# Patient Record
Sex: Male | Born: 1970 | Race: White | Hispanic: No | Marital: Married | Smoking: Former smoker
Health system: Southern US, Community
[De-identification: ages and names within clinical notes are randomized; demographics above are authoritative.]

## PROBLEM LIST (undated history)

## (undated) DIAGNOSIS — E785 Hyperlipidemia, unspecified: Secondary | ICD-10-CM

## (undated) DIAGNOSIS — D496 Neoplasm of unspecified behavior of brain: Secondary | ICD-10-CM

## (undated) DIAGNOSIS — I10 Essential (primary) hypertension: Secondary | ICD-10-CM

## (undated) DIAGNOSIS — G4730 Sleep apnea, unspecified: Secondary | ICD-10-CM

## (undated) DIAGNOSIS — M75100 Unspecified rotator cuff tear or rupture of unspecified shoulder, not specified as traumatic: Secondary | ICD-10-CM

## (undated) HISTORY — PX: ABDOMINAL SURGERY: SHX537

## (undated) HISTORY — PX: CHOLECYSTECTOMY: SHX55

## (undated) HISTORY — PX: ROTATOR CUFF REPAIR: SHX139

## (undated) HISTORY — PX: SINUS SURGERY WITH INSTATRAK: SHX5215

## (undated) HISTORY — PX: BRAIN SURGERY: SHX531

---

## 2001-08-12 ENCOUNTER — Emergency Department (HOSPITAL_COMMUNITY): Admission: EM | Admit: 2001-08-12 | Discharge: 2001-08-12 | Payer: Self-pay | Admitting: *Deleted

## 2001-08-12 ENCOUNTER — Encounter: Payer: Self-pay | Admitting: Emergency Medicine

## 2001-08-15 ENCOUNTER — Emergency Department (HOSPITAL_COMMUNITY): Admission: EM | Admit: 2001-08-15 | Discharge: 2001-08-15 | Payer: Self-pay | Admitting: Emergency Medicine

## 2001-08-18 ENCOUNTER — Encounter: Payer: Self-pay | Admitting: Emergency Medicine

## 2001-08-18 ENCOUNTER — Ambulatory Visit (HOSPITAL_COMMUNITY): Admission: RE | Admit: 2001-08-18 | Discharge: 2001-08-18 | Payer: Self-pay | Admitting: Emergency Medicine

## 2001-12-20 ENCOUNTER — Emergency Department (HOSPITAL_COMMUNITY): Admission: EM | Admit: 2001-12-20 | Discharge: 2001-12-20 | Payer: Self-pay | Admitting: Emergency Medicine

## 2002-02-15 ENCOUNTER — Emergency Department (HOSPITAL_COMMUNITY): Admission: EM | Admit: 2002-02-15 | Discharge: 2002-02-15 | Payer: Self-pay | Admitting: Emergency Medicine

## 2002-04-21 ENCOUNTER — Emergency Department (HOSPITAL_COMMUNITY): Admission: EM | Admit: 2002-04-21 | Discharge: 2002-04-21 | Payer: Self-pay | Admitting: Emergency Medicine

## 2003-02-25 ENCOUNTER — Encounter: Payer: Self-pay | Admitting: Emergency Medicine

## 2003-02-25 ENCOUNTER — Emergency Department (HOSPITAL_COMMUNITY): Admission: EM | Admit: 2003-02-25 | Discharge: 2003-02-25 | Payer: Self-pay | Admitting: Emergency Medicine

## 2003-03-05 ENCOUNTER — Encounter: Payer: Self-pay | Admitting: Orthopaedic Surgery

## 2003-03-05 ENCOUNTER — Ambulatory Visit (HOSPITAL_COMMUNITY): Admission: RE | Admit: 2003-03-05 | Discharge: 2003-03-05 | Payer: Self-pay | Admitting: Orthopaedic Surgery

## 2003-05-05 ENCOUNTER — Emergency Department (HOSPITAL_COMMUNITY): Admission: EM | Admit: 2003-05-05 | Discharge: 2003-05-05 | Payer: Self-pay | Admitting: Emergency Medicine

## 2003-05-10 ENCOUNTER — Emergency Department (HOSPITAL_COMMUNITY): Admission: EM | Admit: 2003-05-10 | Discharge: 2003-05-11 | Payer: Self-pay | Admitting: Emergency Medicine

## 2003-05-15 ENCOUNTER — Emergency Department (HOSPITAL_COMMUNITY): Admission: EM | Admit: 2003-05-15 | Discharge: 2003-05-15 | Payer: Self-pay | Admitting: *Deleted

## 2003-05-15 ENCOUNTER — Inpatient Hospital Stay (HOSPITAL_COMMUNITY): Admission: AD | Admit: 2003-05-15 | Discharge: 2003-05-20 | Payer: Self-pay | Admitting: General Surgery

## 2003-05-15 ENCOUNTER — Encounter: Payer: Self-pay | Admitting: *Deleted

## 2003-05-16 ENCOUNTER — Encounter: Payer: Self-pay | Admitting: General Surgery

## 2003-05-31 ENCOUNTER — Encounter: Payer: Self-pay | Admitting: Otolaryngology

## 2003-05-31 ENCOUNTER — Ambulatory Visit (HOSPITAL_COMMUNITY): Admission: RE | Admit: 2003-05-31 | Discharge: 2003-05-31 | Payer: Self-pay | Admitting: Otolaryngology

## 2003-07-27 ENCOUNTER — Emergency Department (HOSPITAL_COMMUNITY): Admission: EM | Admit: 2003-07-27 | Discharge: 2003-07-28 | Payer: Self-pay | Admitting: Emergency Medicine

## 2003-08-16 ENCOUNTER — Emergency Department (HOSPITAL_COMMUNITY): Admission: EM | Admit: 2003-08-16 | Discharge: 2003-08-16 | Payer: Self-pay | Admitting: Emergency Medicine

## 2003-08-24 ENCOUNTER — Emergency Department (HOSPITAL_COMMUNITY): Admission: EM | Admit: 2003-08-24 | Discharge: 2003-08-24 | Payer: Self-pay | Admitting: Emergency Medicine

## 2003-09-28 ENCOUNTER — Emergency Department (HOSPITAL_COMMUNITY): Admission: EM | Admit: 2003-09-28 | Discharge: 2003-09-29 | Payer: Self-pay | Admitting: *Deleted

## 2003-11-10 ENCOUNTER — Emergency Department (HOSPITAL_COMMUNITY): Admission: EM | Admit: 2003-11-10 | Discharge: 2003-11-10 | Payer: Self-pay | Admitting: *Deleted

## 2004-02-02 ENCOUNTER — Emergency Department (HOSPITAL_COMMUNITY): Admission: EM | Admit: 2004-02-02 | Discharge: 2004-02-02 | Payer: Self-pay | Admitting: Emergency Medicine

## 2004-02-22 ENCOUNTER — Emergency Department (HOSPITAL_COMMUNITY): Admission: EM | Admit: 2004-02-22 | Discharge: 2004-02-22 | Payer: Self-pay | Admitting: Emergency Medicine

## 2004-03-28 ENCOUNTER — Emergency Department (HOSPITAL_COMMUNITY): Admission: EM | Admit: 2004-03-28 | Discharge: 2004-03-28 | Payer: Self-pay | Admitting: Emergency Medicine

## 2004-04-06 ENCOUNTER — Emergency Department (HOSPITAL_COMMUNITY): Admission: EM | Admit: 2004-04-06 | Discharge: 2004-04-06 | Payer: Self-pay | Admitting: Emergency Medicine

## 2004-04-07 ENCOUNTER — Emergency Department (HOSPITAL_COMMUNITY): Admission: EM | Admit: 2004-04-07 | Discharge: 2004-04-08 | Payer: Self-pay | Admitting: Emergency Medicine

## 2004-05-09 ENCOUNTER — Emergency Department (HOSPITAL_COMMUNITY): Admission: EM | Admit: 2004-05-09 | Discharge: 2004-05-09 | Payer: Self-pay | Admitting: Emergency Medicine

## 2004-07-13 ENCOUNTER — Emergency Department (HOSPITAL_COMMUNITY): Admission: EM | Admit: 2004-07-13 | Discharge: 2004-07-13 | Payer: Self-pay | Admitting: Emergency Medicine

## 2004-08-29 ENCOUNTER — Ambulatory Visit (HOSPITAL_COMMUNITY): Admission: RE | Admit: 2004-08-29 | Discharge: 2004-08-29 | Payer: Self-pay | Admitting: Specialist

## 2004-09-30 ENCOUNTER — Emergency Department (HOSPITAL_COMMUNITY): Admission: EM | Admit: 2004-09-30 | Discharge: 2004-09-30 | Payer: Self-pay | Admitting: Emergency Medicine

## 2006-06-03 ENCOUNTER — Emergency Department (HOSPITAL_COMMUNITY): Admission: EM | Admit: 2006-06-03 | Discharge: 2006-06-03 | Payer: Self-pay | Admitting: Family Medicine

## 2007-11-19 ENCOUNTER — Emergency Department (HOSPITAL_COMMUNITY): Admission: EM | Admit: 2007-11-19 | Discharge: 2007-11-19 | Payer: Self-pay | Admitting: Emergency Medicine

## 2008-02-22 ENCOUNTER — Emergency Department (HOSPITAL_COMMUNITY): Admission: EM | Admit: 2008-02-22 | Discharge: 2008-02-22 | Payer: Self-pay | Admitting: Emergency Medicine

## 2008-08-26 ENCOUNTER — Encounter: Payer: Self-pay | Admitting: Orthopedic Surgery

## 2008-08-26 ENCOUNTER — Emergency Department (HOSPITAL_COMMUNITY): Admission: EM | Admit: 2008-08-26 | Discharge: 2008-08-26 | Payer: Self-pay | Admitting: Emergency Medicine

## 2008-08-27 ENCOUNTER — Telehealth: Payer: Self-pay | Admitting: Orthopedic Surgery

## 2008-08-27 ENCOUNTER — Ambulatory Visit: Payer: Self-pay | Admitting: Orthopedic Surgery

## 2008-08-30 ENCOUNTER — Telehealth: Payer: Self-pay | Admitting: Orthopedic Surgery

## 2008-08-31 ENCOUNTER — Ambulatory Visit (HOSPITAL_COMMUNITY): Admission: RE | Admit: 2008-08-31 | Discharge: 2008-08-31 | Payer: Self-pay | Admitting: Orthopedic Surgery

## 2008-09-05 ENCOUNTER — Ambulatory Visit: Payer: Self-pay | Admitting: Orthopedic Surgery

## 2008-09-05 DIAGNOSIS — IMO0002 Reserved for concepts with insufficient information to code with codable children: Secondary | ICD-10-CM

## 2008-09-05 DIAGNOSIS — M1710 Unilateral primary osteoarthritis, unspecified knee: Secondary | ICD-10-CM

## 2008-09-05 DIAGNOSIS — M25569 Pain in unspecified knee: Secondary | ICD-10-CM | POA: Insufficient documentation

## 2008-09-05 HISTORY — DX: Reserved for concepts with insufficient information to code with codable children: IMO0002

## 2008-11-02 ENCOUNTER — Emergency Department (HOSPITAL_COMMUNITY): Admission: EM | Admit: 2008-11-02 | Discharge: 2008-11-02 | Payer: Self-pay | Admitting: Emergency Medicine

## 2009-01-09 ENCOUNTER — Emergency Department (HOSPITAL_COMMUNITY): Admission: EM | Admit: 2009-01-09 | Discharge: 2009-01-09 | Payer: Self-pay | Admitting: Emergency Medicine

## 2009-03-03 ENCOUNTER — Emergency Department (HOSPITAL_COMMUNITY): Admission: EM | Admit: 2009-03-03 | Discharge: 2009-03-03 | Payer: Self-pay | Admitting: Emergency Medicine

## 2009-06-21 ENCOUNTER — Emergency Department (HOSPITAL_COMMUNITY): Admission: EM | Admit: 2009-06-21 | Discharge: 2009-06-21 | Payer: Self-pay | Admitting: Emergency Medicine

## 2009-11-21 ENCOUNTER — Encounter: Payer: Self-pay | Admitting: Orthopedic Surgery

## 2010-01-19 ENCOUNTER — Emergency Department (HOSPITAL_COMMUNITY): Admission: EM | Admit: 2010-01-19 | Discharge: 2010-01-19 | Payer: Self-pay | Admitting: Emergency Medicine

## 2010-01-25 IMAGING — CR DG KNEE COMPLETE 4+V*L*
4 series · 4 of 4 positions shown · non-contrast
Comparison: None

CLINICAL DATA: Left knee pain and swelling, no trauma

LEFT KNEE - COMPLETE 4+ VIEW

[view not recorded (1 of 4)]
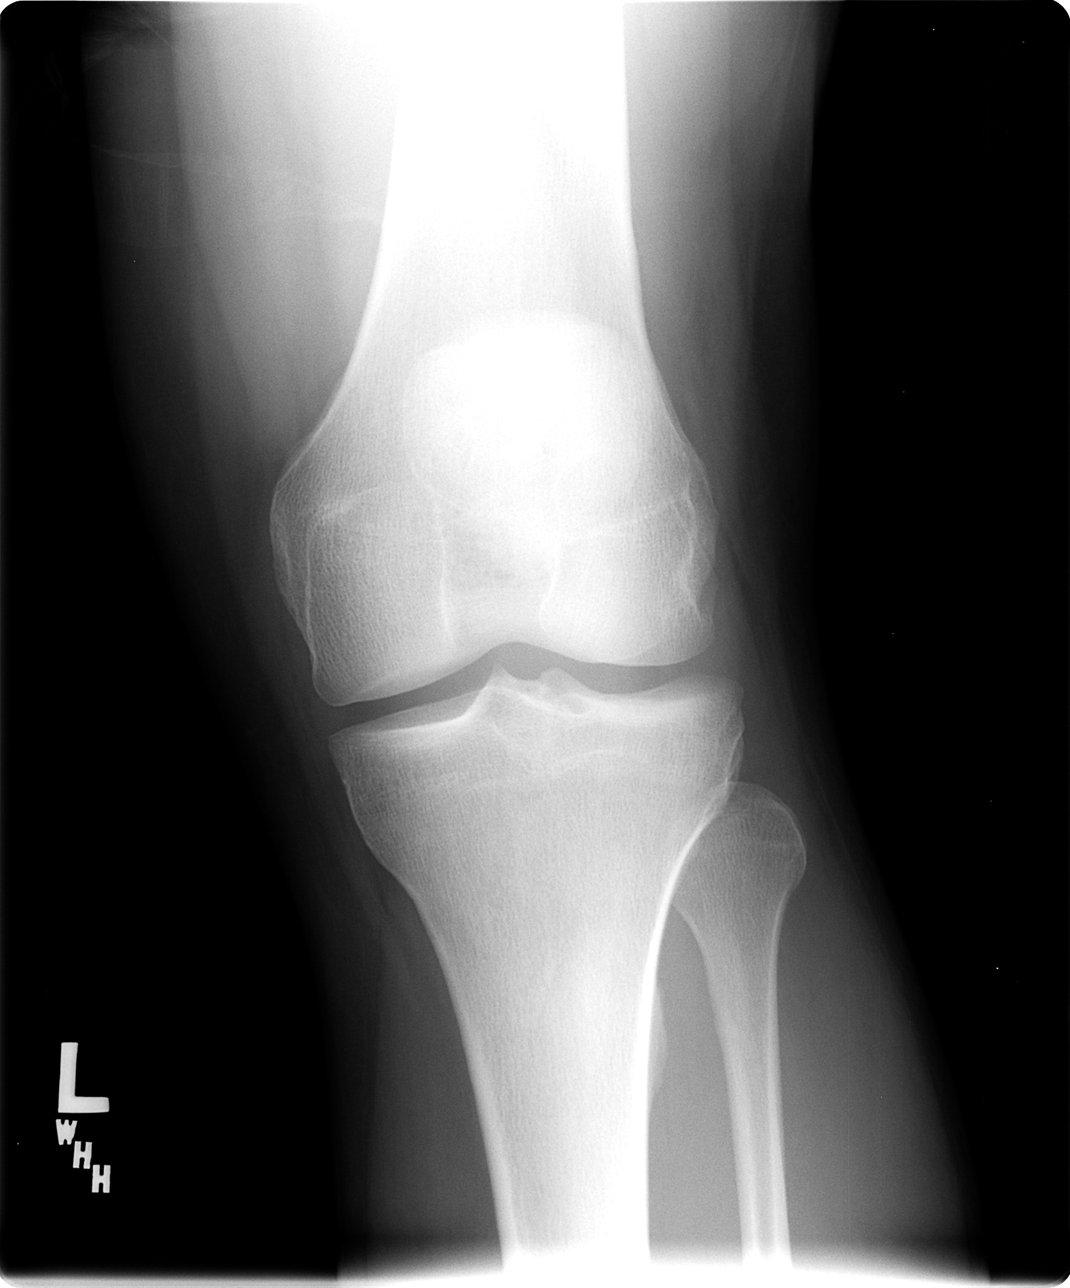

[view not recorded (2 of 4)]
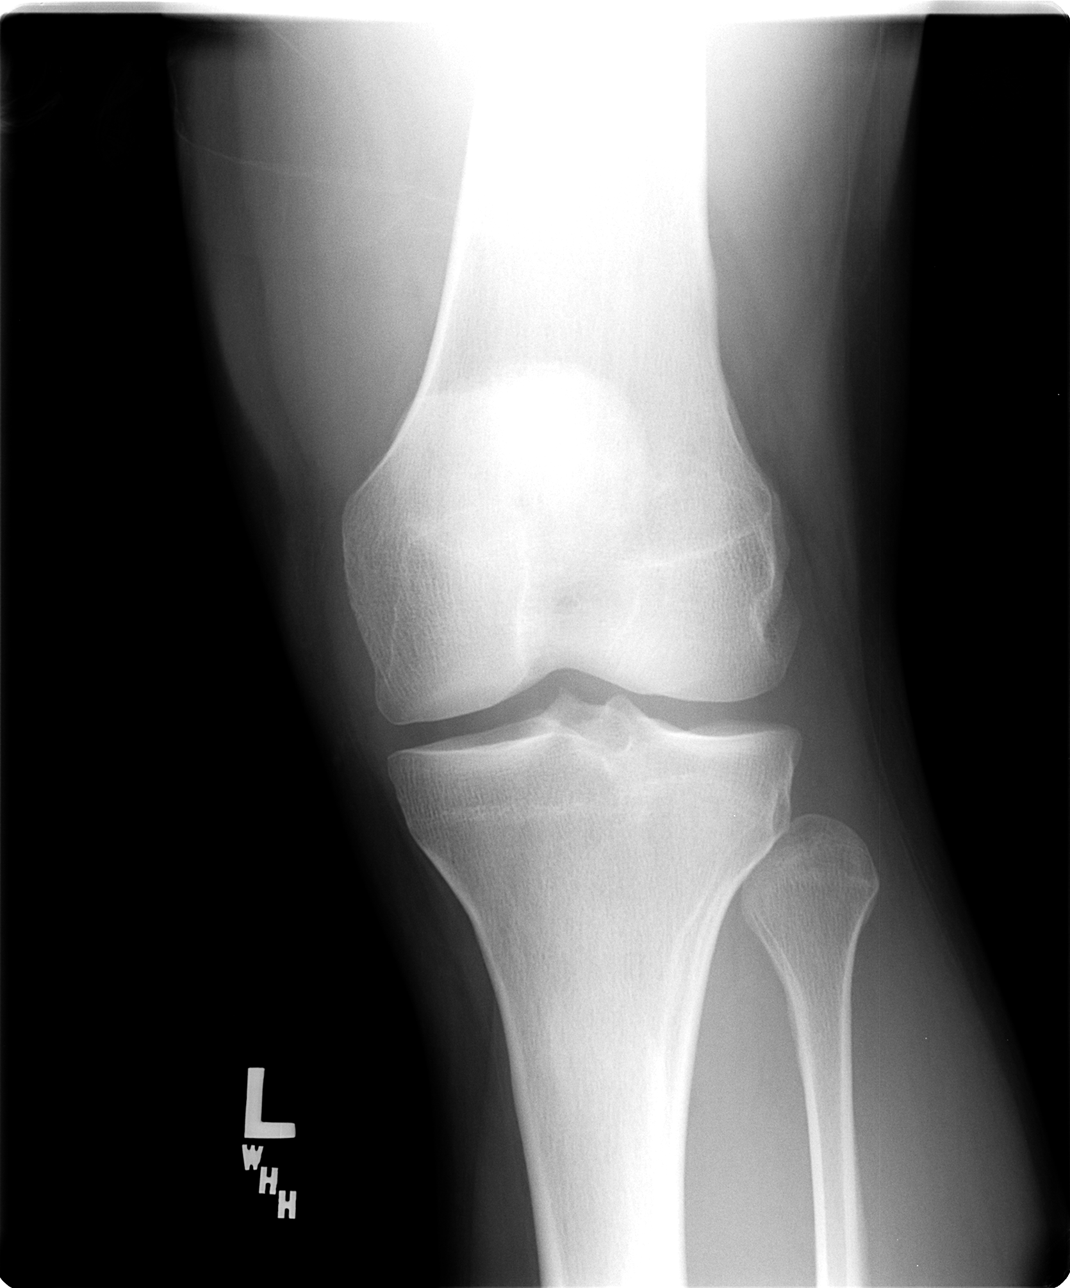

[view not recorded (3 of 4)]
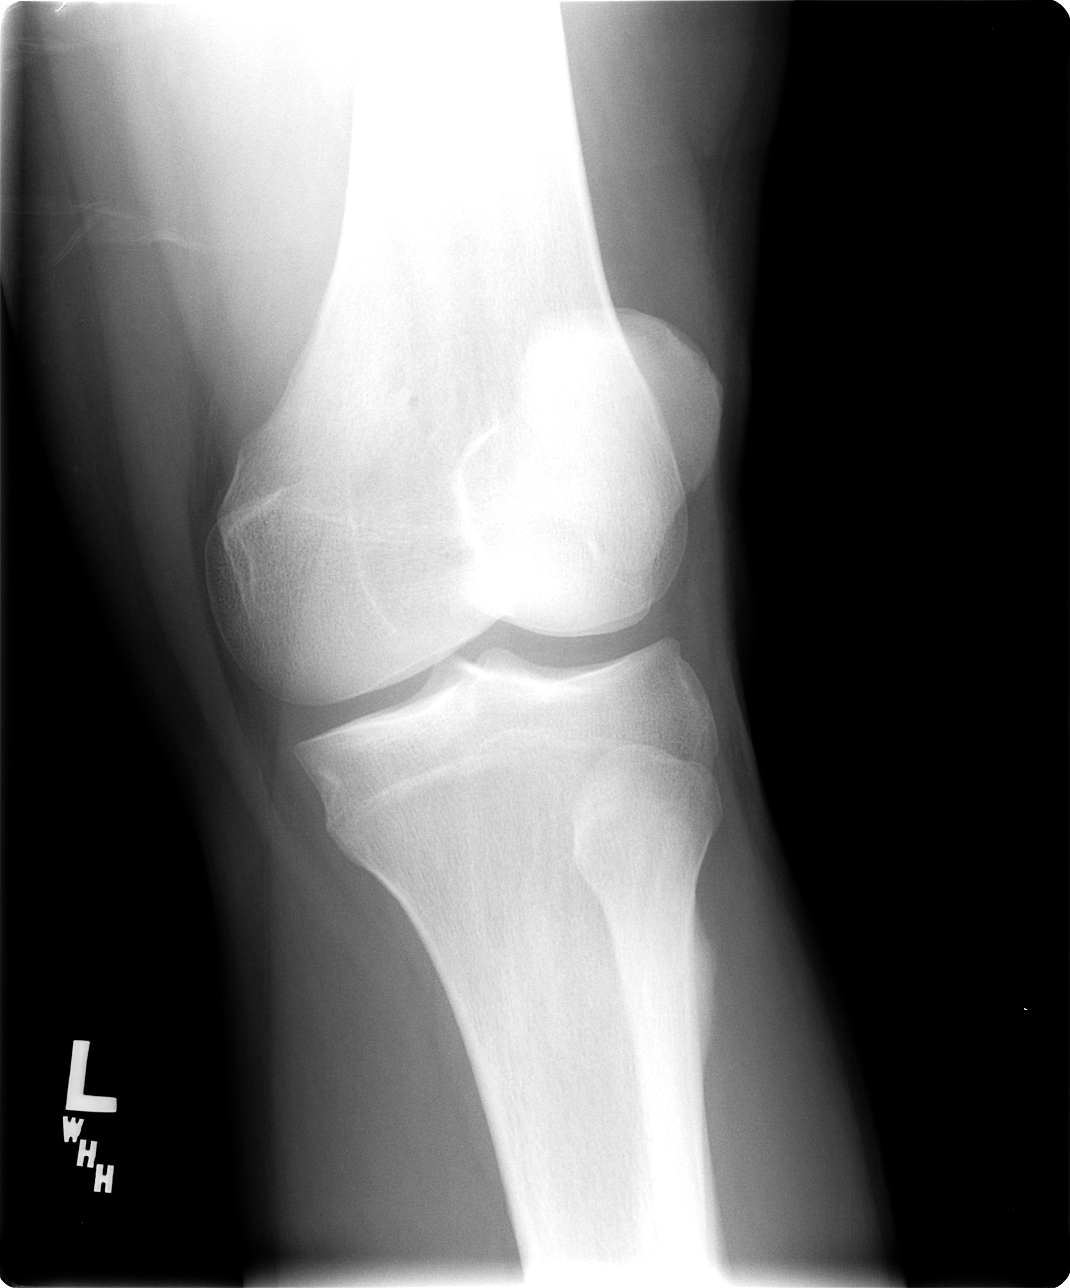

[view not recorded (4 of 4)]
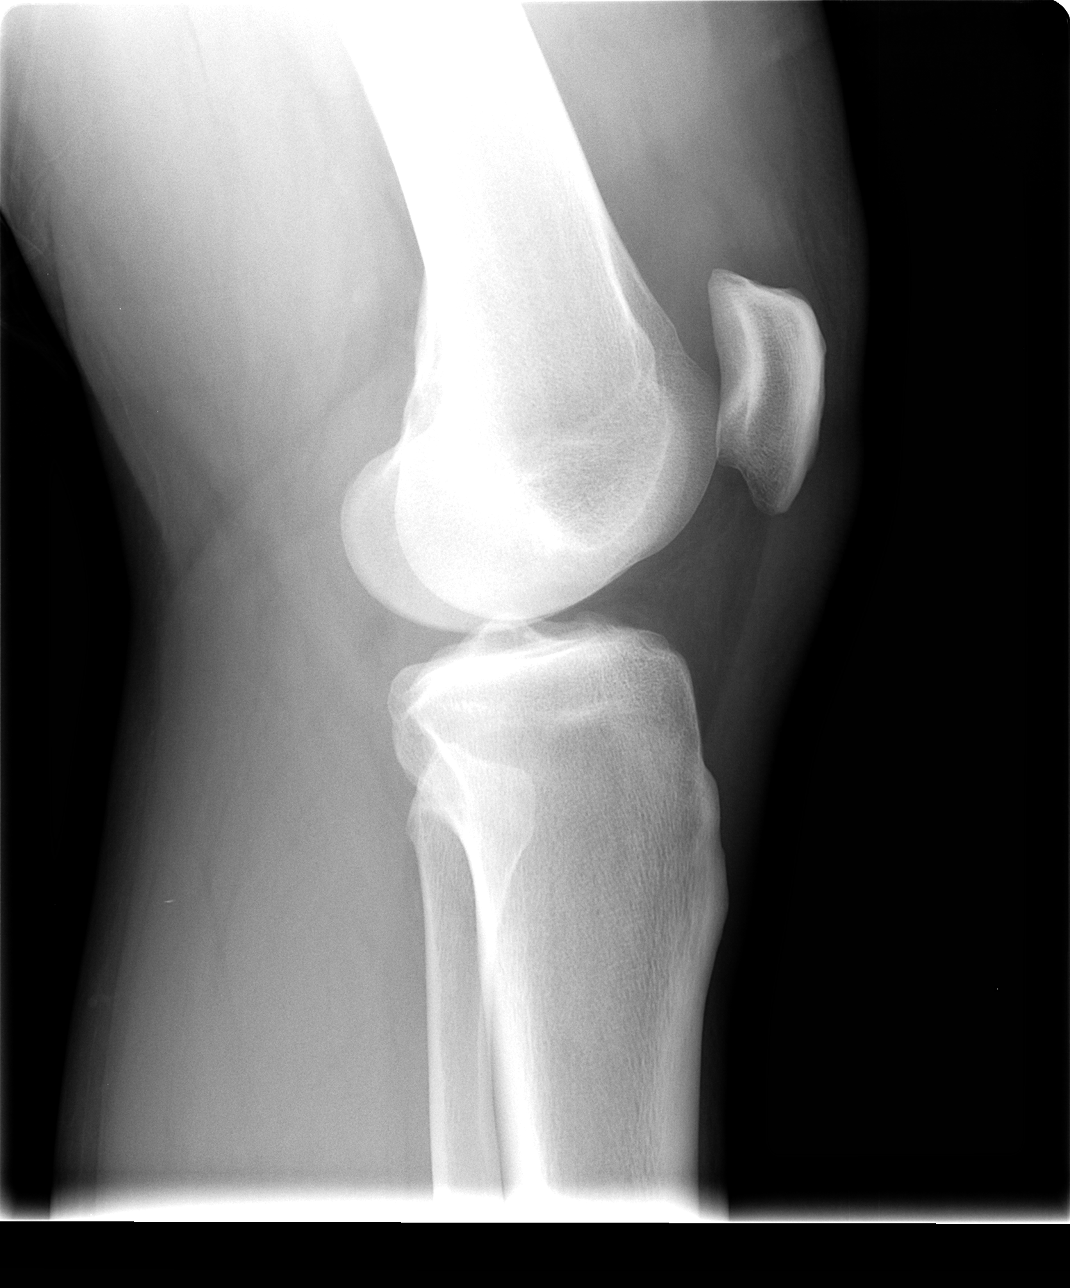

[4 of 4 positions shown; findings below may reference images not displayed]

FINDINGS: There is no evidence of fracture, dislocation, or joint
effusion.  There is no evidence of arthropathy or other focal bone
abnormality.  Soft tissues are unremarkable.Trace suprapatellar
fluid is incidentally noted.
IMPRESSION: Negative.

## 2010-03-14 ENCOUNTER — Emergency Department (HOSPITAL_COMMUNITY)
Admission: EM | Admit: 2010-03-14 | Discharge: 2010-03-14 | Payer: Self-pay | Source: Home / Self Care | Admitting: Emergency Medicine

## 2010-07-02 ENCOUNTER — Ambulatory Visit (HOSPITAL_COMMUNITY): Admission: RE | Admit: 2010-07-02 | Discharge: 2010-07-02 | Payer: Self-pay | Admitting: Internal Medicine

## 2010-11-02 ENCOUNTER — Encounter: Payer: Self-pay | Admitting: Internal Medicine

## 2010-11-11 NOTE — Letter (Signed)
Summary: *Orthopedic No Show Letter  [facility]  [address] [place] [address]  [place], [place] [address]   Phone: [phone]  Fax: [phone]     11/21/2009   Traci Plemons [address] [place], [place]  [address]   Dear Mr. ALCOCER,   Our records indicate that you missed your scheduled appointment with Dr. Beaulah Corin on 11/14/2009.  Please contact this office to reschedule your appointment as soon as possible.  It is important that you keep your scheduled appointments with your physician, so we can provide you the best care possible.  We have enclosed an appointment card for your convenience.      Sincerely,    Dr. Terrance Mass, MD [facility] Phone [phone]

## 2010-12-29 LAB — BASIC METABOLIC PANEL
BUN: 12 mg/dL (ref 6–23)
CO2: 26 mEq/L (ref 19–32)
GFR calc Af Amer: >60 mL/min (ref >60)
GFR calc non Af Amer: >60 mL/min (ref >60)
Potassium: 4.1 mEq/L (ref 3.5–5.1)
Sodium: 137 mEq/L (ref 135–145)

## 2010-12-29 LAB — CBC
HCT: 45.6 % (ref 39.0–52.0)
WBC: 10.2 10*3/uL (ref 4.0–10.5)

## 2010-12-29 LAB — DIFFERENTIAL
Basophils Absolute: 0.1 10*3/uL (ref 0.0–0.1)
Eosinophils Absolute: 0.2 10*3/uL (ref 0.0–0.7)
Lymphs Abs: 2 10*3/uL (ref 0.7–4.0)

## 2010-12-31 LAB — URINALYSIS, ROUTINE W REFLEX MICROSCOPIC
Glucose, UA: NEGATIVE mg/dL
Leukocytes, UA: NEGATIVE
Protein, ur: NEGATIVE mg/dL
Specific Gravity, Urine: <1.005 — ABNORMAL LOW (ref 1.005–1.030)
Urobilinogen, UA: 0.2 mg/dL (ref 0.0–1.0)
pH: 5 (ref 5.0–8.0)

## 2010-12-31 LAB — URINE MICROSCOPIC-ADD ON

## 2011-01-03 ENCOUNTER — Emergency Department (HOSPITAL_COMMUNITY): Payer: Self-pay

## 2011-01-03 ENCOUNTER — Emergency Department (HOSPITAL_COMMUNITY)
Admission: EM | Admit: 2011-01-03 | Discharge: 2011-01-03 | Disposition: A | Discharge status: Home / Self Care | Payer: Self-pay | Attending: Emergency Medicine | Admitting: Emergency Medicine

## 2011-01-03 DIAGNOSIS — W1789XA Other fall from one level to another, initial encounter: Secondary | ICD-10-CM | POA: Insufficient documentation

## 2011-01-03 DIAGNOSIS — M25579 Pain in unspecified ankle and joints of unspecified foot: Secondary | ICD-10-CM | POA: Insufficient documentation

## 2011-01-03 DIAGNOSIS — S9030XA Contusion of unspecified foot, initial encounter: Secondary | ICD-10-CM | POA: Insufficient documentation

## 2011-01-22 LAB — URINALYSIS, ROUTINE W REFLEX MICROSCOPIC
Glucose, UA: NEGATIVE mg/dL
Nitrite: NEGATIVE
Specific Gravity, Urine: >1.03 — ABNORMAL HIGH (ref 1.005–1.030)
pH: 5.5 (ref 5.0–8.0)

## 2011-01-22 LAB — COMPREHENSIVE METABOLIC PANEL
ALT: 41 U/L (ref 0–53)
Albumin: 4.2 g/dL (ref 3.5–5.2)
Alkaline Phosphatase: 73 U/L (ref 39–117)
BUN: 16 mg/dL (ref 6–23)
Chloride: 107 mEq/L (ref 96–112)
Glucose, Bld: 89 mg/dL (ref 70–99)
Potassium: 3.5 mEq/L (ref 3.5–5.1)
Sodium: 142 mEq/L (ref 135–145)
Total Bilirubin: 0.5 mg/dL (ref 0.3–1.2)
Total Protein: 6.2 g/dL (ref 6.0–8.3)

## 2011-01-22 LAB — CBC
HCT: 45 % (ref 39.0–52.0)
Hemoglobin: 15.6 g/dL (ref 13.0–17.0)
RBC: 5.11 MIL/uL (ref 4.22–5.81)
WBC: 6.9 10*3/uL (ref 4.0–10.5)

## 2011-01-22 LAB — DIFFERENTIAL
Basophils Absolute: 0.1 10*3/uL (ref 0.0–0.1)
Basophils Relative: 1 % (ref 0–1)
Eosinophils Absolute: 0.1 10*3/uL (ref 0.0–0.7)
Monocytes Absolute: 0.4 10*3/uL (ref 0.1–1.0)
Monocytes Relative: 6 % (ref 3–12)
Neutro Abs: 4.2 10*3/uL (ref 1.7–7.7)
Neutrophils Relative %: 60 % (ref 43–77)

## 2011-02-27 NOTE — Discharge Summary (Signed)
NAME:  Jose Little, Jose Little                         ACCOUNT NO.:  [id number]   MEDICAL RECORD NO.:  [id number]                   PATIENT TYPE:   LOCATION:                                       FACILITY:  [facility]   PHYSICIAN:  Barbaraann Barthel, M.D.              DATE OF BIRTH:  Feb 19, 1971   DATE OF ADMISSION:  DATE OF DISCHARGE:                                 DISCHARGE SUMMARY   PROCEDURES:  On May 18, 2003, laparoscopic cholecystectomy.   HISTORY OF PRESENT ILLNESS:  This is a 40 year old male who was seen twice  in the emergency room at [facility] for right upper quadrant pain  and was treated for gastritis.  He returned when he continued to have pain,  and a CT scan revealed that he had cholelithiasis.  He was admitted with  obvious cholecystitis-like symptoms.  A sonogram was performed, and it  showed a very thickened gallbladder with normal liver function studies and  amylase.  I treated him with hydration and antibiotics allowing the  inflammation to subside, and we took him to surgery on May 18, 2003.  At  that time, he was found to have a very edematous, thickened gallbladder,  which required decompression prior to laparoscopic cholecystectomy.  He was  also found to have multiple stones within the gallbladder and no other  abnormalities other than what pathology later revealed to be acute and  chronic cholecystitis.   HOSPITAL COURSE:  Fairly unremarkable.  He did well postoperatively.  He was  treated with antibiotics in the postoperative period.  His diet and activity  was advanced as tolerated.  He had his Jackson-Pratt drainage diminished so  that it was minimal or less than 30 cc on the second postoperative day at  which time the drain was removed and he was discharged.  At the time of  discharge, his wound was clean.  He was tolerating p.o. well.  He had no  dysuria, and otherwise he was doing well from his surgery.   LABORATORY DATA:  Pathology revealed acute  and chronic cholecystitis.  His  white count on May 15, 2003, was 60 with an H&H of 14.3 and 41.6.  His  electrolytes were within normal limits and liver function studies were also  within normal limits.  Amylase was not elevated.  Sonogram revealed acute  cholecystitis with a normal comon bile duct.  The cystic duct was small and  short and not cannulated for cholangiogram intraoperatively.  No other  abnormalities were noted other than a small hemangioma noted in the right  lobe of the liver.   DISCHARGE INSTRUCTIONS:  The patient did quite well postoperatively and he  was discharged with instructions that include discharge instructions.  He  was excused from work.  Told full liquid and soft diet.  He was permitted to  shower.  No sexual activity.  He is permitted to walk  up and down the  stairs.  No driving and no heavy  lifting.  He was told to clean his wound with alcohol three times a day,  take no aspirin products, take Darvocet N-100 one tablet every four hours as  needed for pain and continue Cipro 500 mg two times a day for an additional  three days.  We will follow him perioperatively.     ___________________________________________                                         Barbaraann Barthel, M.D.   WB/MEDQ  D:  08/07/2003  T:  08/07/2003  Job:  [id number]   cc:   Ilean Skill, M.D.  Emergency Room

## 2011-02-27 NOTE — Op Note (Signed)
NAME:  Jose Little, Jose Little NO.:  [id number]   MEDICAL RECORD NO.:  [id number]          PATIENT TYPE:  AMB   LOCATION:  DAY                          FACILITY:  [facility]   PHYSICIAN:  Jene Every, M.D.    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   OPERATION PERFORMED:  Right shoulder arthroscopy, subacromial decompression,  bursectomy.  Arthroscopically assisted distal clavicle resection.   SURGEON:  Jene Every, M.D.   ASSISTANT:  Roma Schanz, P.A.   ANESTHESIA:  General.   INDICATIONS FOR PROCEDURE:  The patient is a 40 year old with refractory  shoulder pain, acromioclavicular arthrosis, positive impingement sign,  positive cross-over maneuver.  He had been refractory to conservative  treatment including corticosteroid injections, anti-inflammatories and  injection in the clavicle.  Operative intervention is indicated for  decompression by subacromial bursectomy and arthroscopic versus open distal  clavicle resection.  The risks and benefits were discussed including  bleeding, infection, damage to neurovascular structures, suboptimal range of  motion, need for revision.   DESCRIPTION OF PROCEDURE:  The patient was placed supine in beach chair  position.  After  general anesthesia, 1 g of Kefzol, the right shoulder and  upper extremity were prepped and draped in the usual sterile fashion.  Surgical markers were utilized to delineate the acromion, coracoid and  acromioclavicular joint.  Standard posterolateral incision was made for a  posterior portal through the skin only.  The arm in 70-30 position the  arthroscopic cannula blunt was then  directed into subacromial into the  glenohumeral joint toward the coracoid penetrating it atraumatically.  Anterolateral incision was then made through the skin only near the  anterolateral aspect of the acromion.  The blunt cannula was directed into  the glenohumeral joint under direct visualization __________ biceps tendon.  Noted was a normal glenohumeral joint though there was some fraying of the  anterior labrum, and a probe was introduced.  Utilizing a probe, the  anterior labrum, there was no evidence of detachment.  Mild degenerative  change of the glenoid was noted.  This was shaved with a 42 Cuda shaver.  Rotator cuff was unremarkable as was the subscap and the biceps tendon.  Next, the camera was redirected in the subacromial space.  The lateral  portal was attached to the skin and the cannula was then advanced into the  subacromial space.  Hypertrophic and exuberant bursitis was noted.  Collene Mares  was introduced and utilized to perform a full bursectomy.  An ArthroWand was  introduced and utilized to detach the CA ligament.  This was secured to the  acromioclavicular joint which demarcated with an 18 gauge needle.  There was  no significant impingement over the anterior aspect of the acromion.  At the  acromioclavicular joint, however, there was hypertrophic and exuberant  bursa, infolded inflammatory tissue there.  The  distal clavicle was  skeletonized with the ArthroWand anteriorly, posteriorly beneath the  clavicle for a distance of about 1 cm.  There was a spur that was directed  towards the acromion.  The clavicle was delivered into the subacromial space  and the acromionizer introduced through the medial lateral portal and  utilized to perform a distal clavicle resection about 7 mm in depth.  There  was one small portion on the cephalad portion of the clavicle that required  an approach from anteriorly.  There was no impingement following the  resection of the distal  clavicle.  I inspected the rotator cuff, no evidence  of a tear.  Full bursectomy had been performed.  CA ligament had been  released.  There was ample subacromial space.  Next after removal of all  arthroscopic equipment, I made a 1.5 cm incision over the top of the [facility]  joint, divided the fascia and the capsule over the CA ligament.  Then I  subperiosteally elevated the capsule off the most cephalad portion of that  and removed that with a 20 mm Kerrison rongeur, placed bone wax on the  distal clavicle.  I was able to palpate it digitally.  There was excellent  space no impingement noted.  After I copiously irrigated with antibiotic  irrigation, I closed the capsule with #1 Vicryl interrupted figure-of-eight  sutures, subcutaneous tissue reapproximated with 2-0 Vicryl simple sutures  as was the dorsal clavicular fascia.  Subcutaneous tissue reapproximated  with 4-0 subcuticular Prolene.  Portals were closed with 4-0 nylon simple  sutures.  Wound was dressed sterilely.  He was placed in abduction pillow,  extubated without difficulty after sterile dressing was applied and  transported to recovery in satisfactory condition.  The patient tolerated  the procedure well without complications.     Trey Paula   JB/MEDQ  D:  08/29/2004  T:  08/30/2004  Job:  [id number]

## 2011-02-27 NOTE — Op Note (Signed)
NAME:  Jose Little, Jose Little                         ACCOUNT NO.:  [id number]   MEDICAL RECORD NO.:  [id number]                   PATIENT TYPE:  INP   LOCATION:  A309                                 FACILITY:  [facility]   PHYSICIAN:  Barbaraann Barthel, M.D.              DATE OF BIRTH:  06/23/1971   DATE OF PROCEDURE:  05/18/2003  DATE OF DISCHARGE:                                 OPERATIVE REPORT   PREOPERATIVE DIAGNOSIS:  Acute cholecystitis with cholelithiasis.   POSTOPERATIVE DIAGNOSIS:  Acute cholecystitis with cholelithiasis.   PROCEDURE:  Laparoscopic cholecystectomy.   SPECIMENS:  Gallbladder with stones.   INDICATIONS FOR PROCEDURE:  Note this is a 40 year old male who was seen  twice in the emergency room at [facility] for right upper quadrant  and abdominal pain. He was treated essentially for gastritis. He returned  when he continued to have pain and a CT scan revealed that he had  cholelithiasis. He was admitted with obvious acute cholecystitis. A sonogram  was performed and showed a very thickened gallbladder with normal liver  function studies and amylase. I elected to hydrate him, begin antibiotics  and over the next day his pain subsided and we took him to surgery on May 18, 2003. Prior to surgery, I discussed complications with him not limited to  but including bleeding, infection, damage to bile ducts, perforation or  organs and transitory diarrhea. I also discussed we may have to do an open  procedure. Informed consent was obtained.   GROSS FINDINGS:  Edema, very thickened gallbladder which required  decompression. A short cystic duct which was not cannulated. Multiple stones  within the gallbladder. The right upper quadrant otherwise appeared to be  normal.   TECHNIQUE:  The patient was placed in a supine position and after the  adequate administration of general anesthesia by endotracheal intubation,  his entire abdomen was prepped with Betadine solution  and draped in the  usual manner. A Foley catheter was aseptically inserted. A periumbilical  incision was carried out at the superior aspect of the umbilicus, the fascia  was grasped with a sharp towel clip and then a Veress needle was inserted  with the patient in Trendelenburg position. This was confirmed position with  a saline drop test. We then insufflated the abdomen with approximately 3.5-  3.7 liters of CO2 and then using the Visiport technique, an 11 mm cannula  was placed and then under direct vision an 11 mm cannula was placed in the  epigastrium and two 5 mm cannulas in the right upper quadrant laterally. The  gallbladder was grasped, it was hard to grab because of its thickened  edematous state. We decompressed the gallbladder of case-like bile and then  we were able to grasp it a little easier. We dissected from the fundus down  towards the cystic duct cautiously identifying the cystic duct taking the  inflamed adhesions around  from it. The cystic duct was triply silver clipped  distally and proximally, singly sliver clipped. The cystic artery was  likewise identified and triply silver clipped. The gallbladder was then  removed using the hook cautery device from the liver bed. We irrigated this  and made sure that hemostasis was adequate and I elected to leave some  Surgicel within the liver bed. I also elected to leave a Jackson-Pratt drain  due to the acute inflammation in the liver bed. This excited through the  lateral 5 mm port side. After checking for hemostasis and deeming this  complete and no other problems noted, the abdomen was then desufflated, I  closed the fascia in the area of the epigastrium with #0 Polysorb. This was  closed with figure-of-eight sutures. This incision was opened a little  further in order to allow a very thickened gallbladder to be exited from the  abdominal cavity and I used the endosac device for that procedure. The  fascia was therefore  closed with figure-of-eight sutures in this area in  interrupted fashion, the fascia was closed likewise with #o Polysorb in the  area of the umbilicus. The rest of the skin was approximated with the  stapling device. The drain was sutured in place with 3-0 nylon. Prior to  closure, all sponge, needle and instrument counts were found to be correct.  Estimated blood loss was approximately 100 mL. The patient received 1800 mL  of crystalloids intraoperatively. There were no complications.                                               Barbaraann Barthel, M.D.    WB/MEDQ  D:  05/18/2003  T:  05/19/2003  Job:  [id number]   cc:   Ilean Skill, M.D., Emergency Room

## 2011-03-11 ENCOUNTER — Emergency Department (HOSPITAL_COMMUNITY): Payer: Self-pay

## 2011-03-11 ENCOUNTER — Encounter (HOSPITAL_COMMUNITY): Payer: Self-pay | Admitting: Radiology

## 2011-03-11 ENCOUNTER — Emergency Department (HOSPITAL_COMMUNITY)
Admission: EM | Admit: 2011-03-11 | Discharge: 2011-03-11 | Disposition: A | Discharge status: Home / Self Care | Payer: Self-pay | Attending: Emergency Medicine | Admitting: Emergency Medicine

## 2011-03-11 DIAGNOSIS — R509 Fever, unspecified: Secondary | ICD-10-CM | POA: Insufficient documentation

## 2011-03-11 DIAGNOSIS — F17200 Nicotine dependence, unspecified, uncomplicated: Secondary | ICD-10-CM | POA: Insufficient documentation

## 2011-03-14 ENCOUNTER — Emergency Department (HOSPITAL_COMMUNITY)
Admission: EM | Admit: 2011-03-14 | Discharge: 2011-03-15 | Disposition: A | Discharge status: Home / Self Care | Payer: Self-pay | Attending: Emergency Medicine | Admitting: Emergency Medicine

## 2011-03-14 DIAGNOSIS — R509 Fever, unspecified: Secondary | ICD-10-CM | POA: Insufficient documentation

## 2011-03-14 DIAGNOSIS — B9789 Other viral agents as the cause of diseases classified elsewhere: Secondary | ICD-10-CM | POA: Insufficient documentation

## 2011-03-14 LAB — RAPID STREP SCREEN (MED CTR MEBANE ONLY): Streptococcus, Group A Screen (Direct): NEGATIVE

## 2011-03-16 LAB — ROCKY MTN SPOTTED FVR AB, IGM-BLOOD: RMSF IgM: 0.14 IV (ref 0.00–0.89)

## 2011-03-18 ENCOUNTER — Emergency Department (HOSPITAL_COMMUNITY): Payer: Self-pay

## 2011-03-18 ENCOUNTER — Emergency Department (HOSPITAL_COMMUNITY)
Admission: EM | Admit: 2011-03-18 | Discharge: 2011-03-18 | Disposition: A | Discharge status: Home / Self Care | Payer: Self-pay | Attending: Emergency Medicine | Admitting: Emergency Medicine

## 2011-03-18 DIAGNOSIS — K5900 Constipation, unspecified: Secondary | ICD-10-CM | POA: Insufficient documentation

## 2011-03-18 DIAGNOSIS — F17200 Nicotine dependence, unspecified, uncomplicated: Secondary | ICD-10-CM | POA: Insufficient documentation

## 2011-03-18 DIAGNOSIS — R51 Headache: Secondary | ICD-10-CM | POA: Insufficient documentation

## 2011-03-18 DIAGNOSIS — K7689 Other specified diseases of liver: Secondary | ICD-10-CM | POA: Insufficient documentation

## 2011-03-18 DIAGNOSIS — R109 Unspecified abdominal pain: Secondary | ICD-10-CM | POA: Insufficient documentation

## 2011-03-18 LAB — COMPREHENSIVE METABOLIC PANEL
Albumin: 3.5 g/dL (ref 3.5–5.2)
Alkaline Phosphatase: 134 U/L — ABNORMAL HIGH (ref 39–117)
BUN: 17 mg/dL (ref 6–23)
CO2: 27 mEq/L (ref 19–32)
Chloride: 106 mEq/L (ref 96–112)
GFR calc non Af Amer: >60 mL/min (ref >60)
Glucose, Bld: 84 mg/dL (ref 70–99)
Potassium: 3.9 mEq/L (ref 3.5–5.1)
Total Bilirubin: 0.2 mg/dL — ABNORMAL LOW (ref 0.3–1.2)

## 2011-03-18 LAB — DIFFERENTIAL
Lymphocytes Relative: 66 % — ABNORMAL HIGH (ref 12–46)
Lymphs Abs: 6.4 10*3/uL — ABNORMAL HIGH (ref 0.7–4.0)
Monocytes Absolute: 1 10*3/uL (ref 0.1–1.0)
Monocytes Relative: 10 % (ref 3–12)
Neutro Abs: 2 10*3/uL (ref 1.7–7.7)
Neutrophils Relative %: 21 % — ABNORMAL LOW (ref 43–77)

## 2011-03-18 LAB — URINALYSIS, ROUTINE W REFLEX MICROSCOPIC
Bilirubin Urine: NEGATIVE
Glucose, UA: NEGATIVE mg/dL
Hgb urine dipstick: NEGATIVE
Specific Gravity, Urine: >1.03 — ABNORMAL HIGH (ref 1.005–1.030)

## 2011-03-18 LAB — CBC
HCT: 39 % (ref 39.0–52.0)
Hemoglobin: 13.5 g/dL (ref 13.0–17.0)
MCH: 29.3 pg (ref 26.0–34.0)
MCV: 84.6 fL (ref 78.0–100.0)
Platelets: 203 10*3/uL (ref 150–400)
RBC: 4.61 MIL/uL (ref 4.22–5.81)
WBC: 9.7 10*3/uL (ref 4.0–10.5)

## 2011-03-18 MED ORDER — IOHEXOL 300 MG/ML  SOLN: 100.0000 mL | Freq: Once | INTRAMUSCULAR | Status: DC | PRN

## 2012-05-12 ENCOUNTER — Emergency Department (HOSPITAL_COMMUNITY)
Admission: EM | Admit: 2012-05-12 | Discharge: 2012-05-13 | Disposition: A | Discharge status: Home / Self Care | Payer: Self-pay | Attending: Emergency Medicine | Admitting: Emergency Medicine

## 2012-05-12 ENCOUNTER — Encounter (HOSPITAL_COMMUNITY): Payer: Self-pay | Admitting: *Deleted

## 2012-05-12 ENCOUNTER — Emergency Department (HOSPITAL_COMMUNITY): Payer: Self-pay

## 2012-05-12 DIAGNOSIS — W268XXA Contact with other sharp object(s), not elsewhere classified, initial encounter: Secondary | ICD-10-CM | POA: Insufficient documentation

## 2012-05-12 DIAGNOSIS — Y998 Other external cause status: Secondary | ICD-10-CM | POA: Insufficient documentation

## 2012-05-12 DIAGNOSIS — F17200 Nicotine dependence, unspecified, uncomplicated: Secondary | ICD-10-CM | POA: Insufficient documentation

## 2012-05-12 DIAGNOSIS — T148XXA Other injury of unspecified body region, initial encounter: Secondary | ICD-10-CM

## 2012-05-12 DIAGNOSIS — S51009A Unspecified open wound of unspecified elbow, initial encounter: Secondary | ICD-10-CM | POA: Insufficient documentation

## 2012-05-12 DIAGNOSIS — Y9389 Activity, other specified: Secondary | ICD-10-CM | POA: Insufficient documentation

## 2012-05-12 HISTORY — DX: Neoplasm of unspecified behavior of brain: D49.6

## 2012-05-12 NOTE — ED Notes (Signed)
Lawn mower fell on rt arm , lac to elbow.  With swelling.

## 2012-05-12 NOTE — ED Provider Notes (Signed)
History     CSN: [id number]  Arrival date & time 05/12/12  2227   First MD Initiated Contact with Patient 05/12/12 2334      Chief Complaint  Patient presents with  . Arm Injury    (Consider location/radiation/quality/duration/timing/severity/associated sxs/prior treatment) HPI Pt reports he was working on his lawnmower a short time ago when the deck fell onto his R arm injuring his R elbow. He is complaining of moderate aching pain in the elbow associated with tingling in his forearm and weakness in his hands. His pain is worse with movement of hand.   Past Medical History  Diagnosis Date  . Brain tumor     Past Surgical History  Procedure Date  . Brain surgery   . Sinus surgery with instatrak   . Cholecystectomy   . Rotator cuff repair     History reviewed. No pertinent family history.  History  Substance Use Topics  . Smoking status: Current Everyday Smoker  . Smokeless tobacco: Not on file  . Alcohol Use: No      Review of Systems All other systems reviewed and are negative except as noted in HPI.   Allergies  Review of patient's allergies indicates no known allergies.  Home Medications  No current outpatient prescriptions on file.  BP 137/86  Pulse 81  Temp 98.6 F (37 C) (Oral)  Resp 18  Ht 5\' 9"  (1.753 m)  Wt 215 lb (97.523 kg)  BMI 31.75 kg/m2  SpO2 98%  Physical Exam  Constitutional: He is oriented to person, place, and time. He appears well-developed and well-nourished.  HENT:  Head: Normocephalic and atraumatic.  Neck: Neck supple.  Pulmonary/Chest: Effort normal.  Musculoskeletal: Normal range of motion.  Neurological: He is alert and oriented to person, place, and time. No cranial nerve deficit.       Paresthesias to medial R forearm, some decrease in strength of R hand compared to L  Skin:       1cm laceration to medial R elbow  Psychiatric: He has a normal mood and affect. His behavior is normal.    ED Course  Procedures  (including critical care time)  LACERATION REPAIR Performed by: Pollyann Savoy. Consent: Verbal consent obtained. Risks and benefits: risks, benefits and alternatives were discussed Patient identity confirmed: provided demographic data Time out performed prior to procedure Prepped and Draped in normal sterile fashion Wound explored  Laceration Location: R elbow  Laceration Length: 1cm  No Foreign Bodies seen or palpated  Anesthesia: local infiltration  Local anesthetic: lidocaine 1% no epinephrine  Anesthetic total: 1 ml  Irrigation method: syringe Amount of cleaning: standard  Skin closure: sutures, loose approximation  Number of sutures or staples: 1  Technique: simple  Patient tolerance: Patient tolerated the procedure well with no immediate complications.   Labs Reviewed - No data to display Dg Elbow Complete Right  05/12/2012  *RADIOLOGY REPORT*  Clinical Data: A lawn mower fell on arm.  Laceration.  Pain and limited range of motion.  RIGHT ELBOW - COMPLETE 3+ VIEW  Comparison: None.  Findings: There is no evidence for acute fracture or dislocation. No soft tissue foreign body or gas identified.  No joint effusion.  IMPRESSION: Negative exam.  Original Report Authenticated By: Patterson Hammersmith, M.D.     No diagnosis found.    MDM  Pt with puncture wound of elbow with some paresthesias. No definite nerve injury, but suspect some degree of contusion. Laceration was loosely approximated due to unclear  depth, it was copiously irrigated. Will start Abx, advised followup with Ortho if paresthesias have not resolved in 2-3 days.         Charles B. Bernette Mayers, MD 05/13/12 4098

## 2012-05-13 MED ORDER — LIDOCAINE HCL (PF) 1 % IJ SOLN
INTRAMUSCULAR | Status: AC
  Administered 2012-05-13
  Filled 2012-05-13: qty 5

## 2012-05-13 MED ORDER — BACITRACIN ZINC 500 UNIT/GM EX OINT
TOPICAL_OINTMENT | CUTANEOUS | Status: AC
  Administered 2012-05-13: 01:00:00
  Filled 2012-05-13: qty 0.9

## 2012-05-13 MED ORDER — CEPHALEXIN 500 MG PO CAPS
500.0000 mg | ORAL_CAPSULE | Freq: Once | ORAL | Status: AC
  Administered 2012-05-13: 500 mg via ORAL
  Filled 2012-05-13: qty 1

## 2012-05-13 MED ORDER — CEPHALEXIN 500 MG PO CAPS: 500.0000 mg | ORAL_CAPSULE | Freq: Four times a day (QID) | ORAL | Status: AC

## 2012-05-14 ENCOUNTER — Encounter (HOSPITAL_COMMUNITY): Payer: Self-pay | Admitting: *Deleted

## 2012-05-14 ENCOUNTER — Emergency Department (HOSPITAL_COMMUNITY)
Admission: EM | Admit: 2012-05-14 | Discharge: 2012-05-15 | Disposition: A | Discharge status: Home / Self Care | Payer: Self-pay | Attending: Emergency Medicine | Admitting: Emergency Medicine

## 2012-05-14 DIAGNOSIS — Z86011 Personal history of benign neoplasm of the brain: Secondary | ICD-10-CM | POA: Insufficient documentation

## 2012-05-14 DIAGNOSIS — M79603 Pain in arm, unspecified: Secondary | ICD-10-CM

## 2012-05-14 DIAGNOSIS — Z87828 Personal history of other (healed) physical injury and trauma: Secondary | ICD-10-CM | POA: Insufficient documentation

## 2012-05-14 DIAGNOSIS — F17200 Nicotine dependence, unspecified, uncomplicated: Secondary | ICD-10-CM | POA: Insufficient documentation

## 2012-05-14 DIAGNOSIS — M25529 Pain in unspecified elbow: Secondary | ICD-10-CM | POA: Insufficient documentation

## 2012-05-14 NOTE — ED Notes (Signed)
Pt reporting numbness in right arm.  Reports lawnmower fell on arm, was seen and treated 2 days ago.  Reporting arm numb, red and, swollen.

## 2012-05-15 LAB — CBC WITH DIFFERENTIAL/PLATELET
Basophils Absolute: 0 10*3/uL (ref 0.0–0.1)
Eosinophils Absolute: 0.2 10*3/uL (ref 0.0–0.7)
Eosinophils Relative: 2 % (ref 0–5)
Lymphocytes Relative: 36 % (ref 12–46)
MCV: 86.9 fL (ref 78.0–100.0)
Neutrophils Relative %: 55 % (ref 43–77)
Platelets: 187 10*3/uL (ref 150–400)
RDW: 13 % (ref 11.5–15.5)
WBC: 10 10*3/uL (ref 4.0–10.5)

## 2012-05-15 MED ORDER — ONDANSETRON 4 MG PO TBDP
4.0000 mg | ORAL_TABLET | Freq: Once | ORAL | Status: DC
  Filled 2012-05-15: qty 1

## 2012-05-15 MED ORDER — VANCOMYCIN HCL IN DEXTROSE 1-5 GM/200ML-% IV SOLN
1000.0000 mg | Freq: Once | INTRAVENOUS | Status: AC
  Administered 2012-05-15: 1000 mg via INTRAVENOUS
  Filled 2012-05-15: qty 200

## 2012-05-15 MED ORDER — OXYCODONE-ACETAMINOPHEN 5-325 MG PO TABS
2.0000 | ORAL_TABLET | Freq: Once | ORAL | Status: AC
  Administered 2012-05-15: 2 via ORAL
  Filled 2012-05-15: qty 1

## 2012-05-15 MED ORDER — OXYCODONE-ACETAMINOPHEN 5-325 MG PO TABS: 1.0000 | ORAL_TABLET | ORAL | Status: AC | PRN

## 2012-05-15 NOTE — ED Notes (Signed)
Patient states that he cut his arm 2 days ago and now is having numbness in his third and fourth fingers.  States he has had "pus" coming out of his stitches on his right arm.

## 2012-05-15 NOTE — ED Notes (Signed)
Wound on right arm cleaned and dressed with sterile nonadherent dressing and wrapped in a bulky dressing.

## 2012-05-15 NOTE — ED Provider Notes (Signed)
History     CSN: [id number]  Arrival date & time 05/14/12  2149   First MD Initiated Contact with Patient 05/15/12 0009      Chief Complaint  Patient presents with  . Numbness    (Consider location/radiation/quality/duration/timing/severity/associated sxs/prior treatment) HPI Jose Little is a 41 y.o. male who presents to the Emergency Department complaining of increased pain, swelling and numbness to left arm after a lawnmower fell on his arm 2 days ago. At that time he was seen and evaluated in the ER. Plain films of the arm did not reveal any foreign body. A laceration to the elbow was sutures. He has since developed increased swelling, erythema,pain with supination and pronation, and numbness, tingling and pain in the 4th and 5th fingers of the left hand. He has been taking his antibiotic, Keflex.   PCP Dr. Margo Aye Past Medical History  Diagnosis Date  . Brain tumor     Past Surgical History  Procedure Date  . Brain surgery   . Sinus surgery with instatrak   . Cholecystectomy   . Rotator cuff repair     History reviewed. No pertinent family history.  History  Substance Use Topics  . Smoking status: Current Everyday Smoker  . Smokeless tobacco: Not on file  . Alcohol Use: No      Review of Systems  Constitutional: Negative for fever.       10 Systems reviewed and are negative for acute change except as noted in the HPI.  HENT: Negative for congestion.   Eyes: Negative for discharge and redness.  Respiratory: Negative for cough and shortness of breath.   Cardiovascular: Negative for chest pain.  Gastrointestinal: Negative for vomiting and abdominal pain.  Musculoskeletal: Negative for back pain.       Swelling to left forearm from mid forearm to elbow with erythema and bruising.With extension of 4th and 5th finger patient experiences pain.    Skin: Negative for rash.  Neurological: Negative for syncope, numbness and headaches.  Psychiatric/Behavioral:       No  behavior change.    Allergies  Review of patient's allergies indicates no known allergies.  Home Medications   Current Outpatient Rx  Name Route Sig Dispense Refill  . CEPHALEXIN 500 MG PO CAPS Oral Take 1 capsule (500 mg total) by mouth 4 (four) times daily. 28 capsule 0    BP 131/82  Pulse 87  Temp 97.7 F (36.5 C) (Oral)  Resp 18  Ht 5\' 9"  (1.753 m)  Wt 220 lb (99.791 kg)  BMI 32.49 kg/m2  SpO2 97%  Physical Exam  ED Course  Procedures (including critical care time)  Results for orders placed during the hospital encounter of 05/14/12  CBC WITH DIFFERENTIAL      Component Value Range   WBC 10.0  4.0 - 10.5 K/uL   RBC 4.66  4.22 - 5.81 MIL/uL   Hemoglobin 14.0  13.0 - 17.0 g/dL   HCT 40.9  81.1 - 91.4 %   MCV 86.9  78.0 - 100.0 fL   MCH 30.0  26.0 - 34.0 pg   MCHC 34.6  30.0 - 36.0 g/dL   RDW 78.2  95.6 - 21.3 %   Platelets 187  150 - 400 K/uL   Neutrophils Relative 55  43 - 77 %   Neutro Abs 5.5  1.7 - 7.7 K/uL   Lymphocytes Relative 36  12 - 46 %   Lymphs Abs 3.6  0.7 - 4.0 K/uL  Monocytes Relative 7  3 - 12 %   Monocytes Absolute 0.7  0.1 - 1.0 K/uL   Eosinophils Relative 2  0 - 5 %   Eosinophils Absolute 0.2  0.0 - 0.7 K/uL   Basophils Relative 0  0 - 1 %   Basophils Absolute 0.0  0.0 - 0.1 K/uL     MDM  Patient with injury to left elbow and forearm 2 days ago now with bruising, erythema, and numbness and tingling to the 4th and 5th finger. Patient with follow up with orthopedist. He is currently on antibiotics. Given vancomycin and analgesics. Pt stable in ED with no significant deterioration in condition.The patient appears reasonably screened and/or stabilized for discharge and I doubt any other medical condition or other [facility] requiring further screening, evaluation, or treatment in the ED at this time prior to discharge.  MDM Reviewed: nursing note and vitals Interpretation: labs     .        Nicoletta Dress. Colon Branch, MD 05/15/12 [phone]

## 2012-06-03 IMAGING — CR DG OS CALCIS 2+V*L*
1 series · 1 of 1 positions shown · non-contrast
Comparison: None

CLINICAL DATA: Fall, pain.

LEFT OS CALCIS - 2+ VIEW

[view not recorded]
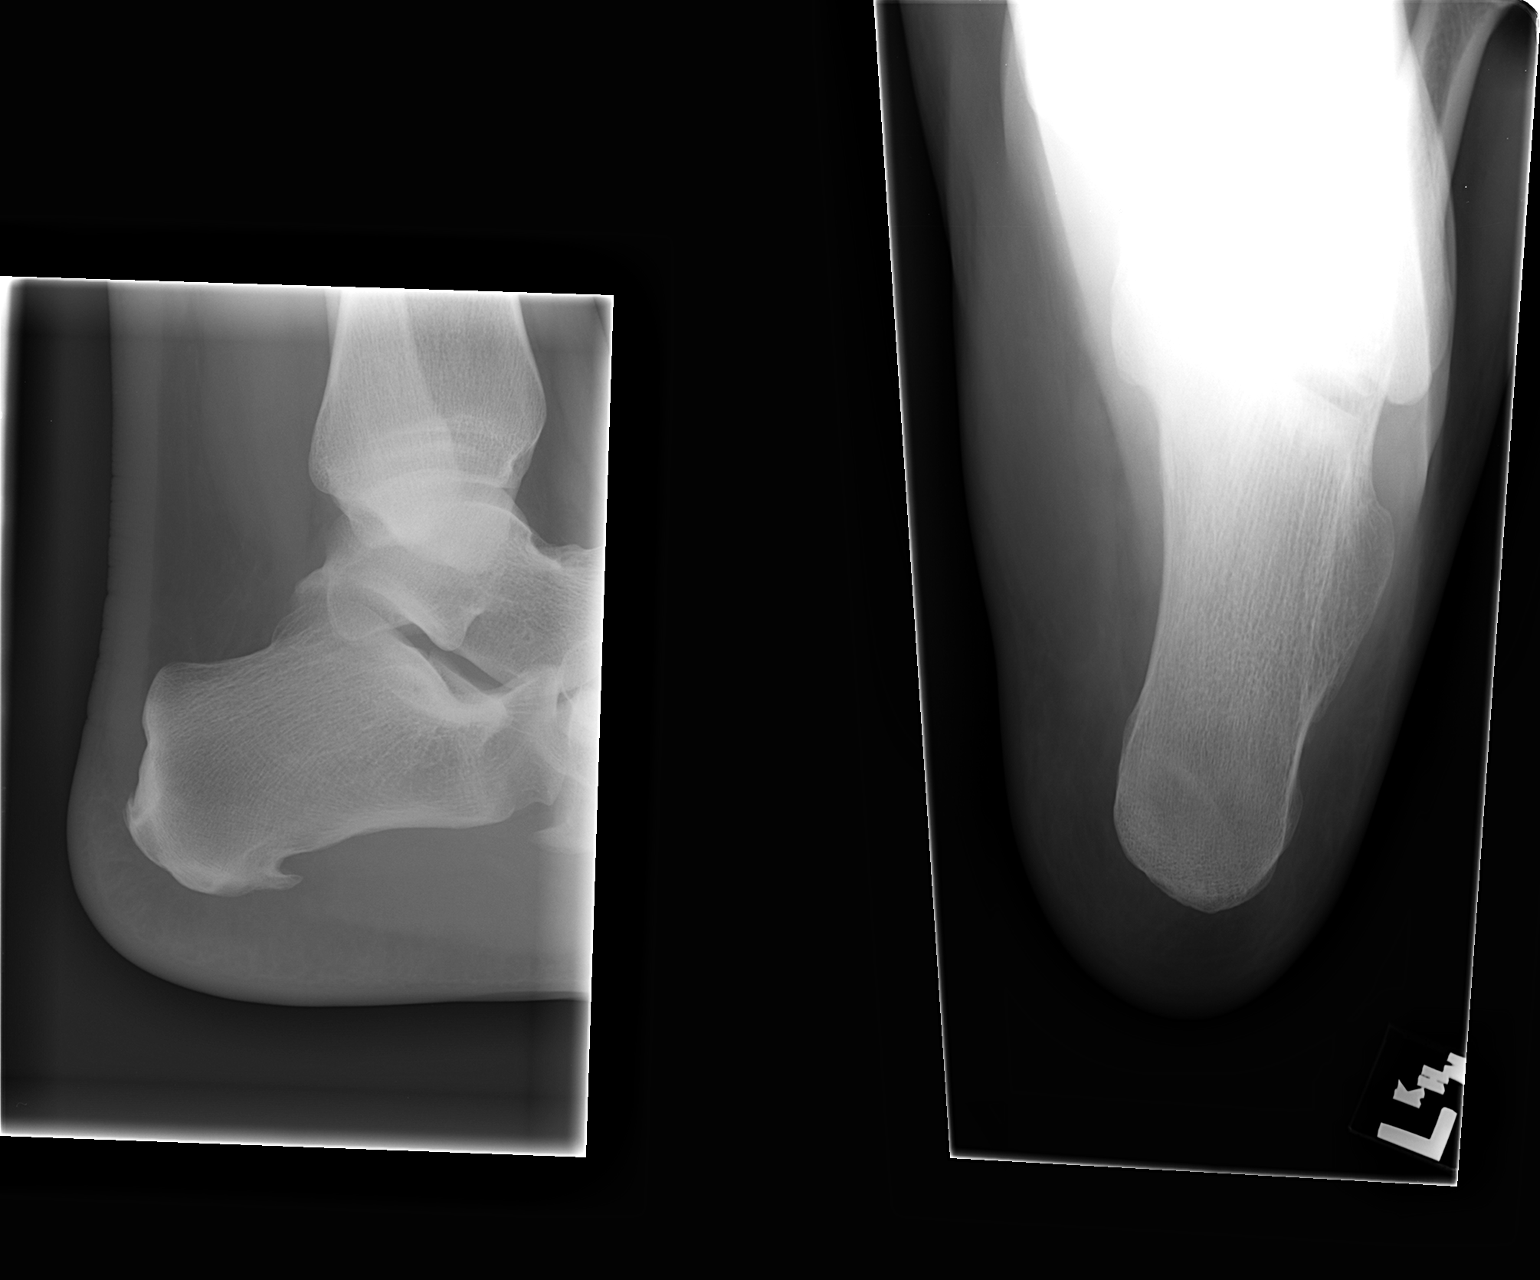

[1 of 1 positions shown; findings below may reference images not displayed]

FINDINGS: No acute bony abnormality.  Specifically, no fracture,
subluxation, or dislocation.  Soft tissues are intact.  Plantar
calcaneal spur.
IMPRESSION: No acute bony abnormality.

## 2012-08-09 IMAGING — CR DG CHEST 2V
2 series · 2 of 2 positions shown · non-contrast
Comparison: 01/19/2010

CLINICAL DATA: Fever, headache, epigastric pain

CHEST - 2 VIEW

[view not recorded (1 of 2)]
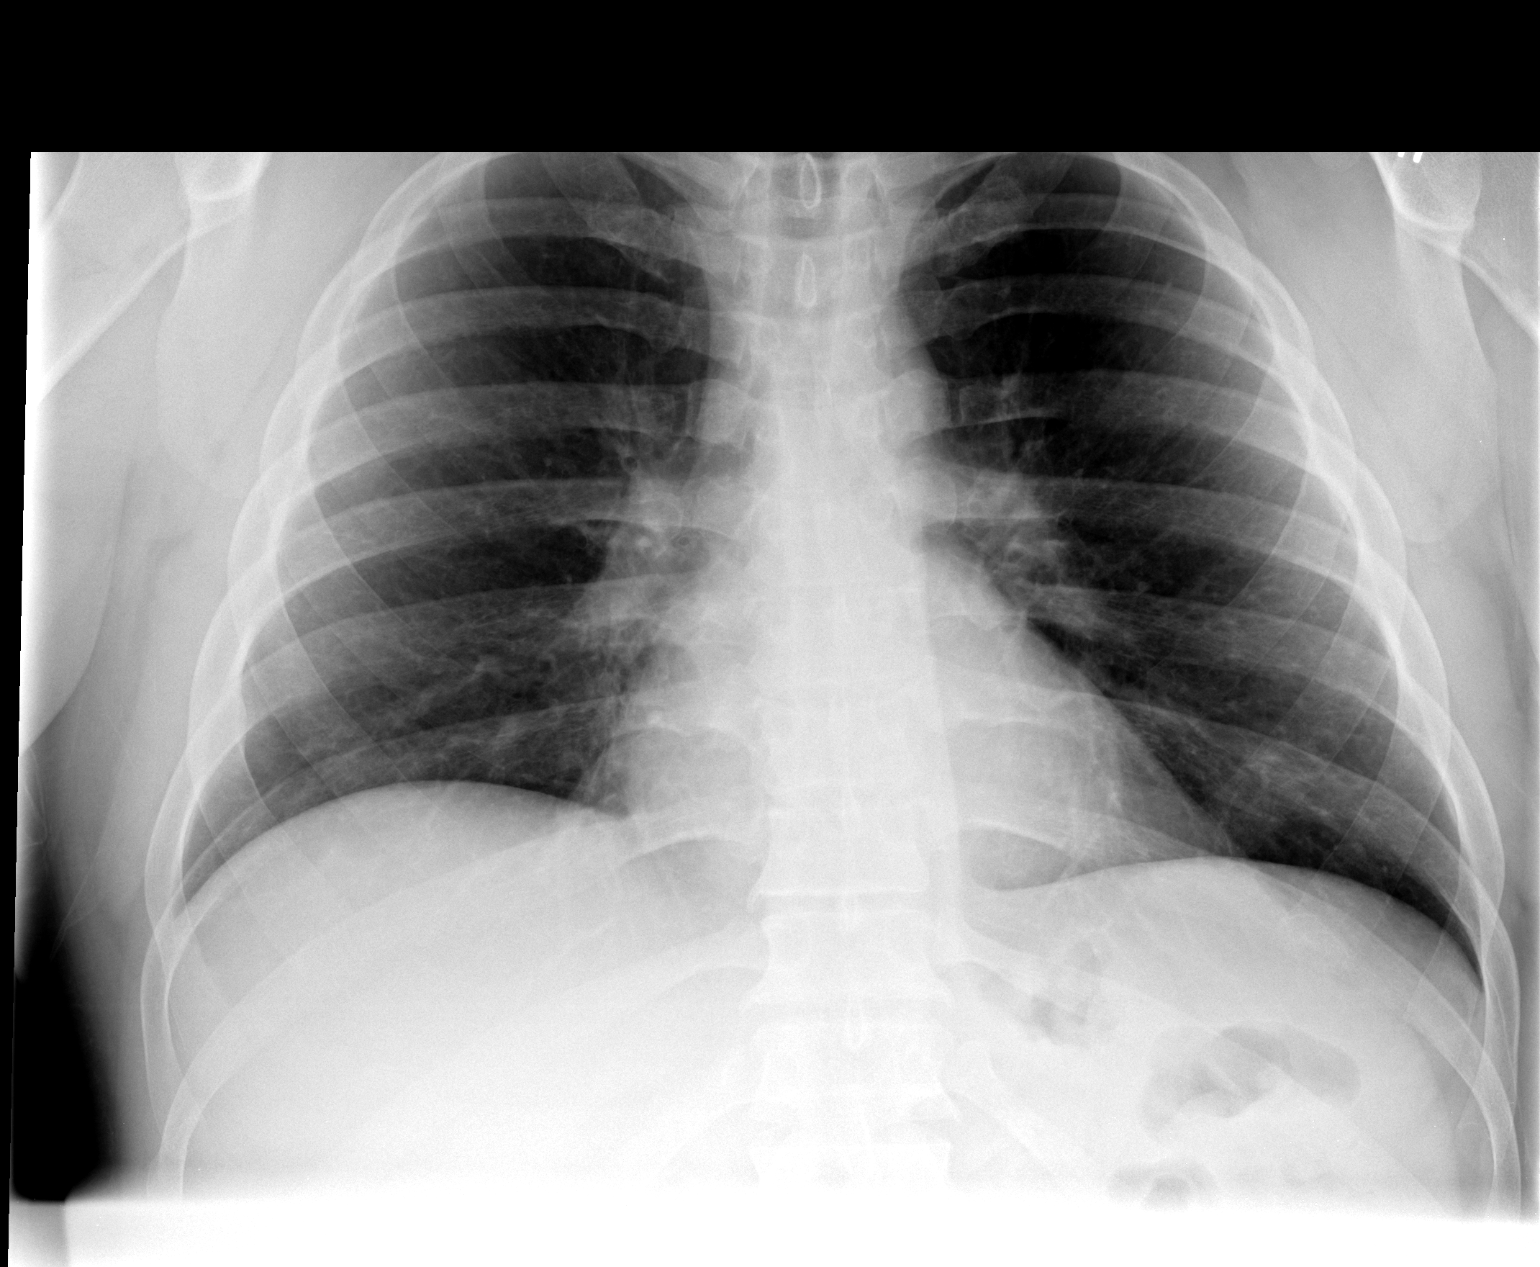

[view not recorded (2 of 2)]
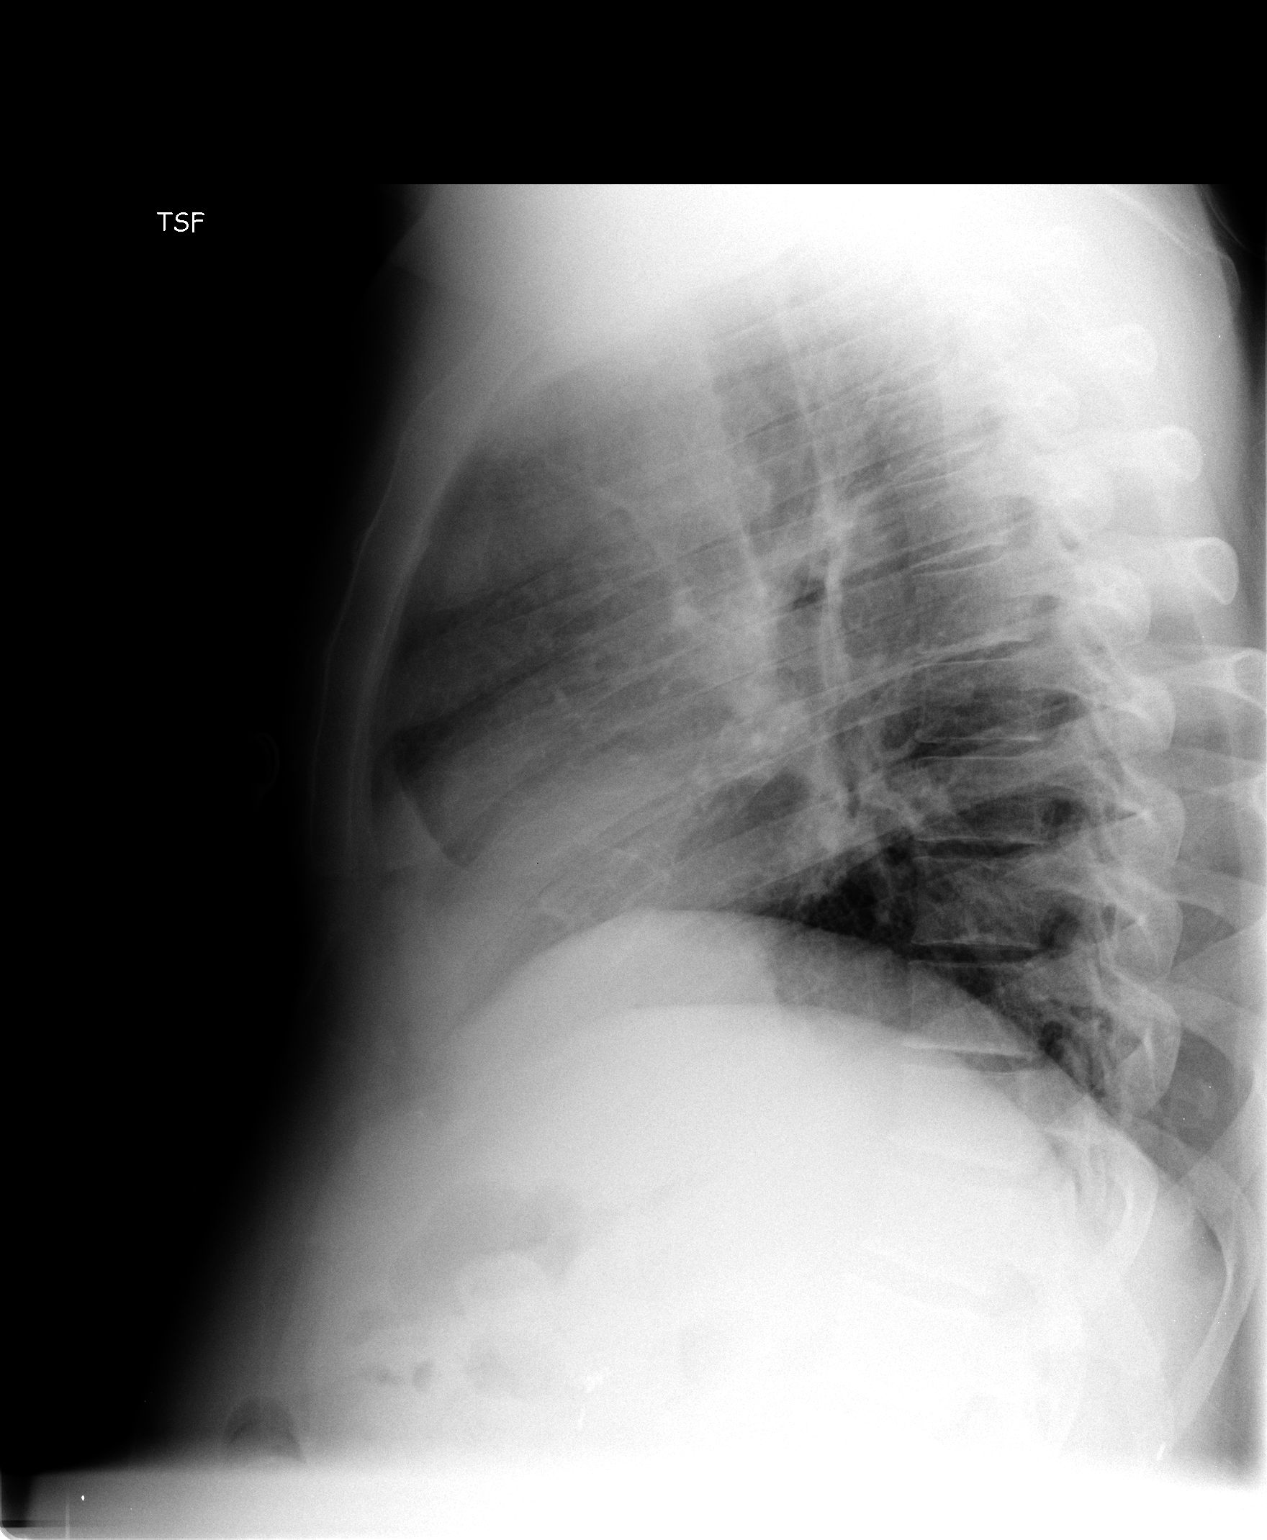

[2 of 2 positions shown; findings below may reference images not displayed]

FINDINGS: Normal heart size, mediastinal contours, and pulmonary vascularity.
Chronic peribronchial thickening without infiltrate or effusion.
No pneumothorax.
Bones unremarkable.
IMPRESSION: Mild chronic bronchitic changes.
No acute abnormalities.

## 2013-03-31 ENCOUNTER — Emergency Department (HOSPITAL_COMMUNITY)
Admission: EM | Admit: 2013-03-31 | Discharge: 2013-03-31 | Disposition: A | Discharge status: Home / Self Care | Payer: 59 | Attending: Emergency Medicine | Admitting: Emergency Medicine

## 2013-03-31 ENCOUNTER — Encounter (HOSPITAL_COMMUNITY): Payer: Self-pay | Admitting: *Deleted

## 2013-03-31 DIAGNOSIS — Z8669 Personal history of other diseases of the nervous system and sense organs: Secondary | ICD-10-CM | POA: Insufficient documentation

## 2013-03-31 DIAGNOSIS — F17200 Nicotine dependence, unspecified, uncomplicated: Secondary | ICD-10-CM | POA: Insufficient documentation

## 2013-03-31 DIAGNOSIS — Z87828 Personal history of other (healed) physical injury and trauma: Secondary | ICD-10-CM | POA: Insufficient documentation

## 2013-03-31 DIAGNOSIS — K047 Periapical abscess without sinus: Secondary | ICD-10-CM | POA: Insufficient documentation

## 2013-03-31 MED ORDER — AMOXICILLIN 500 MG PO CAPS: 500.0000 mg | ORAL_CAPSULE | Freq: Three times a day (TID) | ORAL | Status: DC

## 2013-03-31 MED ORDER — HYDROCODONE-ACETAMINOPHEN 5-325 MG PO TABS: 1.0000 | ORAL_TABLET | ORAL | Status: DC | PRN

## 2013-03-31 NOTE — ED Provider Notes (Signed)
Medical screening examination/treatment/procedure(s) were performed by non-physician practitioner and as supervising physician I was immediately available for consultation/collaboration.   Shelda Jakes, MD 03/31/13 [phone]

## 2013-03-31 NOTE — ED Notes (Addendum)
Dental pain for 2 days, rt side of jaw. Has appt with dentist on Tuesday

## 2013-03-31 NOTE — ED Notes (Signed)
Dental pain. Pt states abscess to area. Pain x 4 days

## 2013-03-31 NOTE — ED Provider Notes (Signed)
History     CSN: [id number]  Arrival date & time 03/31/13  1318   First MD Initiated Contact with Patient 03/31/13 1412      Chief Complaint  Patient presents with  . Dental Pain    (Consider location/radiation/quality/duration/timing/severity/associated sxs/prior treatment) HPI Comments: Jose Little is a 42 y.o. Male presenting with a 4  day history of dental pain and gingival swelling.   The patient has a history of injury to the tooth involved which has recently started to cause pain.  There has been no fevers,  Chills, nausea or vomiting, also no complaint of difficulty swallowing,  Although chewing makes pain worse.  The patient has tried no medications prior to arrival here.  The history is provided by the patient.    Past Medical History  Diagnosis Date  . Brain tumor     Past Surgical History  Procedure Laterality Date  . Brain surgery    . Sinus surgery with instatrak    . Cholecystectomy    . Rotator cuff repair      No family history on file.  History  Substance Use Topics  . Smoking status: Current Every Day Smoker  . Smokeless tobacco: Not on file  . Alcohol Use: No      Review of Systems  Constitutional: Negative for fever.  HENT: Positive for dental problem. Negative for sore throat, facial swelling, neck pain and neck stiffness.   Respiratory: Negative for shortness of breath.     Allergies  Review of patient's allergies indicates no known allergies.  Home Medications   Current Outpatient Rx  Name  Route  Sig  Dispense  Refill  . amoxicillin (AMOXIL) 500 MG capsule   Oral   Take 1 capsule (500 mg total) by mouth 3 (three) times daily.   30 capsule   0   . HYDROcodone-acetaminophen (NORCO/VICODIN) 5-325 MG per tablet   Oral   Take 1 tablet by mouth every 4 (four) hours as needed for pain.   20 tablet   0     BP 136/86  Pulse 79  Temp(Src) 97.5 F (36.4 C) (Oral)  Resp 16  Ht 5\' 9"  (1.753 m)  Wt 225 lb (102.059 kg)  BMI  33.21 kg/m2  SpO2 98%  Physical Exam  Constitutional: He is oriented to person, place, and time. He appears well-developed and well-nourished. No distress.  HENT:  Head: Normocephalic and atraumatic.  Right Ear: Tympanic membrane and external ear normal.  Left Ear: Tympanic membrane and external ear normal.  Mouth/Throat: Oropharynx is clear and moist and mucous membranes are normal. No oral lesions. Dental abscesses present.    Fluctuant abscess noted at right upper second molar tooth, no drainage.  Eyes: Conjunctivae are normal.  Neck: Normal range of motion. Neck supple.  Cardiovascular: Normal rate and normal heart sounds.   Pulmonary/Chest: Effort normal.  Abdominal: He exhibits no distension.  Musculoskeletal: Normal range of motion.  Lymphadenopathy:    He has no cervical adenopathy.  Neurological: He is alert and oriented to person, place, and time.  Skin: Skin is warm and dry. No erythema.  Psychiatric: He has a normal mood and affect.    ED Course  Dental Date/Time: 03/31/2013 3:08 PM Performed by: Burgess Amor Authorized by: Burgess Amor Consent: Verbal consent obtained. Risks and benefits: risks, benefits and alternatives were discussed Consent given by: patient Patient identity confirmed: verbally with patient Time out: Immediately prior to procedure a "time out" was called to verify  the correct patient, procedure, equipment, support staff and site/side marked as required. Preparation: Patient was prepped and draped in the usual sterile fashion. Comments: #11 blade was used to open up a fluctuant dental abscess along the right upper gingival line.  Patient tolerated the procedure well, moderate amount of purulence was obtained.    (including critical care time)  Labs Reviewed - No data to display No results found.   1. Dental abscess       MDM  Patient was prescribed hydrocodone and amoxicillin.  He was given dental referrals.  In the interim advised to  return here for any worsened symptoms.        Burgess Amor, PA-C 03/31/13 1510

## 2013-09-04 ENCOUNTER — Emergency Department (HOSPITAL_COMMUNITY)
Admission: EM | Admit: 2013-09-04 | Discharge: 2013-09-04 | Disposition: A | Discharge status: Home / Self Care | Payer: 59 | Attending: Emergency Medicine | Admitting: Emergency Medicine

## 2013-09-04 ENCOUNTER — Encounter (HOSPITAL_COMMUNITY): Payer: Self-pay | Admitting: Emergency Medicine

## 2013-09-04 ENCOUNTER — Emergency Department (HOSPITAL_COMMUNITY): Payer: 59

## 2013-09-04 DIAGNOSIS — F17200 Nicotine dependence, unspecified, uncomplicated: Secondary | ICD-10-CM | POA: Insufficient documentation

## 2013-09-04 DIAGNOSIS — W010XXA Fall on same level from slipping, tripping and stumbling without subsequent striking against object, initial encounter: Secondary | ICD-10-CM | POA: Insufficient documentation

## 2013-09-04 DIAGNOSIS — S7001XA Contusion of right hip, initial encounter: Secondary | ICD-10-CM

## 2013-09-04 DIAGNOSIS — Y929 Unspecified place or not applicable: Secondary | ICD-10-CM | POA: Insufficient documentation

## 2013-09-04 DIAGNOSIS — S7000XA Contusion of unspecified hip, initial encounter: Secondary | ICD-10-CM | POA: Insufficient documentation

## 2013-09-04 DIAGNOSIS — Y939 Activity, unspecified: Secondary | ICD-10-CM | POA: Insufficient documentation

## 2013-09-04 DIAGNOSIS — Z792 Long term (current) use of antibiotics: Secondary | ICD-10-CM | POA: Insufficient documentation

## 2013-09-04 DIAGNOSIS — Z8669 Personal history of other diseases of the nervous system and sense organs: Secondary | ICD-10-CM | POA: Insufficient documentation

## 2013-09-04 MED ORDER — HYDROCODONE-ACETAMINOPHEN 5-325 MG PO TABS: 1.0000 | ORAL_TABLET | ORAL | Status: DC | PRN

## 2013-09-04 MED ORDER — MELOXICAM 7.5 MG PO TABS: 7.5000 mg | ORAL_TABLET | Freq: Every day | ORAL | Status: DC

## 2013-09-04 NOTE — ED Notes (Signed)
Rt hip pain , tripped and fell .

## 2013-09-04 NOTE — ED Notes (Signed)
Patient with no complaints at this time. Respirations even and unlabored. Skin warm/dry. Discharge instructions reviewed with patient at this time. Patient given opportunity to voice concerns/ask questions. Patient discharged at this time and left Emergency Department with steady gait.   

## 2013-09-04 NOTE — ED Provider Notes (Signed)
CSN: [id number]     Arrival date & time 09/04/13  1229 History   First MD Initiated Contact with Patient 09/04/13 1244     Chief Complaint  Patient presents with  . Hip Pain   (Consider location/radiation/quality/duration/timing/severity/associated sxs/prior Treatment) Patient is a 42 y.o. male presenting with hip pain. The history is provided by the patient.  Hip Pain This is a new problem. The current episode started today. The problem occurs constantly. The problem has been unchanged. The symptoms are aggravated by standing and walking. He has tried nothing for the symptoms.  Jose Little is a 42 y.o. male who presents to the ED with right hip pain. He has had problems with his hip in the past but today he tripped and fell and landed on the right hip. He denies any other injuries, did not have LOC or hit his head.   Past Medical History  Diagnosis Date  . Brain tumor    Past Surgical History  Procedure Laterality Date  . Sinus surgery with instatrak    . Cholecystectomy    . Rotator cuff repair    . Brain surgery     History reviewed. No pertinent family history. History  Substance Use Topics  . Smoking status: Current Every Day Smoker  . Smokeless tobacco: Not on file  . Alcohol Use: No    Review of Systems Negative except as stated in HPI  Allergies  Review of patient's allergies indicates no known allergies.  Home Medications   Current Outpatient Rx  Name  Route  Sig  Dispense  Refill  . amoxicillin (AMOXIL) 500 MG capsule   Oral   Take 1 capsule (500 mg total) by mouth 3 (three) times daily.   30 capsule   0   . HYDROcodone-acetaminophen (NORCO/VICODIN) 5-325 MG per tablet   Oral   Take 1 tablet by mouth every 4 (four) hours as needed for pain.   20 tablet   0    BP 145/76  Pulse 77  Temp(Src) 98.4 F (36.9 C) (Oral)  Resp 20  Ht 5\' 9"  (1.753 m)  Wt 230 lb (104.327 kg)  BMI 33.95 kg/m2  SpO2 98% Physical Exam  Nursing note and vitals  reviewed. Constitutional: He is oriented to person, place, and time. He appears well-developed and well-nourished. No distress.  HENT:  Head: Normocephalic and atraumatic.  Eyes: Conjunctivae and EOM are normal.  Neck: Normal range of motion. Neck supple.  Cardiovascular: Normal rate.   Pulmonary/Chest: Effort normal.  Musculoskeletal:       Right hip: He exhibits tenderness. He exhibits normal range of motion, normal strength, no deformity and no laceration.       Legs: Pedal pulses equal, adequate circulation, good touch sensation. Tender with palpation of right hip. No ecchymosis noted.   Neurological: He is alert and oriented to person, place, and time. No cranial nerve deficit.  Skin: Skin is warm and dry.  Psychiatric: He has a normal mood and affect. His behavior is normal.    ED Course  Procedures   EKG Interpretation   None      Dg Hip Complete Right  09/04/2013   CLINICAL DATA:  Right hip pain after fall.  EXAM: RIGHT HIP - COMPLETE 2+ VIEW  COMPARISON:  None.  FINDINGS: There is no evidence of hip fracture or dislocation. There is no evidence of arthropathy or other focal bone abnormality.  IMPRESSION: Normal right hip.   Electronically Signed   By: Fayrene Fearing  Green M.D.   On: 09/04/2013 13:56    MDM  42 y.o. male with contusion to the right hip s/p fall. Will treat pain and inflammation. He will follow up with ortho as needed. Stable for discharge without any immediate complications. He remains neurovascularly intact.  I have reviewed this patient's vital signs, nurses notes, appropriate imaging and discussed findings and plan of care with the patient. He voices understanding.      Medication List         HYDROcodone-acetaminophen 5-325 MG per tablet  Commonly known as:  NORCO/VICODIN  Take 1 tablet by mouth every 4 (four) hours as needed.     meloxicam 7.5 MG tablet  Commonly known as:  MOBIC  Take 1 tablet (7.5 mg total) by mouth daily.           [facility] Orlene Och, NP 09/04/13 1723

## 2013-09-11 NOTE — ED Provider Notes (Signed)
Medical screening examination/treatment/procedure(s) were performed by non-physician practitioner and as supervising physician I was immediately available for consultation/collaboration.  EKG Interpretation   None         Raymond Bhardwaj L Trusten Hume, MD 09/11/13 0702 

## 2014-07-16 ENCOUNTER — Emergency Department (HOSPITAL_COMMUNITY)
Admission: EM | Admit: 2014-07-16 | Discharge: 2014-07-17 | Disposition: A | Discharge status: Home / Self Care | Payer: 59 | Attending: Emergency Medicine | Admitting: Emergency Medicine

## 2014-07-16 ENCOUNTER — Encounter (HOSPITAL_COMMUNITY): Payer: Self-pay | Admitting: Emergency Medicine

## 2014-07-16 DIAGNOSIS — Y9389 Activity, other specified: Secondary | ICD-10-CM | POA: Insufficient documentation

## 2014-07-16 DIAGNOSIS — Z86011 Personal history of benign neoplasm of the brain: Secondary | ICD-10-CM | POA: Insufficient documentation

## 2014-07-16 DIAGNOSIS — Z791 Long term (current) use of non-steroidal anti-inflammatories (NSAID): Secondary | ICD-10-CM | POA: Insufficient documentation

## 2014-07-16 DIAGNOSIS — Z87891 Personal history of nicotine dependence: Secondary | ICD-10-CM | POA: Insufficient documentation

## 2014-07-16 DIAGNOSIS — Z23 Encounter for immunization: Secondary | ICD-10-CM | POA: Insufficient documentation

## 2014-07-16 DIAGNOSIS — Y9289 Other specified places as the place of occurrence of the external cause: Secondary | ICD-10-CM | POA: Insufficient documentation

## 2014-07-16 DIAGNOSIS — W270XXA Contact with workbench tool, initial encounter: Secondary | ICD-10-CM | POA: Insufficient documentation

## 2014-07-16 DIAGNOSIS — S61012A Laceration without foreign body of left thumb without damage to nail, initial encounter: Secondary | ICD-10-CM | POA: Insufficient documentation

## 2014-07-16 DIAGNOSIS — Y990 Civilian activity done for income or pay: Secondary | ICD-10-CM | POA: Insufficient documentation

## 2014-07-16 MED ORDER — TETANUS-DIPHTH-ACELL PERTUSSIS 5-2.5-18.5 LF-MCG/0.5 IM SUSP
0.5000 mL | Freq: Once | INTRAMUSCULAR | Status: AC
  Administered 2014-07-17: 0.5 mL via INTRAMUSCULAR
  Filled 2014-07-16: qty 0.5

## 2014-07-16 MED ORDER — CEPHALEXIN 500 MG PO CAPS
500.0000 mg | ORAL_CAPSULE | Freq: Once | ORAL | Status: AC
  Administered 2014-07-17: 500 mg via ORAL
  Filled 2014-07-16: qty 1

## 2014-07-16 MED ORDER — CEPHALEXIN 500 MG PO CAPS: 1000.0000 mg | ORAL_CAPSULE | Freq: Two times a day (BID) | ORAL | Status: DC

## 2014-07-16 MED ORDER — POVIDONE-IODINE 10 % EX SOLN
CUTANEOUS | Status: AC
  Filled 2014-07-16: qty 118

## 2014-07-16 MED ORDER — LIDOCAINE HCL (PF) 2 % IJ SOLN
INTRAMUSCULAR | Status: AC
  Filled 2014-07-16: qty 10

## 2014-07-16 NOTE — Discharge Instructions (Signed)

## 2014-07-16 NOTE — ED Notes (Signed)
Lac to lt thumb,

## 2014-07-17 MED ORDER — BACITRACIN ZINC 500 UNIT/GM EX OINT
TOPICAL_OINTMENT | CUTANEOUS | Status: AC
  Filled 2014-07-17: qty 0.9

## 2014-07-17 NOTE — ED Provider Notes (Signed)
CSN: [id number]     Arrival date & time 07/16/14  1953 History   First MD Initiated Contact with Patient 07/16/14 2305     Chief Complaint  Patient presents with  . Laceration     (Consider location/radiation/quality/duration/timing/severity/associated sxs/prior Treatment) The history is provided by the patient.   Jose Little is a 43 y.o. male presenting with laceration to his left thumb.  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  It bled copiously but improved with direct pressure.  He denies numbness distal to the injury site.  He does not know his tetanus status.     Past Medical History  Diagnosis Date  . Brain tumor    Past Surgical History  Procedure Laterality Date  . Sinus surgery with instatrak    . Cholecystectomy    . Rotator cuff repair    . Brain surgery     History reviewed. No pertinent family history. History  Substance Use Topics  . Smoking status: Former Research scientist (life sciences)  . Smokeless tobacco: Not on file  . Alcohol Use: No    Review of Systems  Constitutional: Negative for fever and chills.  Respiratory: Negative for shortness of breath and wheezing.   Skin: Positive for wound.  Neurological: Negative for numbness.      Allergies  Review of patient's allergies indicates no known allergies.  Home Medications   Prior to Admission medications   Medication Sig Start Date End Date Taking? Authorizing Provider  cephALEXin (KEFLEX) 500 MG capsule Take 2 capsules (1,000 mg total) by mouth 2 (two) times daily. 07/16/14   Evalee Jefferson, PA-C  HYDROcodone-acetaminophen (NORCO/VICODIN) 5-325 MG per tablet Take 1 tablet by mouth every 4 (four) hours as needed. 09/04/13   Hope Bunnie Pion, NP  meloxicam (MOBIC) 7.5 MG tablet Take 1 tablet (7.5 mg total) by mouth daily. 09/04/13   Hope Bunnie Pion, NP   BP 144/86  Pulse 84  Temp(Src) 98.1 F (36.7 C) (Oral)   Resp 20  Ht 5\' 9"  (1.753 m)  Wt 220 lb (99.791 kg)  BMI 32.47 kg/m2  SpO2 96% Physical Exam  Constitutional: He is oriented to person, place, and time. He appears well-developed and well-nourished.  HENT:  Head: Normocephalic.  Cardiovascular: Normal rate.   Pulmonary/Chest: Effort normal.  Musculoskeletal: He exhibits tenderness.  1.5 cm subcutaneous laceration which is hemostatic located on his distal thumb on the radial lateral distal phalanx.  Neurological: He is alert and oriented to person, place, and time. No sensory deficit.  Skin: Laceration noted.    ED Course  Procedures (including critical care time)  LACERATION REPAIR Performed by: Evalee Jefferson Authorized by: Evalee Jefferson Consent: Verbal consent obtained. Risks and benefits: risks, benefits and alternatives were discussed Consent given by: patient Patient identity confirmed: provided demographic data Prepped and Draped in normal sterile fashion Wound explored  Laceration Location: left thumb  Laceration Length: 1.5 cm  No Foreign Bodies seen or palpated  Anesthesia: Digital block Local anesthetic: lidocaine 2 % without epinephrine  Anesthetic total: 1 ml  Irrigation method: syringe Amount of cleaning: standard  Skin closure: Ethilon 4-0  Number of sutures: 4   Technique: Simple interrupted   Patient tolerance: Patient tolerated the procedure well with no immediate complications.  Labs Review Labs Reviewed - No data to display  Imaging Review No results found.   EKG Interpretation None  MDM   Final diagnoses:  Laceration of left thumb, initial encounter    Wound care instructions given.  Pt advised to have sutures removed in 10 days,  Return here sooner for any signs of infection including redness, swelling, worse pain or drainage of pus. Patient's tetanus was updated today.  Given the dirty wound he was also placed on Keflex for prophylactic treatment.       Evalee Jefferson,  PA-C 07/17/14 1427

## 2014-07-18 NOTE — ED Provider Notes (Signed)
Medical screening examination/treatment/procedure(s) were performed by non-physician practitioner and as supervising physician I was immediately available for consultation/collaboration.   EKG Interpretation None        Hoy Morn, MD 07/18/14 0104

## 2014-09-21 ENCOUNTER — Encounter (HOSPITAL_COMMUNITY): Payer: Self-pay | Admitting: Emergency Medicine

## 2014-09-21 ENCOUNTER — Emergency Department (HOSPITAL_COMMUNITY)
Admission: EM | Admit: 2014-09-21 | Discharge: 2014-09-21 | Disposition: A | Discharge status: Home / Self Care | Payer: Self-pay | Attending: Emergency Medicine | Admitting: Emergency Medicine

## 2014-09-21 ENCOUNTER — Emergency Department (HOSPITAL_COMMUNITY): Payer: Self-pay

## 2014-09-21 DIAGNOSIS — Z86012 Personal history of benign carcinoid tumor: Secondary | ICD-10-CM | POA: Insufficient documentation

## 2014-09-21 DIAGNOSIS — Z791 Long term (current) use of non-steroidal anti-inflammatories (NSAID): Secondary | ICD-10-CM | POA: Insufficient documentation

## 2014-09-21 DIAGNOSIS — R52 Pain, unspecified: Secondary | ICD-10-CM

## 2014-09-21 DIAGNOSIS — Z87891 Personal history of nicotine dependence: Secondary | ICD-10-CM | POA: Insufficient documentation

## 2014-09-21 DIAGNOSIS — M7712 Lateral epicondylitis, left elbow: Secondary | ICD-10-CM | POA: Insufficient documentation

## 2014-09-21 DIAGNOSIS — Z792 Long term (current) use of antibiotics: Secondary | ICD-10-CM | POA: Insufficient documentation

## 2014-09-21 MED ORDER — IBUPROFEN 600 MG PO TABS: 600.0000 mg | ORAL_TABLET | Freq: Four times a day (QID) | ORAL | Status: DC | PRN

## 2014-09-21 MED ORDER — IBUPROFEN 400 MG PO TABS
600.0000 mg | ORAL_TABLET | Freq: Once | ORAL | Status: AC
  Administered 2014-09-21: 600 mg via ORAL
  Filled 2014-09-21: qty 2

## 2014-09-21 NOTE — Discharge Instructions (Signed)

## 2014-09-21 NOTE — ED Provider Notes (Signed)
CSN: [id number]     Arrival date & time 09/21/14  7619 History  This chart was scribed for Jose Manifold, MD by Dellis Filbert, ED Scribe. The patient was seen in [place] and the patient's care was started at 7:52 AM.  Chief Complaint  Patient presents with  . Arm Pain   The history is provided by the patient. No language interpreter was used.    HPI Comments: Jose Little is a 43 y.o. male who presents to the Emergency Department complaining of left arm pain. Pt states fell 2 months ago while working directly on his left arm and the pain is gradually wosrsening. He says he is unable to use his left arm so he decided to come into the ED. Pt feels like his muscle drags and he feels a pop which is very painful. He has not taken anything for relief. Pt is ambidextrous  He is an Clinical biochemist.  Pt does smoke and his father has lung cancer. He has seen a pulmonologist. Past Medical History  Diagnosis Date  . Brain tumor    Past Surgical History  Procedure Laterality Date  . Sinus surgery with instatrak    . Cholecystectomy    . Rotator cuff repair    . Brain surgery     History reviewed. No pertinent family history. History  Substance Use Topics  . Smoking status: Former Research scientist (life sciences)  . Smokeless tobacco: Not on file  . Alcohol Use: No    Review of Systems  Musculoskeletal: Positive for myalgias and arthralgias.  All other systems reviewed and are negative.  Allergies  Review of patient's allergies indicates no known allergies.  Home Medications   Prior to Admission medications   Medication Sig Start Date End Date Taking? Authorizing Provider  cephALEXin (KEFLEX) 500 MG capsule Take 2 capsules (1,000 mg total) by mouth 2 (two) times daily. 07/16/14   Evalee Jefferson, PA-C  HYDROcodone-acetaminophen (NORCO/VICODIN) 5-325 MG per tablet Take 1 tablet by mouth every 4 (four) hours as needed. 09/04/13   Hope Bunnie Pion, NP  meloxicam (MOBIC) 7.5 MG tablet Take 1 tablet (7.5 mg total) by  mouth daily. 09/04/13   Hope Bunnie Pion, NP   BP 137/83 mmHg  Pulse 87  Temp(Src) 97.6 F (36.4 C) (Oral)  Resp 16  Ht 5\' 9"  (1.753 m)  Wt 210 lb (95.255 kg)  BMI 31.00 kg/m2  SpO2 97% Physical Exam  Constitutional: He appears well-developed and well-nourished. No distress.  HENT:  Head: Normocephalic and atraumatic.  Eyes: Conjunctivae are normal. Right eye exhibits no discharge. Left eye exhibits no discharge.  Neck: Neck supple.  Cardiovascular: Normal rate, regular rhythm and normal heart sounds.  Exam reveals no gallop and no friction rub.   No murmur heard. Pulmonary/Chest: Effort normal and breath sounds normal. No respiratory distress.  Abdominal: Soft. He exhibits no distension. There is no tenderness.  Musculoskeletal: He exhibits no edema or tenderness.  Tenderness in the area at the lateral epicondyle left humerus. No concerning skin changes noted. Patient is able to actively range his elbow. Reports some mild increase in pain with this. Pain is generally worse with pronation/fashion. Increased pain with passive wrist extension. Wrist flexion doesn't seem to bother him so much. Neurovascular intact distally.   Neurological: He is alert.  NVI  Skin: Skin is warm and dry.  Psychiatric: He has a normal mood and affect. His behavior is normal. Thought content normal.  Nursing note and vitals reviewed.   ED Course  Procedures  DIAGNOSTIC STUDIES: Oxygen Saturation is 97% on room air, normal by my interpretation.    COORDINATION OF CARE: 8:00 AM Discussed treatment plan with pt at bedside and pt agreed to plan.  Labs Review Labs Reviewed - No data to display  Imaging Review No results found.   EKG Interpretation None      MDM   Final diagnoses:  Epicondylitis, lateral, left   43 year old male with left elbow pain. She relates this to a fall a couple months ago. Imaging is negative for acute osseous abnormality. Suspect this is actually lateral epicondylitis.  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 Typical course discussed with him. Return precautions were discussed as well. Symptomatic treatment. Sports medicine/orthopedic follow-up for persistent symptoms.    Jose Manifold, MD 09/23/14 1057
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
## 2014-10-17 ENCOUNTER — Encounter (HOSPITAL_COMMUNITY): Payer: Self-pay | Admitting: *Deleted

## 2014-10-17 ENCOUNTER — Emergency Department (HOSPITAL_COMMUNITY)
Admission: EM | Admit: 2014-10-17 | Discharge: 2014-10-17 | Disposition: A | Discharge status: Home / Self Care | Payer: Self-pay | Attending: Emergency Medicine | Admitting: Emergency Medicine

## 2014-10-17 DIAGNOSIS — Z791 Long term (current) use of non-steroidal anti-inflammatories (NSAID): Secondary | ICD-10-CM | POA: Insufficient documentation

## 2014-10-17 DIAGNOSIS — Z85841 Personal history of malignant neoplasm of brain: Secondary | ICD-10-CM | POA: Insufficient documentation

## 2014-10-17 DIAGNOSIS — Y9389 Activity, other specified: Secondary | ICD-10-CM | POA: Insufficient documentation

## 2014-10-17 DIAGNOSIS — Z792 Long term (current) use of antibiotics: Secondary | ICD-10-CM | POA: Insufficient documentation

## 2014-10-17 DIAGNOSIS — Y990 Civilian activity done for income or pay: Secondary | ICD-10-CM | POA: Insufficient documentation

## 2014-10-17 DIAGNOSIS — T300 Burn of unspecified body region, unspecified degree: Secondary | ICD-10-CM

## 2014-10-17 DIAGNOSIS — Z87891 Personal history of nicotine dependence: Secondary | ICD-10-CM | POA: Insufficient documentation

## 2014-10-17 DIAGNOSIS — X088XXA Exposure to other specified smoke, fire and flames, initial encounter: Secondary | ICD-10-CM | POA: Insufficient documentation

## 2014-10-17 DIAGNOSIS — T23231A Burn of second degree of multiple right fingers (nail), not including thumb, initial encounter: Secondary | ICD-10-CM | POA: Insufficient documentation

## 2014-10-17 DIAGNOSIS — Y9289 Other specified places as the place of occurrence of the external cause: Secondary | ICD-10-CM | POA: Insufficient documentation

## 2014-10-17 MED ORDER — HYDROCODONE-ACETAMINOPHEN 5-325 MG PO TABS: 1.0000 | ORAL_TABLET | ORAL | Status: DC | PRN

## 2014-10-17 MED ORDER — SILVER SULFADIAZINE 1 % EX CREA
TOPICAL_CREAM | Freq: Once | CUTANEOUS | Status: AC
  Administered 2014-10-17: 21:00:00 via TOPICAL
  Filled 2014-10-17: qty 50
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
## 2014-11-22 ENCOUNTER — Encounter (HOSPITAL_COMMUNITY): Payer: Self-pay | Admitting: Emergency Medicine

## 2014-11-22 ENCOUNTER — Emergency Department (HOSPITAL_COMMUNITY): Payer: Self-pay

## 2014-11-22 ENCOUNTER — Emergency Department (HOSPITAL_COMMUNITY)
Admission: EM | Admit: 2014-11-22 | Discharge: 2014-11-22 | Disposition: A | Discharge status: Home / Self Care | Payer: Self-pay | Attending: Emergency Medicine | Admitting: Emergency Medicine

## 2014-11-22 DIAGNOSIS — Z87891 Personal history of nicotine dependence: Secondary | ICD-10-CM | POA: Insufficient documentation

## 2014-11-22 DIAGNOSIS — H7191 Unspecified cholesteatoma, right ear: Secondary | ICD-10-CM | POA: Insufficient documentation

## 2014-11-22 DIAGNOSIS — Z792 Long term (current) use of antibiotics: Secondary | ICD-10-CM | POA: Insufficient documentation

## 2014-11-22 DIAGNOSIS — Z86011 Personal history of benign neoplasm of the brain: Secondary | ICD-10-CM | POA: Insufficient documentation

## 2014-11-22 DIAGNOSIS — R42 Dizziness and giddiness: Secondary | ICD-10-CM | POA: Insufficient documentation

## 2014-11-22 DIAGNOSIS — H9201 Otalgia, right ear: Secondary | ICD-10-CM | POA: Insufficient documentation

## 2014-11-22 DIAGNOSIS — Z791 Long term (current) use of non-steroidal anti-inflammatories (NSAID): Secondary | ICD-10-CM | POA: Insufficient documentation

## 2014-11-22 MED ORDER — MECLIZINE HCL 12.5 MG PO TABS
25.0000 mg | ORAL_TABLET | Freq: Once | ORAL | Status: AC
  Administered 2014-11-22: 25 mg via ORAL
  Filled 2014-11-22: qty 2

## 2014-11-22 MED ORDER — CIPROFLOXACIN-HYDROCORTISONE 0.2-1 % OT SUSP: 3.0000 [drp] | Freq: Two times a day (BID) | OTIC | Status: AC

## 2014-11-22 MED ORDER — ANTIPYRINE-BENZOCAINE 5.4-1.4 % OT SOLN: 3.0000 [drp] | OTIC | Status: DC | PRN

## 2014-11-22 NOTE — ED Provider Notes (Signed)
CSN: [id number]     Arrival date & time 11/22/14  4967 History  This chart was scribed for Ezequiel Essex, MD by Stephania Fragmin, ED Scribe. This patient was seen in room APA12/APA12 and the patient's care was started at 7:27 AM.    Chief Complaint  Patient presents with  . Otalgia   The history is provided by the patient. No language interpreter was used.     HPI Comments: Jose Little is a 44 y.o. male who presents to the Emergency Department complaining of sudden onset right otalgia that began last night at 10 PM, about 10 hours ago, when patient was in bed asleep. He complains of associated room-spinning dizziness. Patient tried ear drops with no relief. Nothing makes it better or worse. He states he had a similar pain 5 years ago, which was diagnosed as a "brain tumor" with a CT scan, and patient was sent to [facility] for gamma knife surgery that went through his left ear to remove the tumor. He states he hasn't had a follow-up since his surgery, and hasn't had any other ear problems for the past 5 years until now. He denies a history of hypertension or DM. He denies fever, vomiting, hearing changes, visual changes, ear bleeding or drainage, or any pain in his left ear.  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 No tinnitus.   Past Medical History  Diagnosis Date  . Brain tumor    Past Surgical History  Procedure Laterality Date  . Sinus surgery with instatrak    . Cholecystectomy    . Rotator cuff repair    . Brain surgery     History reviewed. No pertinent family history. History  Substance Use Topics  . Smoking status: Former Research scientist (life sciences)  . Smokeless tobacco: Not on file  . Alcohol Use: No    Review of Systems  Constitutional: Negative for fever.  HENT: Positive for ear pain. Negative for ear discharge.   Eyes: Negative for visual disturbance.  Neurological: Positive for dizziness.      Allergies  Review of patient's allergies indicates no known  allergies.  Home Medications   Prior to Admission medications   Medication Sig Start Date End Date Taking? Authorizing Provider  antipyrine-benzocaine Toniann Fail) otic solution Place 3-4 drops into the right ear every 2 (two) hours as needed for ear pain. 11/22/14   Ezequiel Essex, MD  cephALEXin (KEFLEX) 500 MG capsule Take 2 capsules (1,000 mg total) by mouth 2 (two) times daily. 07/16/14   Evalee Jefferson, PA-C  ciprofloxacin-hydrocortisone (CIPRO HC) otic suspension Place 3 drops into the right ear 2 (two) times daily. 11/22/14 11/29/14  Ezequiel Essex, MD  HYDROcodone-acetaminophen (NORCO/VICODIN) 5-325 MG per tablet Take 1 tablet by mouth every 4 (four) hours as needed. 10/17/14   Evalee Jefferson, PA-C  ibuprofen (ADVIL,MOTRIN) 600 MG tablet Take 1 tablet (600 mg total) by mouth every 6 (six) hours as needed. 09/21/14   [name], MD  meloxicam (MOBIC) 7.5 MG tablet Take 1 tablet (7.5 mg total) by mouth daily. 09/04/13   Hope Bunnie Pion, NP   BP 130/87 mmHg  Pulse 74  Temp(Src) 98.3 F (36.8 C) (Oral)  Resp 16  Ht 5\' 9"  (1.753 m)  Wt 220 lb (99.791 kg)  BMI 32.47 kg/m2  SpO2 93% Physical Exam  Constitutional: He is oriented to person, place, and time. He appears well-developed and well-nourished. No distress.  HENT:  Head: Normocephalic and atraumatic.  Right Ear: External ear and  ear canal normal.  Left Ear: External ear and ear canal normal.  Mouth/Throat: Oropharynx is clear and moist. No oropharyngeal exudate.  Mild tragus tenderness. TM is opaque with intact cone of light. Possible superior cholesteatoma No mastoid tenderness. No erythema or bulging to the TM.  Eyes: Conjunctivae and EOM are normal. Pupils are equal, round, and reactive to light.  Neck: Normal range of motion. Neck supple.  No meningismus.  Cardiovascular: Normal rate, regular rhythm, normal heart sounds and intact distal pulses.   No murmur heard. Pulmonary/Chest: Effort normal and breath sounds normal. No  respiratory distress.  Abdominal: Soft. There is no tenderness. There is no rebound and no guarding.  Musculoskeletal: Normal range of motion. He exhibits no edema or tenderness.  Neurological: He is alert and oriented to person, place, and time. No cranial nerve deficit. He exhibits normal muscle tone. Coordination normal.  No ataxia on finger to nose bilaterally. No pronator drift. 5/5 strength throughout. CN 2-12 intact. Negative Romberg. Equal grip strength. Sensation intact. Gait is normal.   Skin: Skin is warm.  Psychiatric: He has a normal mood and affect. His behavior is normal.  Nursing note and vitals reviewed.   ED Course  Procedures (including critical care time)  DIAGNOSTIC STUDIES: Oxygen Saturation is 95% on room air, adequate by my interpretation.    COORDINATION OF CARE: 7:29 AM - Discussed treatment plan with pt at bedside which includes medication to treat dizziness, and pt agreed to plan.   Labs Review Labs Reviewed - No data to display  Imaging Review Ct Head Wo Contrast  11/22/2014   CLINICAL DATA:  Acute onset dizziness  EXAM: CT HEAD WITHOUT CONTRAST  TECHNIQUE: Contiguous axial images were obtained from the base of the skull through the vertex without intravenous contrast.  COMPARISON:  March 14, 2010  FINDINGS: The ventricles are normal in size and configuration. There is no mass, hemorrhage, extra-axial fluid collection, or midline shift. Gray-white compartments are normal. No acute infarct apparent. Bony calvarium appears intact. Mastoids are somewhat hypoplastic bilaterally. There is opacification of the mastoids on the left, a finding not present previously. The mastoids on the right are clear.  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 No intracranial mass, hemorrhage, or focal gray -white compartment lesions/acute appearing infarct.   Electronically Signed   By: Lowella Grip III M.D.   On: 11/22/2014 07:54      EKG Interpretation None      MDM   Final diagnoses:  Otalgia of right ear  Cholesteatoma, right  Episode of right ear pain onset 10 PM last night. Associated lightheadedness. Patient with history of "brain tumor" status post gamma knife surgery at [facility]. He is worried about recurrence. 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  Case discussed with Dr. Danne Baxter, on call for Dr. Lu Duffel of [facility]. He feels mastoid opacification is likely incidental finding and not clinically relevant as patient has no mastoid tenderness or clinical evidence of mastoiditis.  Treat for otitis externa, possible cholesteatoma.  Follow up with ENT this week. Return precautions discussed.   I personally performed the services described in this documentation, which was scribed in my presence. The recorded information has been reviewed and is accurate.   Ezequiel Essex, MD 11/22/14 [phone]

## 2014-11-22 NOTE — ED Notes (Signed)
Pt states that his right ear started hurting with dizziness about 2am today.  States he had a brain tumor years ago and is concerned it has returned.

## 2014-11-22 NOTE — Discharge Instructions (Signed)
Otalgia Used antibiotics as prescribed and follow up with your ear doctor. You may have a collection of skin cells in your ear called a cholesteatoma. Return to the ED if you develop new or worsening symptoms. The most common reason for this in children is an infection of the middle ear. Pain from the middle ear is usually caused by a build-up of fluid and pressure behind the eardrum. Pain from an earache can be sharp, dull, or burning. The pain may be temporary or constant. The middle ear is connected to the nasal passages by a short narrow tube called the Eustachian tube. The Eustachian tube allows fluid to drain out of the middle ear, and helps keep the pressure in your ear equalized. CAUSES  A cold or allergy can block the Eustachian tube with inflammation and the build-up of secretions. This is especially likely in small children, because their Eustachian tube is shorter and more horizontal. When the Eustachian tube closes, the normal flow of fluid from the middle ear is stopped. Fluid can accumulate and cause stuffiness, pain, hearing loss, and an ear infection if germs start growing in this area. SYMPTOMS  The symptoms of an ear infection may include fever, ear pain, fussiness, increased crying, and irritability. Many children will have temporary and minor hearing loss during and right after an ear infection. Permanent hearing loss is rare, but the risk increases the more infections a child has. Other causes of ear pain include retained water in the outer ear canal from swimming and bathing. Ear pain in adults is less likely to be from an ear infection. Ear pain may be referred from other locations. Referred pain may be from the joint between your jaw and the skull. It may also come from a tooth problem or problems in the neck. Other causes of ear pain include:  A foreign body in the ear.  Outer ear infection.  Sinus infections.  Impacted ear wax.  Ear injury.  Arthritis of the jaw or TMJ  problems.  Middle ear infection.  Tooth infections.  Sore throat with pain to the ears. DIAGNOSIS  Your caregiver can usually make the diagnosis by examining you. Sometimes other special studies, including x-rays and lab work may be necessary. TREATMENT   If antibiotics were prescribed, use them as directed and finish them even if you or your child's symptoms seem to be improved.  Sometimes PE tubes are needed in children. These are little plastic tubes which are put into the eardrum during a simple surgical procedure. They allow fluid to drain easier and allow the pressure in the middle ear to equalize. This helps relieve the ear pain caused by pressure changes. HOME CARE INSTRUCTIONS   Only take over-the-counter or prescription medicines for pain, discomfort, or fever as directed by your caregiver. DO NOT GIVE CHILDREN ASPIRIN because of the association of Reye's Syndrome in children taking aspirin.  Use a cold pack applied to the outer ear for 15-20 minutes, 03-04 times per day or as needed may reduce pain. Do not apply ice directly to the skin. You may cause frost bite.  Over-the-counter ear drops used as directed may be effective. Your caregiver may sometimes prescribe ear drops.  Resting in an upright position may help reduce pressure in the middle ear and relieve pain.  Ear pain caused by rapidly descending from high altitudes can be relieved by swallowing or chewing gum. Allowing infants to suck on a bottle during airplane travel can help.  Do not smoke  in the house or near children. If you are unable to quit smoking, smoke outside.  Control allergies. SEEK IMMEDIATE MEDICAL CARE IF:   You or your child are becoming sicker.  Pain or fever relief is not obtained with medicine.  You or your child's symptoms (pain, fever, or irritability) do not improve within 24 to 48 hours or as instructed.  Severe pain suddenly stops hurting. This may indicate a ruptured eardrum.  You  or your children develop new problems such as severe headaches, stiff neck, difficulty swallowing, or swelling of the face or around the ear. Document Released: 05/15/2004 Document Revised: 12/21/2011 Document Reviewed: 09/19/2008 [facility] Patient Information 2015 [place], [place]. This information is not intended to replace advice given to you by your health care provider. Make sure you discuss any questions you have with your health care provider.

## 2014-12-20 ENCOUNTER — Emergency Department (HOSPITAL_COMMUNITY)
Admission: EM | Admit: 2014-12-20 | Discharge: 2014-12-20 | Disposition: A | Discharge status: Home / Self Care | Payer: Self-pay | Attending: Emergency Medicine | Admitting: Emergency Medicine

## 2014-12-20 ENCOUNTER — Emergency Department (HOSPITAL_COMMUNITY): Payer: Self-pay

## 2014-12-20 ENCOUNTER — Encounter (HOSPITAL_COMMUNITY): Payer: Self-pay | Admitting: Emergency Medicine

## 2014-12-20 DIAGNOSIS — Z792 Long term (current) use of antibiotics: Secondary | ICD-10-CM | POA: Insufficient documentation

## 2014-12-20 DIAGNOSIS — X58XXXA Exposure to other specified factors, initial encounter: Secondary | ICD-10-CM | POA: Insufficient documentation

## 2014-12-20 DIAGNOSIS — Z8739 Personal history of other diseases of the musculoskeletal system and connective tissue: Secondary | ICD-10-CM | POA: Insufficient documentation

## 2014-12-20 DIAGNOSIS — Y9289 Other specified places as the place of occurrence of the external cause: Secondary | ICD-10-CM | POA: Insufficient documentation

## 2014-12-20 DIAGNOSIS — Z791 Long term (current) use of non-steroidal anti-inflammatories (NSAID): Secondary | ICD-10-CM | POA: Insufficient documentation

## 2014-12-20 DIAGNOSIS — R51 Headache: Secondary | ICD-10-CM | POA: Insufficient documentation

## 2014-12-20 DIAGNOSIS — S43401A Unspecified sprain of right shoulder joint, initial encounter: Secondary | ICD-10-CM | POA: Insufficient documentation

## 2014-12-20 DIAGNOSIS — Y9389 Activity, other specified: Secondary | ICD-10-CM | POA: Insufficient documentation

## 2014-12-20 DIAGNOSIS — Z86011 Personal history of benign neoplasm of the brain: Secondary | ICD-10-CM | POA: Insufficient documentation

## 2014-12-20 DIAGNOSIS — Y998 Other external cause status: Secondary | ICD-10-CM | POA: Insufficient documentation

## 2014-12-20 DIAGNOSIS — Z87891 Personal history of nicotine dependence: Secondary | ICD-10-CM | POA: Insufficient documentation

## 2014-12-20 HISTORY — DX: Unspecified rotator cuff tear or rupture of unspecified shoulder, not specified as traumatic: M75.100

## 2014-12-20 MED ORDER — HYDROCODONE-ACETAMINOPHEN 5-325 MG PO TABS: 1.0000 | ORAL_TABLET | ORAL | Status: DC | PRN

## 2014-12-20 MED ORDER — DICLOFENAC SODIUM 75 MG PO TBEC: 75.0000 mg | DELAYED_RELEASE_TABLET | Freq: Two times a day (BID) | ORAL | Status: DC

## 2014-12-20 NOTE — ED Notes (Signed)
Patient c/o right shoulder pain. 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 Per patient has torn rotator cuff x2 and pain feels the same.

## 2014-12-20 NOTE — ED Provider Notes (Signed)
CSN: [id number]     Arrival date & time 12/20/14  1102 History   First MD Initiated Contact with Patient 12/20/14 1316     Chief Complaint  Patient presents with  . Shoulder Pain     (Consider location/radiation/quality/duration/timing/severity/associated sxs/prior Treatment) HPI Comments: Patient is a 44 year old male who presents to the emergency department with complaint of right shoulder pain. 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 He states that he has had 2 rotator cuff surgeries on this particular shoulder. After this pulled he had a pain and burning sensation that would not resolve. He presents to the emergency department now with a complaint of shoulder pain, and requests to have evaluation of this pain.  Patient is a 44 y.o. male presenting with shoulder pain. The history is provided by the patient.  Shoulder Pain Location:  Shoulder Shoulder location:  R shoulder Associated symptoms: no back pain and no neck pain     Past Medical History  Diagnosis Date  . Brain tumor   . Rotator cuff tear    Past Surgical History  Procedure Laterality Date  . Sinus surgery with instatrak    . Cholecystectomy    . Rotator cuff repair    . Brain surgery     Family History  Problem Relation Age of Onset  . Cancer Father   . Heart failure Other    History  Substance Use Topics  . Smoking status: Former Smoker -- 2.00 packs/day for 12 years    Types: Cigarettes    Quit date: 10/12/2013  . Smokeless tobacco: Never Used  . Alcohol Use: No    Review of Systems  Constitutional: Negative for activity change.       All ROS Neg except as noted in HPI  HENT: Negative for nosebleeds.   Eyes: Negative for photophobia and discharge.  Respiratory: Negative for cough, shortness of breath and wheezing.   Cardiovascular: Negative for chest pain and palpitations.  Gastrointestinal: Negative for abdominal pain and blood in  stool.  Genitourinary: Negative for dysuria, frequency and hematuria.  Musculoskeletal: Positive for arthralgias. Negative for back pain and neck pain.  Skin: Negative.   Neurological: Positive for headaches. Negative for dizziness, seizures and speech difficulty.  Psychiatric/Behavioral: Negative for hallucinations and confusion.      Allergies  Review of patient's allergies indicates no known allergies.  Home Medications   Prior to Admission medications   Medication Sig Start Date End Date Taking? Authorizing Provider  antipyrine-benzocaine Toniann Fail) otic solution Place 3-4 drops into the right ear every 2 (two) hours as needed for ear pain. Patient not taking: Reported on 12/20/2014 11/22/14   Ezequiel Essex, MD  cephALEXin (KEFLEX) 500 MG capsule Take 2 capsules (1,000 mg total) by mouth 2 (two) times daily. Patient not taking: Reported on 12/20/2014 07/16/14   Evalee Jefferson, PA-C  HYDROcodone-acetaminophen (NORCO/VICODIN) 5-325 MG per tablet Take 1 tablet by mouth every 4 (four) hours as needed. Patient not taking: Reported on 12/20/2014 10/17/14   Evalee Jefferson, PA-C  ibuprofen (ADVIL,MOTRIN) 600 MG tablet Take 1 tablet (600 mg total) by mouth every 6 (six) hours as needed. Patient not taking: Reported on 12/20/2014 09/21/14   [name], MD  meloxicam (MOBIC) 7.5 MG tablet Take 1 tablet (7.5 mg total) by mouth daily. Patient not taking: Reported on 12/20/2014 09/04/13   Ashley Murrain, NP   BP 127/92 mmHg  Pulse 73  Temp(Src) 97.9  F (36.6 C) (Oral)  Resp 18  Ht 5\' 9"  (1.753 m)  Wt 210 lb (95.255 kg)  BMI 31.00 kg/m2  SpO2 98% Physical Exam  Constitutional: He is oriented to person, place, and time. He appears well-developed and well-nourished.  Non-toxic appearance.  HENT:  Head: Normocephalic.  Right Ear: Tympanic membrane and external ear normal.  Left Ear: Tympanic membrane and external ear normal.  Eyes: EOM and lids are normal. Pupils are equal, round, and reactive to  light.  Neck: Normal range of motion. Neck supple. Carotid bruit is not present.  Cardiovascular: Normal rate, regular rhythm, normal heart sounds, intact distal pulses and normal pulses.   Pulmonary/Chest: Breath sounds normal. No respiratory distress.  Abdominal: Soft. Bowel sounds are normal. There is no tenderness. There is no guarding.  Musculoskeletal: Normal range of motion.  There is full range of motion of the fingers and wrist and elbow of the right upper extremity. There is no atrophy noted of the thenar eminence, and there is no deformity or injury noted of the biceps or triceps area. There is pain with attempted range of motion of the right shoulder. There is pain to palpation of the anterior shoulder. There is pain over the acromioclavicular joint area. There is no hematoma appreciated. There is no deformity or dislocation of the scapula.  Lymphadenopathy:       Head (right side): No submandibular adenopathy present.       Head (left side): No submandibular adenopathy present.    He has no cervical adenopathy.  Neurological: He is alert and oriented to person, place, and time. He has normal strength. No cranial nerve deficit or sensory deficit.  Skin: Skin is warm and dry.  Psychiatric: He has a normal mood and affect. His speech is normal.  Nursing note and vitals reviewed.   ED Course  Procedures (including critical care time) Labs Review Labs Reviewed - No data to display  Imaging Review Dg Shoulder Right  12/20/2014   CLINICAL DATA:  Right shoulder pain, pulling injury today. Next evaluation.  EXAM: RIGHT SHOULDER - 2+ VIEW  COMPARISON:  None.  FINDINGS: Acromioclavicular separation cannot be excluded. No evidence of fracture or dislocation. No focal bony abnormality to  IMPRESSION: Acromioclavicular separation cannot be excluded. Repeat AP views of both shoulders with and without weights can be obtained to further evaluate as needed. No evidence of fracture or dislocation .    Electronically Signed   By: Marcello Moores  Register   On: 12/20/2014 13:17     EKG Interpretation None      MDM  Vital signs are within normal limits. X-ray of the right shoulder shows a possible acromioclavicular separation no fracture and no dislocation noted. Examination is negative for any acute neurovascular compromise or change.  The patient will be treated with a sling,  diclofenac, and Norco.    Final diagnoses:  None    **I have reviewed nursing notes, vital signs, and all appropriate lab and imaging results for this patient.Lily Kocher, PA-C 12/20/14 1358  Milton Ferguson, MD 12/20/14 [phone]

## 2014-12-20 NOTE — Discharge Instructions (Signed)
Please use the sling until seen by Dr. Tonita Cong or member of his team. Use diclofenac 2 times daily with food. May use Norco for more severe pain. This medication may cause drowsiness, please use with caution. Shoulder Sprain A shoulder sprain is the result of damage to the tough, fiber-like tissues (ligaments) that help hold your shoulder in place. The ligaments may be stretched or torn. Besides the main shoulder joint (the ball and socket), there are several smaller joints that connect the bones in this area. A sprain usually involves one of those joints. Most often it is the acromioclavicular (or AC) joint. That is the joint that connects the collarbone (clavicle) and the shoulder blade (scapula) at the top point of the shoulder blade (acromion). A shoulder sprain is a mild form of what is called a shoulder separation. Recovering from a shoulder sprain may take some time. For some, pain lingers for several months. Most people recover without long term problems. CAUSES   A shoulder sprain is usually caused by some kind of trauma. This might be:  Falling on an outstretched arm.  Being hit hard on the shoulder.  Twisting the arm.  Shoulder sprains are more likely to occur in people who:  Play sports.  Have balance or coordination problems. SYMPTOMS   Pain when you move your shoulder.  Limited ability to move the shoulder.  Swelling and tenderness on top of the shoulder.  Redness or warmth in the shoulder.  Bruising.  A change in the shape of the shoulder. DIAGNOSIS  Your healthcare provider may:  Ask about your symptoms.  Ask about recent activity that might have caused those symptoms.  Examine your shoulder. You may be asked to do simple exercises to test movement. The other shoulder will be examined for comparison.  Order some tests that provide a look inside the body. They can show the extent of the injury. The tests could include:  X-rays.  CT (computed tomography)  scan.  MRI (magnetic resonance imaging) scan. RISKS AND COMPLICATIONS  Loss of full shoulder motion.  Ongoing shoulder pain. TREATMENT  How long it takes to recover from a shoulder sprain depends on how severe it was. Treatment options may include:  Rest. You should not use the arm or shoulder until it heals.  Ice. For 2 or 3 days after the injury, put an ice pack on the shoulder up to 4 times a day. It should stay on for 15 to 20 minutes each time. Wrap the ice in a towel so it does not touch your skin.  Over-the-counter medicine to relieve pain.  A sling or brace. This will keep the arm still while the shoulder is healing.  Physical therapy or rehabilitation exercises. These will help you regain strength and motion. Ask your healthcare provider when it is OK to begin these exercises.  Surgery. The need for surgery is rare with a sprained shoulder, but some people may need surgery to keep the joint in place and reduce pain. HOME CARE INSTRUCTIONS   Ask your healthcare provider about what you should and should not do while your shoulder heals.  Make sure you know how to apply ice to the correct area of your shoulder.  Talk with your healthcare provider about which medications should be used for pain and swelling.  If rehabilitation therapy will be needed, ask your healthcare provider to refer you to a therapist. If it is not recommended, then ask about at-home exercises. Find out when exercise should begin.  SEEK MEDICAL CARE IF:  Your pain, swelling, or redness at the joint increases. SEEK IMMEDIATE MEDICAL CARE IF:   You have a fever.  You cannot move your arm or shoulder. Document Released: 02/14/2009 Document Revised: 12/21/2011 Document Reviewed: 02/14/2009 [facility] Patient Information 2015 [place], [place]. This information is not intended to replace advice given to you by your health care provider. Make sure you discuss any questions you have with your health care  provider.

## 2015-05-27 ENCOUNTER — Emergency Department (HOSPITAL_COMMUNITY)
Admission: EM | Admit: 2015-05-27 | Discharge: 2015-05-27 | Disposition: A | Discharge status: Home / Self Care | Payer: Self-pay | Attending: Emergency Medicine | Admitting: Emergency Medicine

## 2015-05-27 ENCOUNTER — Encounter (HOSPITAL_COMMUNITY): Payer: Self-pay | Admitting: Emergency Medicine

## 2015-05-27 DIAGNOSIS — S61411A Laceration without foreign body of right hand, initial encounter: Secondary | ICD-10-CM | POA: Insufficient documentation

## 2015-05-27 DIAGNOSIS — Y9289 Other specified places as the place of occurrence of the external cause: Secondary | ICD-10-CM | POA: Insufficient documentation

## 2015-05-27 DIAGNOSIS — Y288XXA Contact with other sharp object, undetermined intent, initial encounter: Secondary | ICD-10-CM | POA: Insufficient documentation

## 2015-05-27 DIAGNOSIS — S61412A Laceration without foreign body of left hand, initial encounter: Secondary | ICD-10-CM | POA: Insufficient documentation

## 2015-05-27 DIAGNOSIS — Z8739 Personal history of other diseases of the musculoskeletal system and connective tissue: Secondary | ICD-10-CM | POA: Insufficient documentation

## 2015-05-27 DIAGNOSIS — Y9389 Activity, other specified: Secondary | ICD-10-CM | POA: Insufficient documentation

## 2015-05-27 DIAGNOSIS — Z720 Tobacco use: Secondary | ICD-10-CM | POA: Insufficient documentation

## 2015-05-27 DIAGNOSIS — Y998 Other external cause status: Secondary | ICD-10-CM | POA: Insufficient documentation

## 2015-05-27 DIAGNOSIS — Z86018 Personal history of other benign neoplasm: Secondary | ICD-10-CM | POA: Insufficient documentation

## 2015-05-27 MED ORDER — LIDOCAINE HCL (PF) 1 % IJ SOLN
5.0000 mL | Freq: Once | INTRAMUSCULAR | Status: AC
  Administered 2015-05-27: 5 mL
  Filled 2015-05-27: qty 5

## 2015-05-27 MED ORDER — POVIDONE-IODINE 10 % EX SOLN
CUTANEOUS | Status: AC
  Filled 2015-05-27: qty 118

## 2015-05-27 NOTE — ED Notes (Signed)
Pt seen and evaluated by EDNP for initial assessment. 

## 2015-05-27 NOTE — ED Notes (Signed)
Pt cut hand on lawn mower blade-  Pad at base of thumb on Lt hand - small lac aprox 1cm - gapping

## 2015-05-27 NOTE — ED Provider Notes (Signed)
CSN: [id number]     Arrival date & time 05/27/15  1722 History  This chart was scribed for non-physician practitioner Debroah Baller, NP, working with Leonard Schwartz, MD by Zola Button, ED Scribe. This patient was seen in room APFT21/APFT21 and the patient's care was started at 6:01 PM.      Chief Complaint  Patient presents with  . Extremity Laceration   Patient is a 44 y.o. male presenting with skin laceration. The history is provided by the patient. No language interpreter was used.  Laceration Location:  Hand Hand laceration location:  L hand Length (cm):  1.5 Time since incident:  1 hour Laceration mechanism:  Metal edge Tetanus status:  Up to date  HPI Comments: Jose Little is a 44 y.o. male who presents to the Emergency Department complaining of a laceration to his left hand that occurred PTA after he cut his hand on a lawn mower blade while trying to fix it. The lawnmower was not running. His last tetanus shot was 2 years ago.   Past Medical History  Diagnosis Date  . Brain tumor   . Rotator cuff tear    Past Surgical History  Procedure Laterality Date  . Sinus surgery with instatrak    . Cholecystectomy    . Rotator cuff repair    . Brain surgery     Family History  Problem Relation Age of Onset  . Cancer Father   . Heart failure Other    Social History  Substance Use Topics  . Smoking status: Current Every Day Smoker -- 1.00 packs/day for 12 years    Types: Cigarettes  . Smokeless tobacco: Never Used  . Alcohol Use: No    Review of Systems  Constitutional: Negative for fever.  Musculoskeletal:       Laceration left hand  Skin: Positive for wound.  all other systems negative    Allergies  Review of patient's allergies indicates no known allergies.  Home Medications   Prior to Admission medications   Medication Sig Start Date End Date Taking? Authorizing Provider  Aspirin-Salicylamide-Caffeine (BC HEADACHE) 325-95-16 MG TABS Take 1 packet by mouth  daily as needed (for pain).   Yes Historical Provider, MD   BP 133/85 mmHg  Pulse 61  Temp(Src) 98.2 F (36.8 C) (Oral)  Resp 16  Ht 5\' 9"  (1.753 m)  Wt 230 lb (104.327 kg)  BMI 33.95 kg/m2  SpO2 98% Physical Exam  Constitutional: He is oriented to person, place, and time. He appears well-developed and well-nourished.  HENT:  Head: Normocephalic and atraumatic.  Eyes: Conjunctivae and EOM are normal.  Neck: Normal range of motion. Neck supple.  Cardiovascular: Normal rate.   Pulses:      Radial pulses are 2+ on the left side.  Adequate circulation.  Pulmonary/Chest: Effort normal.  Musculoskeletal: Normal range of motion.       Right hand: He exhibits laceration. He exhibits normal range of motion, no tenderness and normal capillary refill. Decreased sensation noted. Normal strength noted.       Hands: Neurological: He is alert and oriented to person, place, and time. No cranial nerve deficit.  Skin: Skin is warm and dry.  1.5 cm laceration to the thenar area of left hand.  Psychiatric: He has a normal mood and affect. His behavior is normal.  Nursing note and vitals reviewed.   ED Course  Procedures  DIAGNOSTIC STUDIES: Oxygen Saturation is 97% on room air, normal by my interpretation.  COORDINATION OF CARE: 6:03 PM-Discussed treatment plan which includes laceration repair with patient/guardian at bedside and patient/guardian agreed to plan.   LACERATION REPAIR PROCEDURE NOTE The patient's identification was confirmed and consent was obtained. This procedure was performed by Debroah Baller, NP  Site: left hand Sterile procedures observed Anesthetic used lidocaine 1 % without epi Suture type/size: 4-0 prolene Length:1.5 cm # of Sutures: 2 Technique: interrupted Complexity: simple Antibx ointment applied Tetanus UTD  Site anesthetized, irrigated with NS, explored without evidence of foreign body, wound well approximated, site covered with dry, sterile dressing.   Patient tolerated procedure well without complications. Instructions for care discussed verbally and patient provided with additional written instructions for homecare and f/u.    MDM  44 y.o. male with laceration to the left hand due to cut with lawnmower blade. Stable for d/c without neurovascular deficits. He will follow up in 7 days for suture removal or sooner for any problems.   Final diagnoses:  Laceration of hand, left, initial encounter    I personally performed the services described in this documentation, which was scribed in my presence. The recorded information has been reviewed and is accurate.   [address] [place], [place] 05/28/15 0626  Leonard Schwartz, MD 05/30/15 [phone]

## 2015-06-24 ENCOUNTER — Encounter (HOSPITAL_COMMUNITY): Payer: Self-pay | Admitting: Emergency Medicine

## 2015-06-24 DIAGNOSIS — Z720 Tobacco use: Secondary | ICD-10-CM | POA: Insufficient documentation

## 2015-06-24 DIAGNOSIS — Z86011 Personal history of benign neoplasm of the brain: Secondary | ICD-10-CM | POA: Insufficient documentation

## 2015-06-24 DIAGNOSIS — Z8739 Personal history of other diseases of the musculoskeletal system and connective tissue: Secondary | ICD-10-CM | POA: Insufficient documentation

## 2015-06-24 DIAGNOSIS — R109 Unspecified abdominal pain: Secondary | ICD-10-CM | POA: Insufficient documentation

## 2015-06-24 LAB — URINALYSIS, ROUTINE W REFLEX MICROSCOPIC
BILIRUBIN URINE: NEGATIVE
Glucose, UA: NEGATIVE mg/dL
HGB URINE DIPSTICK: NEGATIVE
KETONES UR: NEGATIVE mg/dL
Leukocytes, UA: NEGATIVE
NITRITE: NEGATIVE
Protein, ur: NEGATIVE mg/dL
SPECIFIC GRAVITY, URINE: 1.025 (ref 1.005–1.030)
UROBILINOGEN UA: 0.2 mg/dL (ref 0.0–1.0)
pH: 6.5 (ref 5.0–8.0)

## 2015-06-24 LAB — CBC WITH DIFFERENTIAL/PLATELET
BASOS ABS: 0 10*3/uL (ref 0.0–0.1)
BASOS PCT: 0 % (ref 0–1)
EOS ABS: 0.2 10*3/uL (ref 0.0–0.7)
EOS PCT: 2 % (ref 0–5)
HEMATOCRIT: 47.8 % (ref 39.0–52.0)
Hemoglobin: 16.2 g/dL (ref 13.0–17.0)
Lymphocytes Relative: 34 % (ref 12–46)
Lymphs Abs: 3.2 10*3/uL (ref 0.7–4.0)
MCH: 30 pg (ref 26.0–34.0)
MCHC: 33.9 g/dL (ref 30.0–36.0)
MCV: 88.5 fL (ref 78.0–100.0)
MONO ABS: 0.7 10*3/uL (ref 0.1–1.0)
MONOS PCT: 7 % (ref 3–12)
NEUTROS ABS: 5.4 10*3/uL (ref 1.7–7.7)
Neutrophils Relative %: 57 % (ref 43–77)
PLATELETS: 207 10*3/uL (ref 150–400)
RBC: 5.4 MIL/uL (ref 4.22–5.81)
RDW: 12.7 % (ref 11.5–15.5)
WBC: 9.4 10*3/uL (ref 4.0–10.5)

## 2015-06-24 LAB — COMPREHENSIVE METABOLIC PANEL
ALBUMIN: 4.6 g/dL (ref 3.5–5.0)
ALK PHOS: 54 U/L (ref 38–126)
ALT: 34 U/L (ref 17–63)
ANION GAP: 5 (ref 5–15)
AST: 24 U/L (ref 15–41)
BILIRUBIN TOTAL: 0.5 mg/dL (ref 0.3–1.2)
BUN: 23 mg/dL — AB (ref 6–20)
CALCIUM: 8.9 mg/dL (ref 8.9–10.3)
CO2: 27 mmol/L (ref 22–32)
CREATININE: 1.01 mg/dL (ref 0.61–1.24)
Chloride: 110 mmol/L (ref 101–111)
GFR calc Af Amer: >60 mL/min (ref >60)
GFR calc non Af Amer: >60 mL/min (ref >60)
GLUCOSE: 101 mg/dL — AB (ref 65–99)
Potassium: 3.7 mmol/L (ref 3.5–5.1)
SODIUM: 142 mmol/L (ref 135–145)
TOTAL PROTEIN: 6.9 g/dL (ref 6.5–8.1)

## 2015-06-24 NOTE — ED Notes (Signed)
Pt states that he has been having worstening abdo pain for past 2 weeks - no appetite - no nausea or vomiting. Pt stated that abdomen is swollen and "full of knots"

## 2015-06-25 ENCOUNTER — Emergency Department (HOSPITAL_COMMUNITY): Payer: Self-pay

## 2015-06-25 ENCOUNTER — Emergency Department (HOSPITAL_COMMUNITY)
Admission: EM | Admit: 2015-06-25 | Discharge: 2015-06-25 | Disposition: A | Discharge status: Home / Self Care | Payer: Self-pay | Attending: Emergency Medicine | Admitting: Emergency Medicine

## 2015-06-25 DIAGNOSIS — R109 Unspecified abdominal pain: Secondary | ICD-10-CM

## 2015-06-25 MED ORDER — SODIUM CHLORIDE 0.9 % IV SOLN: 1000.0000 mL | Freq: Once | INTRAVENOUS | Status: DC

## 2015-06-25 MED ORDER — SODIUM CHLORIDE 0.9 % IV SOLN: 1000.0000 mL | INTRAVENOUS | Status: DC

## 2015-06-25 MED ORDER — IOHEXOL 300 MG/ML  SOLN
50.0000 mL | Freq: Once | INTRAMUSCULAR | Status: AC | PRN
  Administered 2015-06-25: 50 mL via ORAL

## 2015-06-25 MED ORDER — PANTOPRAZOLE SODIUM 40 MG PO TBEC
40.0000 mg | DELAYED_RELEASE_TABLET | Freq: Once | ORAL | Status: AC
  Administered 2015-06-25: 40 mg via ORAL
  Filled 2015-06-25: qty 1

## 2015-06-25 MED ORDER — IOHEXOL 300 MG/ML  SOLN
100.0000 mL | Freq: Once | INTRAMUSCULAR | Status: AC | PRN
  Administered 2015-06-25: 100 mL via INTRAVENOUS

## 2015-06-25 MED ORDER — PANTOPRAZOLE SODIUM 40 MG PO TBEC: 40.0000 mg | DELAYED_RELEASE_TABLET | Freq: Every day | ORAL | Status: DC

## 2015-06-25 NOTE — Discharge Instructions (Signed)
Your CAT scan did not show an obvious cause for your pain. Please follow-up with Dr. Nevada Crane this week. You may need to see a gastroenterologist for further evaluation.   Abdominal Pain Many things can cause abdominal pain. Usually, abdominal pain is not caused by a disease and will improve without treatment. It can often be observed and treated at home. Your health care provider will do a physical exam and possibly order blood tests and X-rays to help determine the seriousness of your pain. However, in many cases, more time must pass before a clear cause of the pain can be found. Before that point, your health care provider may not know if you need more testing or further treatment. HOME CARE INSTRUCTIONS  Monitor your abdominal pain for any changes. The following actions may help to alleviate any discomfort you are experiencing:  Only take over-the-counter or prescription medicines as directed by your health care provider.  Do not take laxatives unless directed to do so by your health care provider.  Try a clear liquid diet (broth, tea, or water) as directed by your health care provider. Slowly move to a bland diet as tolerated. SEEK MEDICAL CARE IF:  You have unexplained abdominal pain.  You have abdominal pain associated with nausea or diarrhea.  You have pain when you urinate or have a bowel movement.  You experience abdominal pain that wakes you in the night.  You have abdominal pain that is worsened or improved by eating food.  You have abdominal pain that is worsened with eating fatty foods.  You have a fever. SEEK IMMEDIATE MEDICAL CARE IF:   Your pain does not go away within 2 hours.  You keep throwing up (vomiting).  Your pain is felt only in portions of the abdomen, such as the right side or the left lower portion of the abdomen.  You pass bloody or black tarry stools. MAKE SURE YOU:  Understand these instructions.   Will watch your condition.   Will get help  right away if you are not doing well or get worse.  Document Released: 07/08/2005 Document Revised: 10/03/2013 Document Reviewed: 06/07/2013 [facility] Patient Information 2015 [place], [place]. This information is not intended to replace advice given to you by your health care provider. Make sure you discuss any questions you have with your health care provider.  Pantoprazole tablets What is this medicine? PANTOPRAZOLE (pan TOE pra zole) prevents the production of acid in the stomach. It is used to treat gastroesophageal reflux disease (GERD), inflammation of the esophagus, and Zollinger-Ellison syndrome. This medicine may be used for other purposes; ask your health care provider or pharmacist if you have questions. COMMON BRAND NAME(S): Protonix What should I tell my health care provider before I take this medicine? They need to know if you have any of these conditions: -liver disease -low levels of magnesium in the blood -an unusual or allergic reaction to omeprazole, lansoprazole, pantoprazole, rabeprazole, other medicines, foods, dyes, or preservatives -pregnant or trying to get pregnant -breast-feeding How should I use this medicine? Take this medicine by mouth. Swallow the tablets whole with a drink of water. Follow the directions on the prescription label. Do not crush, break, or chew. Take your medicine at regular intervals. Do not take your medicine more often than directed. Talk to your pediatrician regarding the use of this medicine in children. While this drug may be prescribed for children as young as 5 years for selected conditions, precautions do apply. Overdosage: If you think you  have taken too much of this medicine contact a poison control center or emergency room at once. NOTE: This medicine is only for you. Do not share this medicine with others. What if I miss a dose? If you miss a dose, take it as soon as you can. If it is almost time for your next dose, take only that  dose. Do not take double or extra doses. What may interact with this medicine? Do not take this medicine with any of the following medications: -atazanavir -nelfinavir This medicine may also interact with the following medications: -ampicillin -delavirdine -digoxin -diuretics -iron salts -medicines for fungal infections like ketoconazole, itraconazole and voriconazole -warfarin This list may not describe all possible interactions. Give your health care provider a list of all the medicines, herbs, non-prescription drugs, or dietary supplements you use. Also tell them if you smoke, drink alcohol, or use illegal drugs. Some items may interact with your medicine. What should I watch for while using this medicine? It can take several days before your stomach pain gets better. Check with your doctor or health care professional if your condition does not start to get better, or if it gets worse. You may need blood work done while you are taking this medicine. What side effects may I notice from receiving this medicine? Side effects that you should report to your doctor or health care professional as soon as possible: -allergic reactions like skin rash, itching or hives, swelling of the face, lips, or tongue -bone, muscle or joint pain -breathing problems -chest pain or chest tightness -dark yellow or brown urine -dizziness -fast, irregular heartbeat -feeling faint or lightheaded -fever or sore throat -muscle spasm -palpitations -redness, blistering, peeling or loosening of the skin, including inside the mouth -seizures -tremors -unusual bleeding or bruising -unusually weak or tired -yellowing of the eyes or skin Side effects that usually do not require medical attention (Report these to your doctor or health care professional if they continue or are bothersome.): -constipation -diarrhea -dry mouth -headache -nausea This list may not describe all possible side effects. Call your  doctor for medical advice about side effects. You may report side effects to FDA at 1-800-FDA-1088. Where should I keep my medicine? Keep out of the reach of children. Store at room temperature between 15 and 30 degrees C (59 and 86 degrees F). Protect from light and moisture. Throw away any unused medicine after the expiration date. NOTE: This sheet is a summary. It may not cover all possible information. If you have questions about this medicine, talk to your doctor, pharmacist, or health care provider.  2015, [employer]vier/[employer] Standard. (2012-07-27 16:40:16)

## 2015-06-25 NOTE — ED Notes (Signed)
Pt gone over to CT 

## 2015-06-25 NOTE — ED Provider Notes (Signed)
CSN: [id number]     Arrival date & time 06/24/15  2107 History   First MD Initiated Contact with Patient 06/25/15 [phone]     Chief Complaint  Patient presents with  . Abdominal Pain     (Consider location/radiation/quality/duration/timing/severity/associated sxs/prior Treatment) Patient is a 44 y.o. male presenting with abdominal pain. The history is provided by the patient.  Abdominal Pain He complains of crampy left mid abdominal pain for the last 3 weeks. Pain is been getting worse. It is worse after each or drinks anything and he states that his abdomen gets distended after drinking. There is no nausea or vomiting. Denies constipation or diarrhea. There's been no fever or chills. He rates pain at 7/10. He is not taken any medication to help. He has not had any similar episodes in the past.  Past Medical History  Diagnosis Date  . Brain tumor   . Rotator cuff tear    Past Surgical History  Procedure Laterality Date  . Sinus surgery with instatrak    . Cholecystectomy    . Rotator cuff repair    . Brain surgery     Family History  Problem Relation Age of Onset  . Cancer Father   . Heart failure Other    Social History  Substance Use Topics  . Smoking status: Current Every Day Smoker -- 1.00 packs/day for 12 years    Types: Cigarettes  . Smokeless tobacco: Never Used  . Alcohol Use: No    Review of Systems  Gastrointestinal: Positive for abdominal pain.  All other systems reviewed and are negative.     Allergies  Review of patient's allergies indicates no known allergies.  Home Medications   Prior to Admission medications   Medication Sig Start Date End Date Taking? Authorizing Provider  Aspirin-Salicylamide-Caffeine (BC HEADACHE) 325-95-16 MG TABS Take 1 packet by mouth daily as needed (for pain).    Historical Provider, MD   BP 153/90 mmHg  Pulse 76  Temp(Src) 98.1 F (36.7 C) (Oral)  Resp 14  Ht 5\' 9"  (1.753 m)  Wt 230 lb (104.327 kg)  BMI 33.95 kg/m2   SpO2 98% Physical Exam  Nursing note and vitals reviewed.  44 year old male, resting comfortably and in no acute distress. Vital signs are significant for hypertension. Oxygen saturation is 98%, which is normal. Head is normocephalic and atraumatic. PERRLA, EOMI. Oropharynx is clear. Neck is nontender and supple without adenopathy or JVD. Back is nontender and there is no CVA tenderness. Lungs are clear without rales, wheezes, or rhonchi. Chest is nontender. Heart has regular rate and rhythm without murmur. Abdomen is soft, flat, with mild tenderness in the left midabdomen. There is a rebound or guarding. There are subcutaneous nodules palpable of the left mid abdomen in the epigastric area. These are nontender and freely mobile. There is no hepatosplenomegaly and peristalsis is normoactive. Extremities have no cyanosis or edema, full range of motion is present. Skin is warm and dry without rash. Neurologic: Mental status is normal, cranial nerves are intact, there are no motor or sensory deficits.  ED Course  Procedures (including critical care time) Labs Review Results for orders placed or performed during the hospital encounter of 06/25/15  Comprehensive metabolic panel  Result Value Ref Range   Sodium 142 135 - 145 mmol/L   Potassium 3.7 3.5 - 5.1 mmol/L   Chloride 110 101 - 111 mmol/L   CO2 27 22 - 32 mmol/L   Glucose, Bld 101 (H) 65 -  99 mg/dL   BUN 23 (H) 6 - 20 mg/dL   Creatinine, Ser 1.01 0.61 - 1.24 mg/dL   Calcium 8.9 8.9 - 10.3 mg/dL   Total Protein 6.9 6.5 - 8.1 g/dL   Albumin 4.6 3.5 - 5.0 g/dL   AST 24 15 - 41 U/L   ALT 34 17 - 63 U/L   Alkaline Phosphatase 54 38 - 126 U/L   Total Bilirubin 0.5 0.3 - 1.2 mg/dL   GFR calc non Af Amer >60 >60 mL/min   GFR calc Af Amer >60 >60 mL/min   Anion gap 5 5 - 15  CBC WITH DIFFERENTIAL  Result Value Ref Range   WBC 9.4 4.0 - 10.5 K/uL   RBC 5.40 4.22 - 5.81 MIL/uL   Hemoglobin 16.2 13.0 - 17.0 g/dL   HCT 47.8 39.0 -  52.0 %   MCV 88.5 78.0 - 100.0 fL   MCH 30.0 26.0 - 34.0 pg   MCHC 33.9 30.0 - 36.0 g/dL   RDW 12.7 11.5 - 15.5 %   Platelets 207 150 - 400 K/uL   Neutrophils Relative % 57 43 - 77 %   Neutro Abs 5.4 1.7 - 7.7 K/uL   Lymphocytes Relative 34 12 - 46 %   Lymphs Abs 3.2 0.7 - 4.0 K/uL   Monocytes Relative 7 3 - 12 %   Monocytes Absolute 0.7 0.1 - 1.0 K/uL   Eosinophils Relative 2 0 - 5 %   Eosinophils Absolute 0.2 0.0 - 0.7 K/uL   Basophils Relative 0 0 - 1 %   Basophils Absolute 0.0 0.0 - 0.1 K/uL  Urinalysis, Routine w reflex microscopic (not at [facility])  Result Value Ref Range   Color, Urine YELLOW YELLOW   APPearance CLEAR CLEAR   Specific Gravity, Urine 1.025 1.005 - 1.030   pH 6.5 5.0 - 8.0   Glucose, UA NEGATIVE NEGATIVE mg/dL   Hgb urine dipstick NEGATIVE NEGATIVE   Bilirubin Urine NEGATIVE NEGATIVE   Ketones, ur NEGATIVE NEGATIVE mg/dL   Protein, ur NEGATIVE NEGATIVE mg/dL   Urobilinogen, UA 0.2 0.0 - 1.0 mg/dL   Nitrite NEGATIVE NEGATIVE   Leukocytes, UA NEGATIVE NEGATIVE   Imaging Review Ct Abdomen Pelvis W Contrast  06/25/2015   CLINICAL DATA:  Progressive abdominal pain for 2 weeks with diminished appetite.  EXAM: CT ABDOMEN AND PELVIS WITH CONTRAST  TECHNIQUE: Multidetector CT imaging of the abdomen and pelvis was performed using the standard protocol following bolus administration of intravenous contrast.  CONTRAST:  37mL OMNIPAQUE IOHEXOL 300 MG/ML SOLN, 176mL OMNIPAQUE IOHEXOL 300 MG/ML SOLN  COMPARISON:  CT 03/18/2011  FINDINGS: Minimal chronic ground-glass opacity at the left lung base. Lung bases are otherwise clear.  Decreased hepatic density consistent with steatosis. No focal lesion. Clips in the gallbladder fossa from cholecystectomy. No biliary dilatation. The spleen, pancreas, and adrenal glands are normal. Previous low-attenuation focus in the spleen is no longer seen. Symmetric renal enhancement and excretion. Left kidney again appears ptotic. There is  nonobstructing stone in the lower right kidney.  The stomach is physiologically distended. There are no dilated or thickened bowel loops. The appendix is normal. Small volume of colonic stool without colonic wall thickening. No free air, free fluid, or intra-abdominal fluid collection.  No retroperitoneal adenopathy. The previous enlarged gastrohepatic ligament lymph node is no longer seen. There is no mesenteric adenopathy. Abdominal aorta is normal in caliber. There is a circum aortic left renal vein.  Within the pelvis the bladder is  decompressed. Prostate gland is normal in size with central prostatic calcifications. Minimal fat in the right inguinal canal. There is no pelvic adenopathy.  There are no acute or suspicious osseous abnormalities.  IMPRESSION: 1. No acute abnormality in the abdomen/pelvis. 2. Incidental findings of nonobstructing right renal calculus and hepatic steatosis.   Electronically Signed   By: Jeb Levering M.D.   On: 06/25/2015 04:13   I have personally reviewed and evaluated these images and lab results as part of my medical decision-making.  MDM   Final diagnoses:  Abdominal pain, unspecified abdominal location    Abdominal pain of uncertain cause. Laboratory workup is significant only for elevated BUN which may from lack of oral fluids because of his abdominal pain. He will be given IV fluids and sent for CT of abdomen and pelvis. Old records are reviewed, and she did have an ED visit for abdominal pain in 2012 with negative CT scan at that time.  CT scan as he can unremarkable. He may need endoscopy for further evaluation. His discharged with prescription for pantoprazole and is referred back to his PCP.  Delora Fuel, MD 58/30/94 0768

## 2016-05-18 ENCOUNTER — Encounter (HOSPITAL_COMMUNITY): Payer: Self-pay | Admitting: Emergency Medicine

## 2016-05-18 ENCOUNTER — Emergency Department (HOSPITAL_COMMUNITY)
Admission: EM | Admit: 2016-05-18 | Discharge: 2016-05-18 | Disposition: A | Discharge status: Home / Self Care | Payer: BLUE CROSS/BLUE SHIELD | Attending: Emergency Medicine | Admitting: Emergency Medicine

## 2016-05-18 ENCOUNTER — Emergency Department (HOSPITAL_COMMUNITY): Payer: BLUE CROSS/BLUE SHIELD

## 2016-05-18 DIAGNOSIS — J40 Bronchitis, not specified as acute or chronic: Secondary | ICD-10-CM | POA: Diagnosis not present

## 2016-05-18 DIAGNOSIS — R05 Cough: Secondary | ICD-10-CM | POA: Diagnosis present

## 2016-05-18 DIAGNOSIS — F17210 Nicotine dependence, cigarettes, uncomplicated: Secondary | ICD-10-CM | POA: Diagnosis not present

## 2016-05-18 MED ORDER — IPRATROPIUM BROMIDE 0.02 % IN SOLN
0.5000 mg | Freq: Once | RESPIRATORY_TRACT | Status: AC
  Administered 2016-05-18: 0.5 mg via RESPIRATORY_TRACT
  Filled 2016-05-18: qty 2.5

## 2016-05-18 MED ORDER — ALBUTEROL SULFATE HFA 108 (90 BASE) MCG/ACT IN AERS
2.0000 | INHALATION_SPRAY | Freq: Once | RESPIRATORY_TRACT | Status: AC
  Administered 2016-05-18: 2 via RESPIRATORY_TRACT
  Filled 2016-05-18: qty 6.7

## 2016-05-18 MED ORDER — PREDNISONE 50 MG PO TABS: ORAL_TABLET | ORAL | 0 refills | Status: DC

## 2016-05-18 MED ORDER — ALBUTEROL SULFATE (2.5 MG/3ML) 0.083% IN NEBU
5.0000 mg | INHALATION_SOLUTION | Freq: Once | RESPIRATORY_TRACT | Status: AC
  Administered 2016-05-18: 5 mg via RESPIRATORY_TRACT
  Filled 2016-05-18: qty 6

## 2016-05-18 MED ORDER — PREDNISONE 10 MG PO TABS
60.0000 mg | ORAL_TABLET | Freq: Once | ORAL | Status: AC
  Administered 2016-05-18: 60 mg via ORAL
  Filled 2016-05-18: qty 1

## 2016-05-18 NOTE — ED Notes (Signed)
Patient transported to X-ray 

## 2016-05-18 NOTE — ED Triage Notes (Signed)
Pt reports cough and intermittent shortness of breath since Saturday. Pt reports not able to cough anything up. Upon auscultation diminished breath sounds heard in all lung fields.

## 2016-05-18 NOTE — ED Notes (Signed)
Ambulated pt around nursing station. O2 sats remain 94 to 97%.

## 2016-05-18 NOTE — ED Provider Notes (Signed)
[facility] DEPT Provider Note   CSN: [id number] Arrival date & time: 05/18/16  1840  First Provider Contact:  First MD Initiated Contact with Patient 05/18/16 1913        History   Chief Complaint Chief Complaint  Patient presents with  . Cough    HPI Jose Little is a 45 y.o. male.  The history is provided by the patient.  Cough  This is a new problem. The current episode started more than 2 days ago. The problem has been gradually worsening. The maximum temperature recorded prior to his arrival was 103 to 104 F. Associated symptoms include chest pain, chills and shortness of breath.  Patient reports cough/SOB for past several days He also reports CP associated with cough No hemoptysis He reports fever up to 103 at home this past weekend (24-48 hours ago)  He also reports soreness in his abdomen - he reports areas on his abdomen that are painful and feel like "Cysts"    Past Medical History:  Diagnosis Date  . Brain tumor ([place])   . Rotator cuff tear     Patient Active Problem List   Diagnosis Date Noted  . KNEE, ARTHRITIS, 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  . KNEE PAIN 09/05/2008    Past Surgical History:  Procedure Laterality Date  . BRAIN SURGERY    . CHOLECYSTECTOMY    . ROTATOR CUFF REPAIR    . SINUS SURGERY WITH INSTATRAK         Home Medications    Prior to Admission medications   Not on File    Family History Family History  Problem Relation Age of Onset  . Cancer Father   . Heart failure Other     Social History Social History  Substance Use Topics  . Smoking status: Current Every Day Smoker    Packs/day: 1.00    Years: 12.00    Types: Cigarettes  . Smokeless tobacco: Never Used  . Alcohol use No     Allergies   Review of patient's allergies indicates no known allergies.   Review of Systems Review of Systems  Constitutional: Positive for chills.  Respiratory: Positive for cough and shortness of breath.   Cardiovascular:  Positive for chest pain.  All other systems reviewed and are negative.    Physical Exam Updated Vital Signs BP 157/93 (BP Location: Left Arm)   Pulse 73   Temp 98 F (36.7 C) (Oral)   Resp 16   Ht 5\' 9"  (1.753 m)   Wt 104.3 kg   SpO2 98%   BMI 33.97 kg/m   Physical Exam CONSTITUTIONAL: Well developed/well nourished HEAD: Normocephalic/atraumatic EYES: EOMI/PERRL ENMT: Mucous membranes moist NECK: supple no meningeal signs SPINE/BACK:entire spine nontender CV: S1/S2 noted, no murmurs/rubs/gallops noted LUNGS: decreased BS noted, coarse wheeze noted bilaterally ABDOMEN: soft, nontender, no rebound or guarding, bowel sounds noted throughout abdomen, no hernia noted, ?lipomas noted to abdominal wall GU:no cva tenderness NEURO: Pt is awake/alert/appropriate, moves all extremitiesx4. EXTREMITIES: pulses normal/equal, full ROM SKIN: warm, color normal PSYCH: no abnormalities of mood noted, alert and oriented to situation   ED Treatments / Results  Labs (all labs ordered are listed, but only abnormal results are displayed) Labs Reviewed - No data to display  EKG  EKG Interpretation  Date/Time:  Monday May 18 2016 19:36:49 EDT Ventricular Rate:  66 PR Interval:    QRS Duration: 83 QT Interval:  376 QTC Calculation: 394 R Axis:   75 Text Interpretation:  Sinus rhythm Non-specific ST-t  changes artifact noted Confirmed by Christy Gentles  MD, [place] ([id number]) on 05/18/2016 7:41:33 PM       Radiology Dg Chest 2 View  Result Date: 05/18/2016 CLINICAL DATA:  Cough, shortness of Breath EXAM: CHEST  2 VIEW COMPARISON:  03/11/2011 FINDINGS: Cardiomediastinal silhouette is stable. No acute infiltrate or pleural effusion. No pulmonary edema. Bilateral nipple rings are noted. IMPRESSION: No active cardiopulmonary disease. Electronically Signed   By: Lahoma Crocker M.D.   On: 05/18/2016 19:36    Procedures Procedures (including critical care time)  Medications Ordered in ED Medications    albuterol (PROVENTIL) (2.5 MG/3ML) 0.083% nebulizer solution 5 mg (5 mg Nebulization Given 05/18/16 1936)  ipratropium (ATROVENT) nebulizer solution 0.5 mg (0.5 mg Nebulization Given 05/18/16 1936)  predniSONE (DELTASONE) tablet 60 mg (60 mg Oral Given 05/18/16 1922)  albuterol (PROVENTIL HFA;VENTOLIN HFA) 108 (90 Base) MCG/ACT inhaler 2 puff (2 puffs Inhalation Given 05/18/16 2027)     Initial Impression / Assessment and Plan / ED Course  I have reviewed the triage vital signs and the nursing notes.  Pertinent labs & imaging results that were available during my care of the patient were reviewed by me and considered in my medical decision making (see chart for details).  Clinical Course    Pt improved with treatment Lung sounds improved He ambulated without difficulty, no hypoxia His lung sounds are improved Will d/c home Advised to QUIT smoking He also request referral to gen. Surgery for ?lipomas   Final Clinical Impressions(s) / ED Diagnoses   Final diagnoses:  Bronchitis    New Prescriptions New Prescriptions   No medications on file     Ripley Fraise, MD 05/18/16 2039

## 2016-05-20 ENCOUNTER — Emergency Department (HOSPITAL_COMMUNITY)
Admission: EM | Admit: 2016-05-20 | Discharge: 2016-05-20 | Disposition: A | Discharge status: Home / Self Care | Payer: BLUE CROSS/BLUE SHIELD | Attending: Emergency Medicine | Admitting: Emergency Medicine

## 2016-05-20 ENCOUNTER — Encounter (HOSPITAL_COMMUNITY): Payer: Self-pay | Admitting: Emergency Medicine

## 2016-05-20 DIAGNOSIS — R0602 Shortness of breath: Secondary | ICD-10-CM | POA: Diagnosis present

## 2016-05-20 DIAGNOSIS — J9801 Acute bronchospasm: Secondary | ICD-10-CM | POA: Insufficient documentation

## 2016-05-20 DIAGNOSIS — Z79899 Other long term (current) drug therapy: Secondary | ICD-10-CM | POA: Insufficient documentation

## 2016-05-20 DIAGNOSIS — J40 Bronchitis, not specified as acute or chronic: Secondary | ICD-10-CM | POA: Insufficient documentation

## 2016-05-20 DIAGNOSIS — F17210 Nicotine dependence, cigarettes, uncomplicated: Secondary | ICD-10-CM | POA: Insufficient documentation

## 2016-05-20 DIAGNOSIS — R0789 Other chest pain: Secondary | ICD-10-CM | POA: Insufficient documentation

## 2016-05-20 DIAGNOSIS — J209 Acute bronchitis, unspecified: Secondary | ICD-10-CM

## 2016-05-20 MED ORDER — METHYLPREDNISOLONE SODIUM SUCC 125 MG IJ SOLR
80.0000 mg | Freq: Once | INTRAMUSCULAR | Status: AC
  Administered 2016-05-20: 80 mg via INTRAMUSCULAR
  Filled 2016-05-20: qty 2

## 2016-05-20 MED ORDER — KETOROLAC TROMETHAMINE 60 MG/2ML IM SOLN
60.0000 mg | Freq: Once | INTRAMUSCULAR | Status: AC
  Administered 2016-05-20: 60 mg via INTRAMUSCULAR
  Filled 2016-05-20: qty 2

## 2016-05-20 MED ORDER — ALBUTEROL SULFATE (2.5 MG/3ML) 0.083% IN NEBU
2.5000 mg | INHALATION_SOLUTION | Freq: Once | RESPIRATORY_TRACT | Status: AC
  Administered 2016-05-20: 2.5 mg via RESPIRATORY_TRACT
  Filled 2016-05-20: qty 3

## 2016-05-20 MED ORDER — ALBUTEROL SULFATE HFA 108 (90 BASE) MCG/ACT IN AERS: 1.0000 | INHALATION_SPRAY | Freq: Four times a day (QID) | RESPIRATORY_TRACT | 0 refills | Status: DC | PRN

## 2016-05-20 MED ORDER — IPRATROPIUM-ALBUTEROL 0.5-2.5 (3) MG/3ML IN SOLN
3.0000 mL | Freq: Once | RESPIRATORY_TRACT | Status: AC
  Administered 2016-05-20: 3 mL via RESPIRATORY_TRACT
  Filled 2016-05-20: qty 3

## 2016-05-20 MED ORDER — ALBUTEROL (5 MG/ML) CONTINUOUS INHALATION SOLN
10.0000 mg/h | INHALATION_SOLUTION | Freq: Once | RESPIRATORY_TRACT | Status: AC
  Administered 2016-05-20: 10 mg/h via RESPIRATORY_TRACT
  Filled 2016-05-20: qty 20

## 2016-05-20 MED ORDER — BENZONATATE 200 MG PO CAPS: 200.0000 mg | ORAL_CAPSULE | Freq: Three times a day (TID) | ORAL | 0 refills | Status: DC | PRN

## 2016-05-20 NOTE — ED Provider Notes (Signed)
[facility] DEPT Provider Note   CSN: [id number] Arrival date & time: 05/20/16  [id number]  First Provider Contact:  First MD Initiated Contact with Patient 05/20/16 [id number]        History   Chief Complaint Chief Complaint  Patient presents with  . Shortness of Breath    HPI Jose Little is a 45 y.o. male.  HPI   Jose Little is a 45 y.o. male who presents to the Emergency Department complaining of continued chest tightness and shortness of breath.  He was seen here two days ago for same. He is currently using an albuterol inhaler at home and taking prednisone 50 mg daily.  He complains of wheezing, cough and shortness of breath with exertion. Cough is keeping him awake at night.  He states symptoms have been present for one week and associated earlier this week with fever in 103 at home.  He denies productive cough, continued fever, vomiting, or hemoptysis   Past Medical History:  Diagnosis Date  . Brain tumor ([place])   . Rotator cuff tear     Patient Active Problem List   Diagnosis Date Noted  . KNEE, ARTHRITIS, 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  . KNEE PAIN 09/05/2008    Past Surgical History:  Procedure Laterality Date  . BRAIN SURGERY    . CHOLECYSTECTOMY    . ROTATOR CUFF REPAIR    . SINUS SURGERY WITH INSTATRAK         Home Medications    Prior to Admission medications   Medication Sig Start Date End Date Taking? Authorizing Provider  predniSONE (DELTASONE) 50 MG tablet One tablet PO daily for 4 days Patient not taking: Reported on 05/20/2016 05/18/16   Ripley Fraise, MD    Family History Family History  Problem Relation Age of Onset  . Cancer Father   . Heart failure Other     Social History Social History  Substance Use Topics  . Smoking status: Current Every Day Smoker    Packs/day: 0.50    Years: 12.00    Types: Cigarettes  . Smokeless tobacco: Never Used  . Alcohol use No     Allergies   Review of patient's allergies indicates no known  allergies.   Review of Systems Review of Systems  Constitutional: Positive for fever. Negative for appetite change and chills.  HENT: Positive for congestion. Negative for ear pain, sore throat and trouble swallowing.   Respiratory: Positive for cough, chest tightness, shortness of breath and wheezing.   Cardiovascular: Negative for chest pain.  Gastrointestinal: Negative for abdominal pain, nausea and vomiting.  Genitourinary: Negative for dysuria.  Musculoskeletal: Negative for arthralgias.  Skin: Negative for rash.  Neurological: Negative for dizziness, weakness and numbness.  Hematological: Negative for adenopathy.  All other systems reviewed and are negative.    Physical Exam Updated Vital Signs BP 124/71 (BP Location: Left Arm)   Pulse 102   Temp 97.7 F (36.5 C) (Oral)   Resp 14   Ht 5\' 9"  (1.753 m)   Wt 104.3 kg   SpO2 94%   BMI 33.97 kg/m   Physical Exam  Constitutional: He is oriented to person, place, and time. He appears well-developed and well-nourished. No distress.  HENT:  Head: Normocephalic and atraumatic.  Right Ear: Tympanic membrane and ear canal normal.  Left Ear: Tympanic membrane and ear canal normal.  Mouth/Throat: Uvula is midline, oropharynx is clear and moist and mucous membranes are normal. No oropharyngeal exudate.  Eyes: EOM are normal. Pupils are  equal, round, and reactive to light.  Neck: Normal range of motion, full passive range of motion without pain and phonation normal. Neck supple.  Cardiovascular: Normal rate, regular rhythm and intact distal pulses.   No murmur heard. Pulmonary/Chest: Effort normal. No stridor. No respiratory distress. He has wheezes. He has no rales. He exhibits no tenderness.  Few inspiratory and mostly expiratory wheezes bilaterally. No rales   Abdominal: Soft. He exhibits no distension. There is no tenderness.  Musculoskeletal: Normal range of motion. He exhibits no edema.  Lymphadenopathy:    He has no  cervical adenopathy.  Neurological: He is alert and oriented to person, place, and time. He exhibits normal muscle tone. Coordination normal.  Skin: Skin is warm and dry.  Psychiatric: He has a normal mood and affect.  Nursing note and vitals reviewed.    ED Treatments / Results  Labs (all labs ordered are listed, but only abnormal results are displayed) Labs Reviewed - No data to display  EKG  EKG Interpretation  Date/Time:  Wednesday May 20 2016 09:27:26 EDT Ventricular Rate:  85 PR Interval:    QRS Duration: 86 QT Interval:  343 QTC Calculation: 408 R Axis:   72 Text Interpretation:  Sinus rhythm Confirmed by Jeneen Rinks  MD, [place] ([id number]) on 05/20/2016 9:33:37 AM       Radiology Dg Chest 2 View  Result Date: 05/18/2016 CLINICAL DATA:  Cough, shortness of Breath EXAM: CHEST  2 VIEW COMPARISON:  03/11/2011 FINDINGS: Cardiomediastinal silhouette is stable. No acute infiltrate or pleural effusion. No pulmonary edema. Bilateral nipple rings are noted. IMPRESSION: No active cardiopulmonary disease. Electronically Signed   By: Lahoma Crocker M.D.   On: 05/18/2016 19:36    Procedures Procedures (including critical care time)  Medications Ordered in ED Medications  ipratropium-albuterol (DUONEB) 0.5-2.5 (3) MG/3ML nebulizer solution 3 mL (3 mLs Nebulization Given 05/20/16 1029)  albuterol (PROVENTIL) (2.5 MG/3ML) 0.083% nebulizer solution 2.5 mg (2.5 mg Nebulization Given 05/20/16 1028)  ketorolac (TORADOL) injection 60 mg (60 mg Intramuscular Given 05/20/16 1001)  albuterol (PROVENTIL,VENTOLIN) solution continuous neb (10 mg/hr Nebulization Given 05/20/16 1149)  methylPREDNISolone sodium succinate (SOLU-MEDROL) 125 mg/2 mL injection 80 mg (80 mg Intramuscular Given 05/20/16 1204)     Initial Impression / Assessment and Plan / ED Course  I have reviewed the triage vital signs and the nursing notes.  Pertinent labs & imaging results that were available during my care of the patient were  reviewed by me and considered in my medical decision making (see chart for details).  Clinical Course   Pt is non-toxic appearing.  No distress.  No tachycardia or tachypnea.  speaking in full sentences without respiratory distress.    CXR from two days ago reviewed by me and neg for PNA.     On recheck, lung sounds greatly improved after continuous neb.     Pt appears stable for d/c.  Ambulates well and maintains O2 sat > 90%.  He agrees to continue his prednisone and inhaler as directed and I will prescribe tessalon  for symptomatic relief from nighttime cough.    Final Clinical Impressions(s) / ED Diagnoses   Final diagnoses:  Bronchitis with bronchospasm    New Prescriptions New Prescriptions   No medications on file     Bufford Lope 05/20/16 1246    Tanna Furry, MD 05/26/16 1122

## 2016-05-20 NOTE — Discharge Instructions (Signed)
Continue taking your prednisone as directed.  2 puffs of your inhaler 4 times a day as needed.  Call Dr. Nevada Crane to arrange a follow-up appt.

## 2016-05-20 NOTE — ED Triage Notes (Signed)
Pt reports increasing SOB especially when lying down or with exertion. Pt seen in ED for same complain on Mon. States he was dx with Bronchitis and sent home with antibiotics. Denies any productive cough.

## 2016-05-20 NOTE — ED Notes (Signed)
Ambulated patient. O2 saturation 94%, pulse 100 bpm while ambulating. Patient ambulated with no assistance or difficulty.

## 2016-05-20 NOTE — ED Notes (Signed)
02 sat 94 % when ambulating

## 2017-06-07 ENCOUNTER — Emergency Department (HOSPITAL_COMMUNITY)
Admission: EM | Admit: 2017-06-07 | Discharge: 2017-06-07 | Disposition: A | Discharge status: Home / Self Care | Payer: BLUE CROSS/BLUE SHIELD | Attending: Emergency Medicine | Admitting: Emergency Medicine

## 2017-06-07 ENCOUNTER — Encounter (HOSPITAL_COMMUNITY): Payer: Self-pay | Admitting: *Deleted

## 2017-06-07 DIAGNOSIS — D496 Neoplasm of unspecified behavior of brain: Secondary | ICD-10-CM | POA: Insufficient documentation

## 2017-06-07 DIAGNOSIS — K047 Periapical abscess without sinus: Secondary | ICD-10-CM | POA: Diagnosis not present

## 2017-06-07 DIAGNOSIS — K029 Dental caries, unspecified: Secondary | ICD-10-CM | POA: Insufficient documentation

## 2017-06-07 DIAGNOSIS — F17210 Nicotine dependence, cigarettes, uncomplicated: Secondary | ICD-10-CM | POA: Diagnosis not present

## 2017-06-07 DIAGNOSIS — K0889 Other specified disorders of teeth and supporting structures: Secondary | ICD-10-CM | POA: Diagnosis present

## 2017-06-07 MED ORDER — IBUPROFEN 800 MG PO TABS: 800.0000 mg | ORAL_TABLET | Freq: Three times a day (TID) | ORAL | 0 refills | Status: DC

## 2017-06-07 MED ORDER — HYDROCODONE-ACETAMINOPHEN 5-325 MG PO TABS: 1.0000 | ORAL_TABLET | ORAL | 0 refills | Status: DC | PRN

## 2017-06-07 MED ORDER — AMOXICILLIN-POT CLAVULANATE 875-125 MG PO TABS
1.0000 | ORAL_TABLET | Freq: Once | ORAL | Status: AC
  Administered 2017-06-07: 1 via ORAL
  Filled 2017-06-07: qty 1

## 2017-06-07 MED ORDER — TRAMADOL HCL 50 MG PO TABS: 50.0000 mg | ORAL_TABLET | Freq: Four times a day (QID) | ORAL | 0 refills | Status: DC | PRN

## 2017-06-07 MED ORDER — AMOXICILLIN-POT CLAVULANATE 875-125 MG PO TABS: 1.0000 | ORAL_TABLET | Freq: Two times a day (BID) | ORAL | 0 refills | Status: DC

## 2017-06-07 MED FILL — Hydrocodone-Acetaminophen Tab 5-325 MG: ORAL | Qty: 6 | Status: AC

## 2017-06-07 NOTE — ED Triage Notes (Signed)
Pt c/o left upper dental pain that started yesterday,

## 2017-06-07 NOTE — ED Provider Notes (Signed)
[facility] DEPT Provider Note   CSN: [id number] Arrival date & time: 06/07/17  0222     History   Chief Complaint Chief Complaint  Patient presents with  . Dental Pain    HPI Jose Little is a 46 y.o. male.  Patient presents to the emergency department for evaluation of dental pain. Patient complaining of pain on the left upper side of his mouth and into the left side of his face. Symptoms began yesterday that worsened tonight. Patient reports constant pain with swelling of the face. He has not had any drainage in his mouth.      Past Medical History:  Diagnosis Date  . Brain tumor ([place])   . Rotator cuff tear     Patient Active Problem List   Diagnosis Date Noted  . KNEE, ARTHRITIS, 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  . KNEE PAIN 09/05/2008    Past Surgical History:  Procedure Laterality Date  . ABDOMINAL SURGERY    . BRAIN SURGERY    . CHOLECYSTECTOMY    . ROTATOR CUFF REPAIR    . SINUS SURGERY WITH INSTATRAK         Home Medications    Prior to Admission medications   Medication Sig Start Date End Date Taking? Authorizing Provider  albuterol (PROVENTIL HFA;VENTOLIN HFA) 108 (90 Base) MCG/ACT inhaler Inhale 1-2 puffs into the lungs every 6 (six) hours as needed for wheezing or shortness of breath. 05/20/16   Triplett, Tammy, PA-C  amoxicillin-clavulanate (AUGMENTIN) 875-125 MG tablet Take 1 tablet by mouth 2 (two) times daily. 06/07/17   Orpah Greek, MD  benzonatate (TESSALON) 200 MG capsule Take 1 capsule (200 mg total) by mouth 3 (three) times daily as needed for cough. Swallow whole, do not chew 05/20/16   Triplett, Tammy, PA-C  HYDROcodone-acetaminophen (NORCO/VICODIN) 5-325 MG tablet Take 1-2 tablets by mouth every 4 (four) hours as needed. 06/07/17   Orpah Greek, MD  ibuprofen (ADVIL,MOTRIN) 800 MG tablet Take 1 tablet (800 mg total) by mouth 3 (three) times daily. 06/07/17   Orpah Greek, MD  predniSONE (DELTASONE) 50 MG tablet One  tablet PO daily for 4 days Patient not taking: Reported on 05/20/2016 05/18/16   Ripley Fraise, MD  traMADol (ULTRAM) 50 MG tablet Take 1 tablet (50 mg total) by mouth every 6 (six) hours as needed. 06/07/17   Orpah Greek, MD    Family History Family History  Problem Relation Age of Onset  . Cancer Father   . Heart failure Other     Social History Social History  Substance Use Topics  . Smoking status: Current Every Day Smoker    Packs/day: 0.50    Years: 12.00    Types: Cigarettes  . Smokeless tobacco: Never Used  . Alcohol use No     Allergies   Patient has no known allergies.   Review of Systems Review of Systems  HENT: Positive for dental problem.   All other systems reviewed and are negative.    Physical Exam Updated Vital Signs BP (!) 168/106 (BP Location: Right Arm)   Pulse 83   Temp 97.6 F (36.4 C) (Oral)   Resp 20   Ht 5\' 9"  (1.753 m)   Wt 99.8 kg (220 lb)   SpO2 98%   BMI 32.49 kg/m   Physical Exam  Constitutional: He is oriented to person, place, and time. He appears well-developed and well-nourished. No distress.  HENT:  Head: Normocephalic and atraumatic.  Right Ear: Hearing normal.  Left  Ear: Hearing normal.  Nose: Nose normal.  Mouth/Throat: Oropharynx is clear and moist and mucous membranes are normal. Abnormal dentition. Dental caries present.  Left maxillary tenderness and swelling  Eyes: Pupils are equal, round, and reactive to light. Conjunctivae and EOM are normal.  Neck: Normal range of motion. Neck supple.  Cardiovascular: Regular rhythm, S1 normal and S2 normal.  Exam reveals no gallop and no friction rub.   No murmur heard. Pulmonary/Chest: Effort normal and breath sounds normal. No respiratory distress. He exhibits no tenderness.  Abdominal: Soft. Normal appearance and bowel sounds are normal. There is no hepatosplenomegaly. There is no tenderness. There is no rebound, no guarding, no tenderness at McBurney's point and  negative Murphy's sign. No hernia.  Musculoskeletal: Normal range of motion.  Neurological: He is alert and oriented to person, place, and time. He has normal strength. No cranial nerve deficit or sensory deficit. Coordination normal. GCS eye subscore is 4. GCS verbal subscore is 5. GCS motor subscore is 6.  Skin: Skin is warm, dry and intact. No rash noted. No cyanosis.  Psychiatric: He has a normal mood and affect. His speech is normal and behavior is normal. Thought content normal.  Nursing note and vitals reviewed.    ED Treatments / Results  Labs (all labs ordered are listed, but only abnormal results are displayed) Labs Reviewed - No data to display  EKG  EKG Interpretation None       Radiology No results found.  Procedures Procedures (including critical care time)  Medications Ordered in ED Medications  amoxicillin-clavulanate (AUGMENTIN) 875-125 MG per tablet 1 tablet (not administered)     Initial Impression / Assessment and Plan / ED Course  I have reviewed the triage vital signs and the nursing notes.  Pertinent labs & imaging results that were available during my care of the patient were reviewed by me and considered in my medical decision making (see chart for details).     Patient presents with dental pain and left maxillary swelling consistent with early dental abscess. Will initiate on antibiotics, analgesia and patient already has follow-up scheduled with dentist.  Final Clinical Impressions(s) / ED Diagnoses   Final diagnoses:  Dental abscess    New Prescriptions New Prescriptions   AMOXICILLIN-CLAVULANATE (AUGMENTIN) 875-125 MG TABLET    Take 1 tablet by mouth 2 (two) times daily.   HYDROCODONE-ACETAMINOPHEN (NORCO/VICODIN) 5-325 MG TABLET    Take 1-2 tablets by mouth every 4 (four) hours as needed.   IBUPROFEN (ADVIL,MOTRIN) 800 MG TABLET    Take 1 tablet (800 mg total) by mouth 3 (three) times daily.   TRAMADOL (ULTRAM) 50 MG TABLET    Take 1  tablet (50 mg total) by mouth every 6 (six) hours as needed.     Orpah Greek, MD 06/07/17 [phone]

## 2018-02-13 ENCOUNTER — Encounter (HOSPITAL_COMMUNITY): Payer: Self-pay | Admitting: Emergency Medicine

## 2018-02-13 ENCOUNTER — Emergency Department (HOSPITAL_COMMUNITY)
Admission: EM | Admit: 2018-02-13 | Discharge: 2018-02-13 | Disposition: A | Discharge status: Home / Self Care | Payer: BLUE CROSS/BLUE SHIELD | Attending: Emergency Medicine | Admitting: Emergency Medicine

## 2018-02-13 DIAGNOSIS — K0889 Other specified disorders of teeth and supporting structures: Secondary | ICD-10-CM | POA: Diagnosis present

## 2018-02-13 DIAGNOSIS — F17210 Nicotine dependence, cigarettes, uncomplicated: Secondary | ICD-10-CM | POA: Diagnosis not present

## 2018-02-13 DIAGNOSIS — Z79899 Other long term (current) drug therapy: Secondary | ICD-10-CM | POA: Diagnosis not present

## 2018-02-13 DIAGNOSIS — K047 Periapical abscess without sinus: Secondary | ICD-10-CM | POA: Diagnosis not present

## 2018-02-13 DIAGNOSIS — K029 Dental caries, unspecified: Secondary | ICD-10-CM

## 2018-02-13 MED ORDER — IBUPROFEN 600 MG PO TABS: 600.0000 mg | ORAL_TABLET | Freq: Four times a day (QID) | ORAL | 0 refills | Status: DC

## 2018-02-13 MED ORDER — AMOXICILLIN-POT CLAVULANATE 875-125 MG PO TABS
1.0000 | ORAL_TABLET | Freq: Once | ORAL | Status: AC
  Administered 2018-02-13: 1 via ORAL
  Filled 2018-02-13: qty 1

## 2018-02-13 MED ORDER — AMOXICILLIN 875 MG PO TABS: 875.0000 mg | ORAL_TABLET | Freq: Two times a day (BID) | ORAL | 0 refills | Status: DC

## 2018-02-13 MED ORDER — IBUPROFEN 800 MG PO TABS
800.0000 mg | ORAL_TABLET | Freq: Once | ORAL | Status: AC
  Administered 2018-02-13: 800 mg via ORAL
  Filled 2018-02-13: qty 1

## 2018-02-13 MED ORDER — TRAMADOL HCL 50 MG PO TABS
100.0000 mg | ORAL_TABLET | Freq: Once | ORAL | Status: AC
  Administered 2018-02-13: 100 mg via ORAL
  Filled 2018-02-13: qty 2

## 2018-02-13 MED ORDER — ONDANSETRON HCL 4 MG PO TABS
4.0000 mg | ORAL_TABLET | Freq: Once | ORAL | Status: AC
  Administered 2018-02-13: 4 mg via ORAL
  Filled 2018-02-13: qty 1

## 2018-02-13 NOTE — Discharge Instructions (Addendum)
You have multiple dental cavities, with evidence of infection on the left greater than on the right side.  It is extremely important that she see a dentist as soon as possible.  Please use Amoxil 2 times daily with food.  Use ibuprofen with breakfast, lunch, dinner, and at bedtime.  May use Tylenol extra strength in between the ibuprofen doses.  Please see Dr. Nevada Crane for additional evaluation and assistance with pain if needed before you see the dentist.

## 2018-02-13 NOTE — ED Triage Notes (Signed)
Years if poor dental care   Here for dental pain

## 2018-02-13 NOTE — ED Provider Notes (Signed)
[facility] EMERGENCY DEPARTMENT Provider Note   CSN: [id number] Arrival date & time: 02/13/18  1807     History   Chief Complaint Chief Complaint  Patient presents with  . Dental Pain    HPI Jose Little is a 47 y.o. male.  The history is provided by the patient.  Dental Pain   Chronicity: acute on chronic. The current episode started more than 2 days ago. The problem occurs constantly. The problem has been gradually worsening. The pain is moderate. Treatments tried: OTC pain medications. The treatment provided no relief.    Past Medical History:  Diagnosis Date  . Brain tumor ([place])   . Rotator cuff tear     Patient Active Problem List   Diagnosis Date Noted  . KNEE, ARTHRITIS, 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  . KNEE PAIN 09/05/2008    Past Surgical History:  Procedure Laterality Date  . ABDOMINAL SURGERY    . BRAIN SURGERY    . CHOLECYSTECTOMY    . ROTATOR CUFF REPAIR    . SINUS SURGERY WITH INSTATRAK          Home Medications    Prior to Admission medications   Medication Sig Start Date End Date Taking? Authorizing Provider  albuterol (PROVENTIL HFA;VENTOLIN HFA) 108 (90 Base) MCG/ACT inhaler Inhale 1-2 puffs into the lungs every 6 (six) hours as needed for wheezing or shortness of breath. 05/20/16   Triplett, Tammy, PA-C  amoxicillin-clavulanate (AUGMENTIN) 875-125 MG tablet Take 1 tablet by mouth 2 (two) times daily. 06/07/17   Orpah Greek, MD  benzonatate (TESSALON) 200 MG capsule Take 1 capsule (200 mg total) by mouth 3 (three) times daily as needed for cough. Swallow whole, do not chew 05/20/16   Triplett, Tammy, PA-C  HYDROcodone-acetaminophen (NORCO/VICODIN) 5-325 MG tablet Take 1-2 tablets by mouth every 4 (four) hours as needed. 06/07/17   Orpah Greek, MD  ibuprofen (ADVIL,MOTRIN) 800 MG tablet Take 1 tablet (800 mg total) by mouth 3 (three) times daily. 06/07/17   Orpah Greek, MD  predniSONE (DELTASONE) 50 MG tablet One tablet  PO daily for 4 days Patient not taking: Reported on 05/20/2016 05/18/16   Ripley Fraise, MD  traMADol (ULTRAM) 50 MG tablet Take 1 tablet (50 mg total) by mouth every 6 (six) hours as needed. 06/07/17   Orpah Greek, MD    Family History Family History  Problem Relation Age of Onset  . Cancer Father   . Heart failure Other     Social History Social History   Tobacco Use  . Smoking status: Current Every Day Smoker    Packs/day: 0.50    Years: 12.00    Pack years: 6.00    Types: Cigarettes  . Smokeless tobacco: Never Used  Substance Use Topics  . Alcohol use: No  . Drug use: No     Allergies   Patient has no known allergies.   Review of Systems Review of Systems  Constitutional: Negative for activity change.       All ROS Neg except as noted in HPI  HENT: Positive for dental problem and facial swelling. Negative for nosebleeds.   Eyes: Negative for photophobia and discharge.  Respiratory: Negative for cough, shortness of breath and wheezing.   Cardiovascular: Negative for chest pain and palpitations.  Gastrointestinal: Negative for abdominal pain and blood in stool.  Genitourinary: Negative for dysuria, frequency and hematuria.  Musculoskeletal: Negative for arthralgias, back pain and neck pain.  Skin: Negative.   Neurological: Negative  for dizziness, seizures and speech difficulty.  Psychiatric/Behavioral: Negative for confusion and hallucinations.     Physical Exam Updated Vital Signs BP (!) 144/93 (BP Location: Right Arm)   Pulse 87   Temp 98.9 F (37.2 C) (Oral)   Resp 18   Ht 5\' 9"  (1.753 m)   Wt 90.7 kg (200 lb)   SpO2 97%   BMI 29.53 kg/m   Physical Exam  Constitutional: He is oriented to person, place, and time. He appears well-developed and well-nourished.  Non-toxic appearance.  HENT:  Head: Normocephalic. Head is without Battle's sign, without contusion, without right periorbital erythema and without left periorbital erythema.     Right Ear: Tympanic membrane and external ear normal.  Left Ear: Tympanic membrane and external ear normal.  Mouth/Throat: Dental caries present. No uvula swelling.  Multiple dental cavities. Swelling at the left upper pre-molar area. No visible abscess. No swelling under the tongue. Airway patent.  Eyes: Pupils are equal, round, and reactive to light. EOM and lids are normal.  Neck: Normal range of motion. Neck supple. Carotid bruit is not present.  Cardiovascular: Normal rate, regular rhythm, normal heart sounds, intact distal pulses and normal pulses. Exam reveals no gallop and no friction rub.  No murmur heard. Pulmonary/Chest: Breath sounds normal. No respiratory distress.  Abdominal: Soft. Bowel sounds are normal. There is no tenderness. There is no guarding.  Musculoskeletal: Normal range of motion.  Lymphadenopathy:       Head (right side): No submandibular adenopathy present.       Head (left side): No submandibular adenopathy present.    He has no cervical adenopathy.  Neurological: He is alert and oriented to person, place, and time. He has normal strength. No cranial nerve deficit or sensory deficit.  Skin: Skin is warm and dry.  Psychiatric: He has a normal mood and affect. His speech is normal.  Nursing note and vitals reviewed.    ED Treatments / Results  Labs (all labs ordered are listed, but only abnormal results are displayed) Labs Reviewed - No data to display  EKG None  Radiology No results found.  Procedures Procedures (including critical care time)  Medications Ordered in ED Medications - No data to display   Initial Impression / Assessment and Plan / ED Course  I have reviewed the triage vital signs and the nursing notes.  Pertinent labs & imaging results that were available during my care of the patient were reviewed by me and considered in my medical decision making (see chart for details).       Final Clinical Impressions(s) / ED  Diagnoses  MDM  Blood pressure is elevated at 144/93, otherwise vital signs within normal limits.  Patient has multiple dental caries of the upper and lower jaw area.  There is swelling of the left upper gum, and pain of the left face.  No visible abscess appreciated.  No significant facial swelling noted.  There is no evidence of Ludwig's angina or any other emergent situation at this time.  The patient will be treated with Amoxil and ibuprofen.  I have asked the patient to see Dr. Blanca Friend for additional assistance with pain management.  I strongly encouraged patient to see a dentist as soon as possible.  We discussed the possible serious infections that could result from this level of dental caries.  Patient acknowledged understanding of the instructions.   Final diagnoses:  Dental caries  Dental infection    ED Discharge Orders  Ordered    amoxicillin (AMOXIL) 875 MG tablet  2 times daily     02/13/18 1922    ibuprofen (ADVIL,MOTRIN) 600 MG tablet  4 times daily     02/13/18 1922       Lily Kocher, [place] 02/13/18 1939    Fredia Sorrow, MD 02/14/18 [phone]

## 2018-02-14 ENCOUNTER — Emergency Department (HOSPITAL_COMMUNITY): Payer: BLUE CROSS/BLUE SHIELD

## 2018-02-14 ENCOUNTER — Other Ambulatory Visit: Payer: Self-pay

## 2018-02-14 ENCOUNTER — Encounter (HOSPITAL_COMMUNITY): Payer: Self-pay | Admitting: Emergency Medicine

## 2018-02-14 ENCOUNTER — Emergency Department (HOSPITAL_COMMUNITY)
Admission: EM | Admit: 2018-02-14 | Discharge: 2018-02-14 | Disposition: A | Discharge status: Home / Self Care | Payer: BLUE CROSS/BLUE SHIELD | Attending: Emergency Medicine | Admitting: Emergency Medicine

## 2018-02-14 DIAGNOSIS — L03211 Cellulitis of face: Secondary | ICD-10-CM | POA: Insufficient documentation

## 2018-02-14 DIAGNOSIS — K0889 Other specified disorders of teeth and supporting structures: Secondary | ICD-10-CM | POA: Diagnosis present

## 2018-02-14 DIAGNOSIS — Z79899 Other long term (current) drug therapy: Secondary | ICD-10-CM | POA: Diagnosis not present

## 2018-02-14 DIAGNOSIS — F17210 Nicotine dependence, cigarettes, uncomplicated: Secondary | ICD-10-CM | POA: Diagnosis not present

## 2018-02-14 LAB — CBC WITH DIFFERENTIAL/PLATELET
Basophils Absolute: 0 10*3/uL (ref 0.0–0.1)
Basophils Relative: 0 %
EOS ABS: 0 10*3/uL (ref 0.0–0.7)
EOS PCT: 0 %
HCT: 47.3 % (ref 39.0–52.0)
HEMOGLOBIN: 15.7 g/dL (ref 13.0–17.0)
LYMPHS ABS: 0.6 10*3/uL — AB (ref 0.7–4.0)
Lymphocytes Relative: 9 %
MCH: 29.7 pg (ref 26.0–34.0)
MCHC: 33.2 g/dL (ref 30.0–36.0)
MCV: 89.6 fL (ref 78.0–100.0)
MONO ABS: 0.5 10*3/uL (ref 0.1–1.0)
MONOS PCT: 7 %
Neutro Abs: 5.9 10*3/uL (ref 1.7–7.7)
Neutrophils Relative %: 84 %
PLATELETS: 173 10*3/uL (ref 150–400)
RBC: 5.28 MIL/uL (ref 4.22–5.81)
RDW: 12.9 % (ref 11.5–15.5)
WBC: 7.1 10*3/uL (ref 4.0–10.5)

## 2018-02-14 LAB — BASIC METABOLIC PANEL
Anion gap: 7 (ref 5–15)
BUN: 18 mg/dL (ref 6–20)
CHLORIDE: 105 mmol/L (ref 101–111)
CO2: 27 mmol/L (ref 22–32)
CREATININE: 1.18 mg/dL (ref 0.61–1.24)
Calcium: 8.8 mg/dL — ABNORMAL LOW (ref 8.9–10.3)
GFR calc Af Amer: >60 mL/min (ref >60)
GFR calc non Af Amer: >60 mL/min (ref >60)
Glucose, Bld: 103 mg/dL — ABNORMAL HIGH (ref 65–99)
Potassium: 4.1 mmol/L (ref 3.5–5.1)
Sodium: 139 mmol/L (ref 135–145)

## 2018-02-14 MED ORDER — ONDANSETRON HCL 4 MG/2ML IJ SOLN
4.0000 mg | Freq: Once | INTRAMUSCULAR | Status: AC
  Administered 2018-02-14: 4 mg via INTRAVENOUS
  Filled 2018-02-14: qty 2

## 2018-02-14 MED ORDER — CLINDAMYCIN PHOSPHATE 300 MG/50ML IV SOLN
300.0000 mg | Freq: Once | INTRAVENOUS | Status: AC
  Administered 2018-02-14: 300 mg via INTRAVENOUS
  Filled 2018-02-14: qty 50

## 2018-02-14 MED ORDER — IOPAMIDOL (ISOVUE-300) INJECTION 61%
100.0000 mL | Freq: Once | INTRAVENOUS | Status: AC | PRN
  Administered 2018-02-14: 75 mL via INTRAVENOUS

## 2018-02-14 MED ORDER — SODIUM CHLORIDE 0.9 % IV BOLUS
1000.0000 mL | Freq: Once | INTRAVENOUS | Status: AC
  Administered 2018-02-14: 1000 mL via INTRAVENOUS

## 2018-02-14 MED ORDER — OXYCODONE-ACETAMINOPHEN 5-325 MG PO TABS: 1.0000 | ORAL_TABLET | ORAL | 0 refills | Status: DC | PRN

## 2018-02-14 MED ORDER — CLINDAMYCIN HCL 150 MG PO CAPS: 150.0000 mg | ORAL_CAPSULE | Freq: Four times a day (QID) | ORAL | 0 refills | Status: DC

## 2018-02-14 MED ORDER — MORPHINE SULFATE (PF) 4 MG/ML IV SOLN
4.0000 mg | Freq: Once | INTRAVENOUS | Status: AC
  Administered 2018-02-14: 4 mg via INTRAVENOUS
  Filled 2018-02-14: qty 1

## 2018-02-14 MED ORDER — DEXAMETHASONE SODIUM PHOSPHATE 4 MG/ML IJ SOLN
8.0000 mg | Freq: Once | INTRAMUSCULAR | Status: AC
  Administered 2018-02-14: 8 mg via INTRAVENOUS
  Filled 2018-02-14: qty 2

## 2018-02-14 NOTE — ED Triage Notes (Signed)
Pt was seen last night for upper dental pain and given prescriptions but was not able to get them filled.  Pt c/o of same upper dental pain with facial swelling that is new

## 2018-02-14 NOTE — ED Notes (Signed)
Patient transported to CT 

## 2018-02-14 NOTE — Discharge Instructions (Addendum)
CT scan shows no abscess.  Prescription for antibiotic and pain medicine.  You will need to see the dental specialist.  Phone number given.

## 2018-02-14 NOTE — ED Provider Notes (Signed)
[facility] EMERGENCY DEPARTMENT Provider Note   CSN: [id number] Arrival date & time: 02/14/18  5573     History   Chief Complaint Chief Complaint  Patient presents with  . Dental Pain  . Facial Swelling    HPI Jose Little is a 47 y.o. male.  Patient presents with left upper dental pain for several days.  He was prescribed amoxicillin but has not had time to fill the prescription.  He now has swelling in his left cheek.  No fever, sweats, chills, stiff neck.  Severity is moderate.  Palpation makes pain worse     Past Medical History:  Diagnosis Date  . Brain tumor ([place])   . Rotator cuff tear     Patient Active Problem List   Diagnosis Date Noted  . KNEE, ARTHRITIS, 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  . KNEE PAIN 09/05/2008    Past Surgical History:  Procedure Laterality Date  . ABDOMINAL SURGERY    . BRAIN SURGERY    . CHOLECYSTECTOMY    . ROTATOR CUFF REPAIR    . SINUS SURGERY WITH INSTATRAK          Home Medications    Prior to Admission medications   Medication Sig Start Date End Date Taking? Authorizing Provider  acetaminophen (TYLENOL) 500 MG tablet Take 1,000 mg by mouth every 6 (six) hours as needed.   Yes [provider]  amoxicillin (AMOXIL) 875 MG tablet Take 1 tablet (875 mg total) by mouth 2 (two) times daily. 02/13/18   Lily Kocher, PA-C  clindamycin (CLEOCIN) 150 MG capsule Take 1 capsule (150 mg total) by mouth every 6 (six) hours. 02/14/18   Nat Christen, MD  ibuprofen (ADVIL,MOTRIN) 600 MG tablet Take 1 tablet (600 mg total) by mouth 4 (four) times daily. 02/13/18   Lily Kocher, PA-C  oxyCODONE-acetaminophen (PERCOCET) 5-325 MG tablet Take 1 tablet by mouth every 4 (four) hours as needed. 02/14/18   Nat Christen, MD    Family History Family History  Problem Relation Age of Onset  . Cancer Father   . Heart failure Other     Social History Social History   Tobacco Use  . Smoking status: Current Every Day Smoker    Packs/day: 0.50   Years: 12.00    Pack years: 6.00    Types: Cigarettes  . Smokeless tobacco: Never Used  Substance Use Topics  . Alcohol use: No  . Drug use: No     Allergies   Patient has no known allergies.   Review of Systems Review of Systems  All other systems reviewed and are negative.    Physical Exam Updated Vital Signs BP 115/64   Pulse 65   Temp 99.6 F (37.6 C) (Oral)   Resp 16   Ht 5\' 9"  (1.753 m)   Wt 90.7 kg (200 lb)   SpO2 93%   BMI 29.53 kg/m   Physical Exam  Constitutional: He is oriented to person, place, and time. He appears well-developed and well-nourished.  HENT:  Head: Normocephalic and atraumatic.  Extensive caries.  Tender in the left upper maxilla.  Minimal left cheek tenderness and swelling.  Eyes: Conjunctivae are normal.  Neck: Neck supple.  Cardiovascular: Normal rate and regular rhythm.  Pulmonary/Chest: Effort normal and breath sounds normal.  Abdominal: Soft. Bowel sounds are normal.  Musculoskeletal: Normal range of motion.  Neurological: He is alert and oriented to person, place, and time.  Skin: Skin is warm and dry.  Psychiatric: He has a normal mood  and affect. His behavior is normal.  Nursing note and vitals reviewed.    ED Treatments / Results  Labs (all labs ordered are listed, but only abnormal results are displayed) Labs Reviewed  CBC WITH DIFFERENTIAL/PLATELET - Abnormal; Notable for the following components:      Result Value   Lymphs Abs 0.6 (*)    All other components within normal limits  BASIC METABOLIC PANEL - Abnormal; Notable for the following components:   Glucose, Bld 103 (*)    Calcium 8.8 (*)    All other components within normal limits    EKG None  Radiology Ct Maxillofacial W Contrast  Result Date: 02/14/2018 CLINICAL DATA:  Dental pain.  Facial swelling left side EXAM: CT MAXILLOFACIAL WITH CONTRAST TECHNIQUE: Multidetector CT imaging of the maxillofacial structures was performed with intravenous contrast.  Multiplanar CT image reconstructions were also generated. CONTRAST:  23mL ISOVUE-300 IOPAMIDOL (ISOVUE-300) INJECTION 61% COMPARISON:  CT face 01/19/2010 FINDINGS: Osseous: Extensive caries and destruction of right upper pre molar with periapical lucency and erosion of the lateral cortex of the maxilla. No associated abscess. Caries and periapical lucency around left upper lateral incisor with associated soft tissue swelling. No soft tissue abscess. Multiple additional caries. Orbits: Normal orbital soft tissues. No mass or abscess in the orbit Sinuses: Mild mucosal edema in the maxillary and ethmoid sinuses bilaterally. No air-fluid level. Nasal passageway patent Soft tissues: Mild soft tissue swelling anterior to left maxilla compatible with dental infection and cellulitis. No soft tissue abscess or mass lesion Limited intracranial: Negative IMPRESSION: Extensive dental caries. Mild cellulitis anterior to the left maxilla due to dental infection. No soft tissue abscess. Electronically Signed   By: Franchot Gallo M.D.   On: 02/14/2018 10:31    Procedures Procedures (including critical care time)  Medications Ordered in ED Medications  clindamycin (CLEOCIN) IVPB 300 mg (300 mg Intravenous New Bag/Given 02/14/18 1209)  sodium chloride 0.9 % bolus 1,000 mL (0 mLs Intravenous Stopped 02/14/18 1134)  ondansetron (ZOFRAN) injection 4 mg (4 mg Intravenous Given 02/14/18 0925)  morphine 4 MG/ML injection 4 mg (4 mg Intravenous Given 02/14/18 0925)  dexamethasone (DECADRON) injection 8 mg (8 mg Intravenous Given 02/14/18 0925)  iopamidol (ISOVUE-300) 61 % injection 100 mL (75 mLs Intravenous Contrast Given 02/14/18 1000)     Initial Impression / Assessment and Plan / ED Course  I have reviewed the triage vital signs and the nursing notes.  Pertinent labs & imaging results that were available during my care of the patient were reviewed by me and considered in my medical decision making (see chart for details).      Patient presents with worsening tooth pain and cheek swelling.  CT maxillofacial with contrast reveals cellulitis but no abscess.  IV clindamycin, IV steroids, IV pain meds.  Discharge medication Cleocin 150 mg and Percocet.  Patient will follow-up with oral surgeon.  Phone number given.  Final Clinical Impressions(s) / ED Diagnoses   Final diagnoses:  Facial cellulitis  Pain, dental    ED Discharge Orders        Ordered    clindamycin (CLEOCIN) 150 MG capsule  Every 6 hours     02/14/18 1152    oxyCODONE-acetaminophen (PERCOCET) 5-325 MG tablet  Every 4 hours PRN     02/14/18 1152       Nat Christen, MD 02/14/18 1233

## 2018-11-25 ENCOUNTER — Other Ambulatory Visit: Payer: Self-pay

## 2018-11-25 ENCOUNTER — Emergency Department (HOSPITAL_COMMUNITY): Payer: BLUE CROSS/BLUE SHIELD

## 2018-11-25 ENCOUNTER — Encounter (HOSPITAL_COMMUNITY): Payer: Self-pay | Admitting: Emergency Medicine

## 2018-11-25 ENCOUNTER — Emergency Department (HOSPITAL_COMMUNITY)
Admission: EM | Admit: 2018-11-25 | Discharge: 2018-11-25 | Disposition: A | Discharge status: Home / Self Care | Payer: BLUE CROSS/BLUE SHIELD | Attending: Emergency Medicine | Admitting: Emergency Medicine

## 2018-11-25 DIAGNOSIS — H81391 Other peripheral vertigo, right ear: Secondary | ICD-10-CM | POA: Diagnosis not present

## 2018-11-25 DIAGNOSIS — R509 Fever, unspecified: Secondary | ICD-10-CM | POA: Insufficient documentation

## 2018-11-25 DIAGNOSIS — R110 Nausea: Secondary | ICD-10-CM | POA: Diagnosis not present

## 2018-11-25 DIAGNOSIS — F17210 Nicotine dependence, cigarettes, uncomplicated: Secondary | ICD-10-CM | POA: Insufficient documentation

## 2018-11-25 DIAGNOSIS — R42 Dizziness and giddiness: Secondary | ICD-10-CM | POA: Diagnosis present

## 2018-11-25 DIAGNOSIS — R220 Localized swelling, mass and lump, head: Secondary | ICD-10-CM | POA: Diagnosis not present

## 2018-11-25 DIAGNOSIS — J0100 Acute maxillary sinusitis, unspecified: Secondary | ICD-10-CM | POA: Diagnosis not present

## 2018-11-25 LAB — COMPREHENSIVE METABOLIC PANEL
ALK PHOS: 59 U/L (ref 38–126)
ALT: 24 U/L (ref 0–44)
ANION GAP: 8 (ref 5–15)
AST: 24 U/L (ref 15–41)
Albumin: 3.6 g/dL (ref 3.5–5.0)
BILIRUBIN TOTAL: 0.5 mg/dL (ref 0.3–1.2)
BUN: 19 mg/dL (ref 6–20)
CALCIUM: 8 mg/dL — AB (ref 8.9–10.3)
CO2: 24 mmol/L (ref 22–32)
Chloride: 106 mmol/L (ref 98–111)
Creatinine, Ser: 0.9 mg/dL (ref 0.61–1.24)
Glucose, Bld: 111 mg/dL — ABNORMAL HIGH (ref 70–99)
Potassium: 3.9 mmol/L (ref 3.5–5.1)
SODIUM: 138 mmol/L (ref 135–145)
TOTAL PROTEIN: 6.2 g/dL — AB (ref 6.5–8.1)

## 2018-11-25 LAB — CBC WITH DIFFERENTIAL/PLATELET
Abs Immature Granulocytes: 0.01 10*3/uL (ref 0.00–0.07)
Basophils Absolute: 0 10*3/uL (ref 0.0–0.1)
Basophils Relative: 1 %
EOS PCT: 3 %
Eosinophils Absolute: 0.1 10*3/uL (ref 0.0–0.5)
HCT: 44 % (ref 39.0–52.0)
HEMOGLOBIN: 14.4 g/dL (ref 13.0–17.0)
Immature Granulocytes: 0 %
Lymphocytes Relative: 25 %
Lymphs Abs: 1 10*3/uL (ref 0.7–4.0)
MCH: 28.9 pg (ref 26.0–34.0)
MCHC: 32.7 g/dL (ref 30.0–36.0)
MCV: 88.2 fL (ref 80.0–100.0)
MONO ABS: 0.5 10*3/uL (ref 0.1–1.0)
MONOS PCT: 14 %
Neutro Abs: 2.3 10*3/uL (ref 1.7–7.7)
Neutrophils Relative %: 57 %
Platelets: 182 10*3/uL (ref 150–400)
RBC: 4.99 MIL/uL (ref 4.22–5.81)
RDW: 13.2 % (ref 11.5–15.5)
WBC: 4 10*3/uL (ref 4.0–10.5)
nRBC: 0 % (ref 0.0–0.2)

## 2018-11-25 LAB — INFLUENZA PANEL BY PCR (TYPE A & B)
INFLAPCR: NEGATIVE
INFLBPCR: NEGATIVE

## 2018-11-25 MED ORDER — MECLIZINE HCL 25 MG PO TABS: 25.0000 mg | ORAL_TABLET | Freq: Three times a day (TID) | ORAL | 0 refills | Status: DC | PRN

## 2018-11-25 MED ORDER — FLUTICASONE PROPIONATE 50 MCG/ACT NA SUSP: 2.0000 | Freq: Every day | NASAL | 0 refills | Status: DC

## 2018-11-25 MED ORDER — LORATADINE 10 MG PO TABS: 10.0000 mg | ORAL_TABLET | Freq: Every day | ORAL | 0 refills | Status: DC

## 2018-11-25 MED ORDER — AMOXICILLIN-POT CLAVULANATE 875-125 MG PO TABS: 1.0000 | ORAL_TABLET | Freq: Two times a day (BID) | ORAL | 0 refills | Status: DC

## 2018-11-25 NOTE — ED Triage Notes (Signed)
Dizziness and fever of 103 yesterday.  Took tylenol at 12pm

## 2018-11-25 NOTE — ED Provider Notes (Signed)
[facility] EMERGENCY DEPARTMENT Provider Note   CSN: [id number] Arrival date & time: 11/25/18  1516     History   Chief Complaint Chief Complaint  Patient presents with  . Dizziness    HPI Jose Little is a 48 y.o. male.  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  Patient has not followed up.  States he is developed feeling off balance especially with standing and turning his head which started yesterday.  This is associated with nausea.  Denies any focal weakness.  Patient also has been having fever with questionable left lower face swelling.  Has known poor dentition but denies any intraoral abscesses.  Has had nonproductive cough.  He denies any abdominal pain, vomiting or diarrhea.  No new rashes.  No neck pain or stiffness.  No focal weakness or numbness.   Past Medical History:  Diagnosis Date  . Brain tumor ([place])   . Rotator cuff tear     Patient Active Problem List   Diagnosis Date Noted  . KNEE, ARTHRITIS, 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  . KNEE PAIN 09/05/2008    Past Surgical History:  Procedure Laterality Date  . ABDOMINAL SURGERY    . BRAIN SURGERY    . CHOLECYSTECTOMY    . ROTATOR CUFF REPAIR    . SINUS SURGERY WITH INSTATRAK          Home Medications    Prior to Admission medications   Medication Sig Start Date End Date Taking? Authorizing Provider  acetaminophen (TYLENOL) 500 MG tablet Take 1,000 mg by mouth every 6 (six) hours as needed.   Yes [provider]  amoxicillin (AMOXIL) 875 MG tablet Take 1 tablet (875 mg total) by mouth 2 (two) times daily. 02/13/18   Lily Kocher, PA-C  amoxicillin-clavulanate (AUGMENTIN) 875-125 MG tablet Take 1 tablet by mouth 2 (two) times daily. One po bid x 7 days 11/25/18   Julianne Rice, MD  clindamycin (CLEOCIN) 150 MG capsule Take 1 capsule (150 mg total) by mouth every 6 (six) hours. 02/14/18   Nat Christen, MD  fluticasone [facility]) 50 MCG/ACT nasal  spray Place 2 sprays into both nostrils daily. 11/25/18   Julianne Rice, MD  ibuprofen (ADVIL,MOTRIN) 600 MG tablet Take 1 tablet (600 mg total) by mouth 4 (four) times daily. 02/13/18   Lily Kocher, PA-C  loratadine (CLARITIN) 10 MG tablet Take 1 tablet (10 mg total) by mouth daily. 11/25/18   Julianne Rice, MD  meclizine (ANTIVERT) 25 MG tablet Take 1 tablet (25 mg total) by mouth 3 (three) times daily as needed for dizziness. 11/25/18   Julianne Rice, MD  oxyCODONE-acetaminophen (PERCOCET) 5-325 MG tablet Take 1 tablet by mouth every 4 (four) hours as needed. 02/14/18   Nat Christen, MD    Family History Family History  Problem Relation Age of Onset  . Cancer Father   . Heart failure Other     Social History Social History   Tobacco Use  . Smoking status: Current Every Day Smoker    Packs/day: 0.50    Years: 12.00    Pack years: 6.00    Types: Cigarettes  . Smokeless tobacco: Never Used  Substance Use Topics  . Alcohol use: No  . Drug use: No     Allergies   Patient has no known allergies.   Review of Systems Review of Systems  Constitutional: Positive for chills, fatigue and fever.  HENT: Positive for facial swelling and sinus pressure.  Negative for ear discharge, ear pain, sore throat, tinnitus and trouble swallowing.   Eyes: Negative for visual disturbance.  Respiratory: Positive for cough. Negative for shortness of breath.   Cardiovascular: Negative for chest pain, palpitations and leg swelling.  Gastrointestinal: Positive for nausea. Negative for abdominal pain, constipation, diarrhea and vomiting.  Genitourinary: Negative for dysuria, frequency and hematuria.  Musculoskeletal: Positive for back pain and myalgias. Negative for arthralgias, neck pain and neck stiffness.  Skin: Negative for rash and wound.  Neurological: Positive for dizziness. Negative for speech difficulty, weakness, light-headedness, numbness and headaches.  All other systems reviewed and  are negative.    Physical Exam Updated Vital Signs BP 106/62   Pulse (!) 56   Temp 97.9 F (36.6 C) (Oral)   Resp 15   Ht 5\' 9"  (1.753 m)   Wt 83.9 kg   SpO2 95%   BMI 27.32 kg/m   Physical Exam Vitals signs and nursing note reviewed.  Constitutional:      Appearance: Normal appearance. He is well-developed.  HENT:     Head: Normocephalic and atraumatic.     Comments: Poor dentition throughout.  No obvious gingival swelling or fluctuant masses.  Patient has tenderness to palpation over the left maxillary sinus.  Patient has bulging and sclerotic appearing right TM.  No posterior auricular erythema or tenderness.    Nose: Nose normal.     Mouth/Throat:     Mouth: Mucous membranes are moist.     Pharynx: No oropharyngeal exudate or posterior oropharyngeal erythema.  Eyes:     Extraocular Movements: Extraocular movements intact.     Pupils: Pupils are equal, round, and reactive to light.     Comments: Fatigable horizontal nystagmus when looking to the right  Neck:     Musculoskeletal: Normal range of motion and neck supple. No neck rigidity or muscular tenderness.  Cardiovascular:     Rate and Rhythm: Normal rate and regular rhythm.     Heart sounds: No murmur. No friction rub. No gallop.   Pulmonary:     Effort: Pulmonary effort is normal. No respiratory distress.     Breath sounds: Normal breath sounds. No stridor. No wheezing, rhonchi or rales.  Chest:     Chest wall: No tenderness.  Abdominal:     General: Bowel sounds are normal. There is no distension.     Palpations: Abdomen is soft. There is no mass.     Tenderness: There is no abdominal tenderness. There is no right CVA tenderness, left CVA tenderness, guarding or rebound.     Hernia: No hernia is present.  Musculoskeletal: Normal range of motion.        General: No swelling, tenderness or deformity.     Right lower leg: No edema.     Left lower leg: No edema.     Comments: No midline thoracic or lumbar  tenderness.  No CVA tenderness.  No lower extremity swelling, asymmetry or tenderness.  Lymphadenopathy:     Cervical: No cervical adenopathy.  Skin:    General: Skin is warm and dry.     Findings: No erythema or rash.  Neurological:     General: No focal deficit present.     Mental Status: He is alert and oriented to person, place, and time.     Comments: Patient is alert and oriented x3 with clear, goal oriented speech. Patient has 5/5 motor in all extremities. Sensation is intact to light touch. Bilateral finger-to-nose is normal with no signs  of dysmetria. Patient has a normal gait and walks without assistance.  Psychiatric:        Mood and Affect: Mood normal.        Behavior: Behavior normal.      ED Treatments / Results  Labs (all labs ordered are listed, but only abnormal results are displayed) Labs Reviewed  COMPREHENSIVE METABOLIC PANEL - Abnormal; Notable for the following components:      Result Value   Glucose, Bld 111 (*)    Calcium 8.0 (*)    Total Protein 6.2 (*)    All other components within normal limits  CBC WITH DIFFERENTIAL/PLATELET  INFLUENZA PANEL BY PCR (TYPE A & B)    EKG None  Radiology Dg Chest 2 View  Result Date: 11/25/2018 CLINICAL DATA:  Cough. EXAM: CHEST - 2 VIEW COMPARISON:  05/18/2016, 03/11/2011 and 01/19/2010 and CT scan of the chest dated 03/14/2010 FINDINGS: The heart size and mediastinal contours are within normal limits. Both lungs are clear. The visualized skeletal structures are unremarkable. 7 mm nodular density overlying the left lower lung zone most likely represents a nipple shadow. IMPRESSION: Small nodular density overlying the left lower lung zone. I recommend repeat PA chest with nipple markers to confirm this represents a nipple shadow rather than a pulmonary nodule. Otherwise, normal exam. Electronically Signed   By: Lorriane Shire M.D.   On: 11/25/2018 17:15   Ct Head Wo Contrast  Result Date: 11/25/2018 CLINICAL DATA:   Dizziness with fever yesterday. EXAM: CT HEAD WITHOUT CONTRAST TECHNIQUE: Contiguous axial images were obtained from the base of the skull through the vertex without intravenous contrast. COMPARISON:  CT head 11/22/2014. FINDINGS: Brain: There is no evidence of acute intracranial hemorrhage, mass lesion, brain edema or extra-axial fluid collection. The ventricles and subarachnoid spaces are appropriately sized for age. There is no CT evidence of acute cortical infarction. Vascular:  No hyperdense vessel identified. Skull: Negative for fracture or focal lesion. Sinuses/Orbits: The visualized paranasal sinuses and mastoid air cells are clear. No orbital abnormalities are seen. Other: None. IMPRESSION: Negative noncontrast head CT. Electronically Signed   By: Richardean Sale M.D.   On: 11/25/2018 17:19    Procedures Procedures (including critical care time)  Medications Ordered in ED Medications - No data to display   Initial Impression / Assessment and Plan / ED Course  I have reviewed the triage vital signs and the nursing notes.  Pertinent labs & imaging results that were available during my care of the patient were reviewed by me and considered in my medical decision making (see chart for details).     CT head without acute findings.  Suspect left maxillary sinus disease and possible right otitis media.  Will start on Augmentin.  Patient has normal neurologic exam.  He is well-appearing.  Stable vital signs.  Strict return precautions have been given.  Final Clinical Impressions(s) / ED Diagnoses   Final diagnoses:  Peripheral vertigo involving right ear  Acute maxillary sinusitis, recurrence not specified    ED Discharge Orders         Ordered    amoxicillin-clavulanate (AUGMENTIN) 875-125 MG tablet  2 times daily     11/25/18 1909    fluticasone (FLONASE) 50 MCG/ACT nasal spray  Daily     11/25/18 1909    loratadine (CLARITIN) 10 MG tablet  Daily     11/25/18 1909    meclizine  (ANTIVERT) 25 MG tablet  3 times daily PRN  11/25/18 1909           Julianne Rice, MD 11/25/18 2122

## 2019-05-04 ENCOUNTER — Other Ambulatory Visit: Payer: Self-pay

## 2019-05-04 ENCOUNTER — Encounter (HOSPITAL_COMMUNITY): Payer: Self-pay

## 2019-05-04 ENCOUNTER — Emergency Department (HOSPITAL_COMMUNITY)
Admission: EM | Admit: 2019-05-04 | Discharge: 2019-05-04 | Disposition: A | Discharge status: Home / Self Care | Payer: BC Managed Care – PPO | Attending: Emergency Medicine | Admitting: Emergency Medicine

## 2019-05-04 DIAGNOSIS — K047 Periapical abscess without sinus: Secondary | ICD-10-CM | POA: Diagnosis not present

## 2019-05-04 DIAGNOSIS — F17210 Nicotine dependence, cigarettes, uncomplicated: Secondary | ICD-10-CM | POA: Diagnosis not present

## 2019-05-04 DIAGNOSIS — R220 Localized swelling, mass and lump, head: Secondary | ICD-10-CM | POA: Diagnosis not present

## 2019-05-04 MED ORDER — PENICILLIN V POTASSIUM 500 MG PO TABS: 500.0000 mg | ORAL_TABLET | Freq: Three times a day (TID) | ORAL | 0 refills | Status: DC

## 2019-05-04 MED ORDER — CLINDAMYCIN PHOSPHATE 600 MG/50ML IV SOLN
600.0000 mg | Freq: Once | INTRAVENOUS | Status: AC
  Administered 2019-05-04: 14:00:00 600 mg via INTRAVENOUS
  Filled 2019-05-04: qty 50

## 2019-05-04 MED ORDER — OXYCODONE-ACETAMINOPHEN 5-325 MG PO TABS: 1.0000 | ORAL_TABLET | ORAL | 0 refills | Status: DC | PRN

## 2019-05-04 NOTE — Discharge Instructions (Signed)
Warm salt water rinses.  Take the antibiotic as directed starting this evening.  Contact 1 of the dentist on the list provided to arrange follow-up.  Return to the ER for any worsening symptoms such as fever, difficulty swallowing or breathing or persistent vomiting.

## 2019-05-04 NOTE — ED Triage Notes (Signed)
Pt reports lower jaw swelling possibly tooth . Swelling since yesterday . Also reports vomiting x3 this morning

## 2019-05-05 NOTE — ED Provider Notes (Signed)
[facility] EMERGENCY DEPARTMENT Provider Note   CSN: [id number] Arrival date & time: 05/04/19  1200     History   Chief Complaint Chief Complaint  Patient presents with  . Facial Swelling    HPI [name] is a 48 y.o. male.     HPI   [name] is a 48 y.o. male who presents to the Emergency Department complaining of left lower jaw pain and facial swelling.  He noticed symptoms one day prior. He reports multiple dental caries and "bad teeth" for some time.  Believes his symptoms may be related to a dental infection.  He also has generalized malaise, nausea and three episodes of vomiting on the morning of arrival.  He has since drank fluids without further vomiting.  He denies fever, chills, abdominal pain, neck pain and difficulty swallowing or breathing.     Past Medical History:  Diagnosis Date  . Brain tumor ([place])   . Rotator cuff tear     Patient Active Problem List   Diagnosis Date Noted  . KNEE, ARTHRITIS, 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  . KNEE PAIN 09/05/2008    Past Surgical History:  Procedure Laterality Date  . ABDOMINAL SURGERY    . BRAIN SURGERY    . CHOLECYSTECTOMY    . ROTATOR CUFF REPAIR    . SINUS SURGERY WITH INSTATRAK          Home Medications    Prior to Admission medications   Medication Sig Start Date End Date Taking? Authorizing Provider  acetaminophen (TYLENOL) 500 MG tablet Take 1,000 mg by mouth every 6 (six) hours as needed.    [provider]  amoxicillin (AMOXIL) 875 MG tablet Take 1 tablet (875 mg total) by mouth 2 (two) times daily. 02/13/18   Lily Kocher, PA-C  amoxicillin-clavulanate (AUGMENTIN) 875-125 MG tablet Take 1 tablet by mouth 2 (two) times daily. One po bid x 7 days 11/25/18   Julianne Rice, MD  clindamycin (CLEOCIN) 150 MG capsule Take 1 capsule (150 mg total) by mouth every 6 (six) hours. 02/14/18   Nat Christen, MD  fluticasone [facility]) 50 MCG/ACT nasal spray Place 2 sprays into both nostrils daily.  11/25/18   Julianne Rice, MD  ibuprofen (ADVIL,MOTRIN) 600 MG tablet Take 1 tablet (600 mg total) by mouth 4 (four) times daily. 02/13/18   Lily Kocher, PA-C  loratadine (CLARITIN) 10 MG tablet Take 1 tablet (10 mg total) by mouth daily. 11/25/18   Julianne Rice, MD  meclizine (ANTIVERT) 25 MG tablet Take 1 tablet (25 mg total) by mouth 3 (three) times daily as needed for dizziness. 11/25/18   Julianne Rice, MD  oxyCODONE-acetaminophen (PERCOCET/ROXICET) 5-325 MG tablet Take 1 tablet by mouth every 4 (four) hours as needed. 05/04/19   Catharina Pica, PA-C  penicillin v potassium (VEETID) 500 MG tablet Take 1 tablet (500 mg total) by mouth 3 (three) times daily. 05/04/19   Kem Parkinson, PA-C    Family History Family History  Problem Relation Age of Onset  . Cancer Father   . Heart failure Other     Social History Social History   Tobacco Use  . Smoking status: Current Every Day Smoker    Packs/day: 0.50    Years: 12.00    Pack years: 6.00    Types: Cigarettes  . Smokeless tobacco: Never Used  Substance Use Topics  . Alcohol use: No  . Drug use: No     Allergies   Patient has no known allergies.  Review of Systems Review of Systems  Constitutional: Negative for appetite change and fever.  HENT: Positive for dental problem and facial swelling. Negative for congestion, ear pain, sore throat and trouble swallowing.   Eyes: Negative for pain and visual disturbance.  Respiratory: Negative for shortness of breath.   Gastrointestinal: Positive for vomiting. Negative for abdominal pain, diarrhea and nausea.  Musculoskeletal: Negative for arthralgias, neck pain and neck stiffness.  Skin: Negative for rash.  Neurological: Negative for dizziness, facial asymmetry, weakness and headaches.  Hematological: Negative for adenopathy.     Physical Exam Updated Vital Signs BP (!) 156/92   Pulse 82   Temp 98 F (36.7 C) (Oral)   Resp 17   Ht 5\' 9"  (1.753 m)   Wt 88.9 kg    SpO2 100%   BMI 28.94 kg/m   Physical Exam Vitals signs and nursing note reviewed.  Constitutional:      Appearance: Normal appearance. He is well-developed. He is not ill-appearing or toxic-appearing.  HENT:     Head:     Jaw: No trismus.     Right Ear: Tympanic membrane and ear canal normal.     Left Ear: Tympanic membrane and ear canal normal.     Mouth/Throat:     Mouth: Mucous membranes are moist.     Dentition: Dental tenderness and dental caries present. No dental abscesses.     Pharynx: Oropharynx is clear. Uvula midline. No uvula swelling.     Comments: widespread dental decay with ttp of the left lower bicuspid. Mild edema of the left lower face.  No trismus.  Uvula is midline and non-edematous.   Neck:     Musculoskeletal: Normal range of motion and neck supple.  Cardiovascular:     Rate and Rhythm: Normal rate and regular rhythm.     Heart sounds: Normal heart sounds. No murmur.  Pulmonary:     Effort: Pulmonary effort is normal.     Breath sounds: Normal breath sounds.  Musculoskeletal: Normal range of motion.  Lymphadenopathy:     Cervical: No cervical adenopathy.  Skin:    General: Skin is warm and dry.  Neurological:     Mental Status: He is alert and oriented to person, place, and time.     Motor: No abnormal muscle tone.     Coordination: Coordination normal.      ED Treatments / Results  Labs (all labs ordered are listed, but only abnormal results are displayed) Labs Reviewed - No data to display  EKG None  Radiology No results found.  Procedures Procedures (including critical care time)  Medications Ordered in ED Medications  clindamycin (CLEOCIN) IVPB 600 mg (0 mg Intravenous Stopped 05/04/19 1427)     Initial Impression / Assessment and Plan / ED Course  I have reviewed the triage vital signs and the nursing notes.  Pertinent labs & imaging results that were available during my care of the patient were reviewed by me and considered  in my medical decision making (see chart for details).        Pt well appearing.  Non-toxic.  Vitals reviewed.  Airway patent and likely developing dental abscess w/o concerning sx's for Ludewig's angina. Facial edema w/o drainable abscess.  Will tx with IV abx and rx for antibiotic and he agrees to close f/u with dentistry.  Return precautions discussed.    Final Clinical Impressions(s) / ED Diagnoses   Final diagnoses:  Dental abscess    ED Discharge Orders  Ordered    penicillin v potassium (VEETID) 500 MG tablet  3 times daily     05/04/19 1430    oxyCODONE-acetaminophen (PERCOCET/ROXICET) 5-325 MG tablet  Every 4 hours PRN     05/04/19 1430           Kem Parkinson, PA-C 05/05/19 2128    Milton Ferguson, MD 05/10/19 1928

## 2019-05-22 ENCOUNTER — Encounter (HOSPITAL_COMMUNITY): Payer: Self-pay | Admitting: Emergency Medicine

## 2019-05-22 ENCOUNTER — Emergency Department (HOSPITAL_COMMUNITY)
Admission: EM | Admit: 2019-05-22 | Discharge: 2019-05-22 | Disposition: A | Discharge status: Home / Self Care | Payer: BC Managed Care – PPO | Attending: Emergency Medicine | Admitting: Emergency Medicine

## 2019-05-22 ENCOUNTER — Other Ambulatory Visit: Payer: Self-pay

## 2019-05-22 DIAGNOSIS — F17210 Nicotine dependence, cigarettes, uncomplicated: Secondary | ICD-10-CM | POA: Diagnosis not present

## 2019-05-22 DIAGNOSIS — L259 Unspecified contact dermatitis, unspecified cause: Secondary | ICD-10-CM | POA: Diagnosis not present

## 2019-05-22 DIAGNOSIS — R21 Rash and other nonspecific skin eruption: Secondary | ICD-10-CM | POA: Diagnosis not present

## 2019-05-22 MED ORDER — HYDROXYZINE HCL 25 MG PO TABS
25.0000 mg | ORAL_TABLET | Freq: Once | ORAL | Status: AC
  Administered 2019-05-22: 25 mg via ORAL
  Filled 2019-05-22: qty 1

## 2019-05-22 MED ORDER — DEXAMETHASONE 4 MG PO TABS: 4.0000 mg | ORAL_TABLET | Freq: Two times a day (BID) | ORAL | 0 refills | Status: DC

## 2019-05-22 MED ORDER — DEXAMETHASONE SODIUM PHOSPHATE 10 MG/ML IJ SOLN
10.0000 mg | Freq: Once | INTRAMUSCULAR | Status: AC
  Administered 2019-05-22: 10 mg via INTRAMUSCULAR
  Filled 2019-05-22: qty 1

## 2019-05-22 MED ORDER — HYDROXYZINE PAMOATE 25 MG PO CAPS: 25.0000 mg | ORAL_CAPSULE | Freq: Three times a day (TID) | ORAL | 0 refills | Status: DC | PRN

## 2019-05-22 MED ORDER — FAMOTIDINE 20 MG PO TABS
20.0000 mg | ORAL_TABLET | Freq: Once | ORAL | Status: AC
Start: 2019-05-22 — End: 2019-05-22
  Administered 2019-05-22: 20 mg via ORAL
  Filled 2019-05-22: qty 1

## 2019-05-22 NOTE — ED Triage Notes (Signed)
Pt c/o rash to fingers, right side and right leg since last night.

## 2019-05-22 NOTE — Discharge Instructions (Addendum)
Your oxygen level is clear.  Which is within normal limits.  Your blood pressure slightly elevated, otherwise your vital signs are within normal limits.  Your examination is negative for anaphylaxis or any acute changes.  Please use the Vistaril 3 times daily as needed for itching.  Please use Decadron 2 times daily until all taken.  Please take these medications with food.  The Vistaril may cause drowsiness, and/or lightheadedness.  Please do not drive a vehicle, operate machinery, drink alcohol, or participate in activities requiring concentration when taking this medication.  Please see Ms. Register or a member of her team if not improving.

## 2019-05-22 NOTE — ED Provider Notes (Signed)
[facility] EMERGENCY DEPARTMENT Provider Note   CSN: [id number] Arrival date & time: 05/22/19  1858     History   Chief Complaint Chief Complaint  Patient presents with  . Rash    HPI Jose Little is a 48 y.o. male.     Patient is a 48 year old male who presents to the emergency department with a complaint of rash.  The patient states he first started noticing this on last night.  He first noticed the rash area on his ankles, it then was noted on his left hand, and then on his right side.  He says it itches intensely, and has a burning sensation at times.  The patient states that he is not taking any new medications.  He is not added any new foods to his diet.  He denies any new clothing.  No new soaps, detergents, or dryer sheets.  No new linens appreciated.  He has not noted any bedbugs of any kind in his bed or couch area.  He does not recall being around any grassy areas for any extended period of time, and he says he does not remember being around any poison ivy or poison oak.  The patient presents to the emergency department because he says over-the-counter creams have not been effective, and it is now interfering with his rest.  The patient denies any difficulty with breathing, swallowing, or speaking.  The history is provided by the patient.  Rash Associated symptoms: no abdominal pain, no diarrhea, no fever, no myalgias, no nausea, no shortness of breath, not vomiting and not wheezing     Past Medical History:  Diagnosis Date  . Brain tumor ([place])   . Rotator cuff tear     Patient Active Problem List   Diagnosis Date Noted  . KNEE, ARTHRITIS, 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  . KNEE PAIN 09/05/2008    Past Surgical History:  Procedure Laterality Date  . ABDOMINAL SURGERY    . BRAIN SURGERY    . CHOLECYSTECTOMY    . ROTATOR CUFF REPAIR    . SINUS SURGERY WITH INSTATRAK          Home Medications    Prior to Admission medications   Medication Sig Start Date End  Date Taking? Authorizing Provider  acetaminophen (TYLENOL) 500 MG tablet Take 1,000 mg by mouth every 6 (six) hours as needed.    [provider]  amoxicillin (AMOXIL) 875 MG tablet Take 1 tablet (875 mg total) by mouth 2 (two) times daily. 02/13/18   Lily Kocher, PA-C  amoxicillin-clavulanate (AUGMENTIN) 875-125 MG tablet Take 1 tablet by mouth 2 (two) times daily. One po bid x 7 days 11/25/18   Julianne Rice, MD  clindamycin (CLEOCIN) 150 MG capsule Take 1 capsule (150 mg total) by mouth every 6 (six) hours. 02/14/18   Nat Christen, MD  fluticasone [facility]) 50 MCG/ACT nasal spray Place 2 sprays into both nostrils daily. 11/25/18   Julianne Rice, MD  ibuprofen (ADVIL,MOTRIN) 600 MG tablet Take 1 tablet (600 mg total) by mouth 4 (four) times daily. 02/13/18   Lily Kocher, PA-C  loratadine (CLARITIN) 10 MG tablet Take 1 tablet (10 mg total) by mouth daily. 11/25/18   Julianne Rice, MD  meclizine (ANTIVERT) 25 MG tablet Take 1 tablet (25 mg total) by mouth 3 (three) times daily as needed for dizziness. 11/25/18   Julianne Rice, MD  oxyCODONE-acetaminophen (PERCOCET/ROXICET) 5-325 MG tablet Take 1 tablet by mouth every 4 (four) hours as needed. 05/04/19   Triplett,  Tammy, PA-C  penicillin v potassium (VEETID) 500 MG tablet Take 1 tablet (500 mg total) by mouth 3 (three) times daily. 05/04/19   Kem Parkinson, PA-C    Family History Family History  Problem Relation Age of Onset  . Cancer Father   . Heart failure Other     Social History Social History   Tobacco Use  . Smoking status: Current Every Day Smoker    Packs/day: 0.50    Years: 12.00    Pack years: 6.00    Types: Cigarettes  . Smokeless tobacco: Never Used  Substance Use Topics  . Alcohol use: No  . Drug use: No     Allergies   Patient has no known allergies.   Review of Systems Review of Systems  Constitutional: Negative for activity change, appetite change and fever.  HENT: Negative for congestion,  ear discharge, ear pain, facial swelling, nosebleeds, rhinorrhea, sneezing and tinnitus.   Eyes: Negative for photophobia, pain and discharge.  Respiratory: Negative for cough, choking, shortness of breath and wheezing.   Cardiovascular: Negative for chest pain, palpitations and leg swelling.  Gastrointestinal: Negative for abdominal pain, blood in stool, constipation, diarrhea, nausea and vomiting.  Genitourinary: Negative for difficulty urinating, dysuria, flank pain, frequency and hematuria.  Musculoskeletal: Negative for back pain, gait problem, myalgias and neck pain.  Skin: Positive for rash. Negative for color change and wound.  Neurological: Negative for dizziness, seizures, syncope, facial asymmetry, speech difficulty, weakness and numbness.  Hematological: Negative for adenopathy. Does not bruise/bleed easily.  Psychiatric/Behavioral: Negative for agitation, confusion, hallucinations, self-injury and suicidal ideas. The patient is not nervous/anxious.      Physical Exam Updated Vital Signs BP (!) 153/82   Pulse 86   Temp 98.2 F (36.8 C)   Resp 18   SpO2 99%   Physical Exam Vitals signs and nursing note reviewed.  Constitutional:      Appearance: He is well-developed. He is not toxic-appearing.  HENT:     Head: Normocephalic.     Right Ear: Tympanic membrane and external ear normal.     Left Ear: Tympanic membrane and external ear normal.  Eyes:     General: Lids are normal.     Pupils: Pupils are equal, round, and reactive to light.  Neck:     Musculoskeletal: Normal range of motion and neck supple.     Vascular: No carotid bruit.  Cardiovascular:     Rate and Rhythm: Normal rate and regular rhythm.     Pulses: Normal pulses.     Heart sounds: Normal heart sounds. No murmur. No friction rub.  Pulmonary:     Effort: No respiratory distress.     Breath sounds: Normal breath sounds. No stridor. No wheezing.  Abdominal:     General: Bowel sounds are normal.      Palpations: Abdomen is soft.     Tenderness: There is no abdominal tenderness. There is no guarding.  Musculoskeletal: Normal range of motion.  Lymphadenopathy:     Head:     Right side of head: No submandibular adenopathy.     Left side of head: No submandibular adenopathy.     Cervical: No cervical adenopathy.  Skin:    General: Skin is warm and dry.     Comments: There is a red slightly raised macular rash of both ankles.  There are blisters on the right ankle, and one denuded area on the right ankle.  There is one denuded area on the dorsum of  the right foot.  There is a rash with blister on the dorsum of the left index finger.  There is some swelling of the left hand.  The patient states that there was rash on the right side extending down toward the lateral hip area, but the rash is no longer at this location during the examination.  Neurological:     Mental Status: He is alert and oriented to person, place, and time.     Cranial Nerves: No cranial nerve deficit.     Sensory: No sensory deficit.  Psychiatric:        Speech: Speech normal.      ED Treatments / Results  Labs (all labs ordered are listed, but only abnormal results are displayed) Labs Reviewed - No data to display  EKG None  Radiology No results found.  Procedures Procedures (including critical care time)  Medications Ordered in ED Medications  dexamethasone (DECADRON) injection 10 mg (has no administration in time range)  hydrOXYzine (ATARAX/VISTARIL) tablet 25 mg (has no administration in time range)  famotidine (PEPCID) tablet 20 mg (has no administration in time range)     Initial Impression / Assessment and Plan / ED Course  I have reviewed the triage vital signs and the nursing notes.  Pertinent labs & imaging results that were available during my care of the patient were reviewed by me and considered in my medical decision making (see chart for details).          Final Clinical  Impressions(s) / ED Diagnoses MDM  Blood pressure slightly elevated at 153/82, otherwise vital signs within normal limits.  Pulse oximetry is 99% on room air.  Within normal limits by my interpretation.  Lungs are clear, speech is clear, patient speaks in complete sentences without problem.  No signs of any anaphylactic response to the rash.  Patient has some denuded areas present from scratching.  I have asked the patient to cleanse these with soap and water and to watch him closely for secondary infection.  Patient will be treated with Vistaril and steroid medication.  Patient is asked to see his primary physician or return to the emergency department if not improving.  Patient also given the information for dermatology if not improving.   Final diagnoses:  Contact dermatitis, unspecified contact dermatitis type, unspecified trigger    ED Discharge Orders         Ordered    hydrOXYzine (VISTARIL) 25 MG capsule  3 times daily PRN     05/22/19 2039    dexamethasone (DECADRON) 4 MG tablet  2 times daily with meals     05/22/19 2039           Lily Kocher, PA-C 05/23/19 1622    [name], MD 05/25/19 [phone]

## 2019-07-29 ENCOUNTER — Emergency Department (HOSPITAL_COMMUNITY): Payer: BC Managed Care – PPO

## 2019-07-29 ENCOUNTER — Emergency Department (HOSPITAL_COMMUNITY)
Admission: EM | Admit: 2019-07-29 | Discharge: 2019-07-29 | Disposition: A | Discharge status: Home / Self Care | Payer: BC Managed Care – PPO | Attending: Emergency Medicine | Admitting: Emergency Medicine

## 2019-07-29 ENCOUNTER — Other Ambulatory Visit: Payer: Self-pay

## 2019-07-29 ENCOUNTER — Encounter (HOSPITAL_COMMUNITY): Payer: Self-pay | Admitting: Emergency Medicine

## 2019-07-29 DIAGNOSIS — Z20828 Contact with and (suspected) exposure to other viral communicable diseases: Secondary | ICD-10-CM | POA: Diagnosis not present

## 2019-07-29 DIAGNOSIS — R509 Fever, unspecified: Secondary | ICD-10-CM | POA: Diagnosis not present

## 2019-07-29 DIAGNOSIS — B349 Viral infection, unspecified: Secondary | ICD-10-CM | POA: Diagnosis not present

## 2019-07-29 DIAGNOSIS — F17210 Nicotine dependence, cigarettes, uncomplicated: Secondary | ICD-10-CM | POA: Diagnosis not present

## 2019-07-29 DIAGNOSIS — Z79899 Other long term (current) drug therapy: Secondary | ICD-10-CM | POA: Diagnosis not present

## 2019-07-29 LAB — URINALYSIS, ROUTINE W REFLEX MICROSCOPIC
Bilirubin Urine: NEGATIVE
Glucose, UA: NEGATIVE mg/dL
Hgb urine dipstick: NEGATIVE
Ketones, ur: NEGATIVE mg/dL
Leukocytes,Ua: NEGATIVE
Nitrite: NEGATIVE
Protein, ur: NEGATIVE mg/dL
Specific Gravity, Urine: 1.019 (ref 1.005–1.030)
pH: 5 (ref 5.0–8.0)

## 2019-07-29 MED ORDER — ACETAMINOPHEN 325 MG PO TABS
650.0000 mg | ORAL_TABLET | Freq: Once | ORAL | Status: AC | PRN
  Administered 2019-07-29: 650 mg via ORAL
  Filled 2019-07-29: qty 2

## 2019-07-29 MED ORDER — ALBUTEROL SULFATE HFA 108 (90 BASE) MCG/ACT IN AERS
2.0000 | INHALATION_SPRAY | Freq: Once | RESPIRATORY_TRACT | Status: AC
  Administered 2019-07-29: 2 via RESPIRATORY_TRACT
  Filled 2019-07-29: qty 6.7

## 2019-07-29 NOTE — ED Notes (Signed)
Pt reports back pain since last night   He has taken no meds for discomfiture He has poor dental care, is disheveled   Reports no flu vaccine

## 2019-07-29 NOTE — Discharge Instructions (Addendum)
Your chest xray is clear today with no signs of pneumonia.  You may continue using the inhaler, taking 2 puffs every 4 hours if you are coughing or wheezing.  Continue taking tylenol for fever control - this should also help with your body aches as well.  You have been tested for Covid 19 today, this test will take 1-2 days to result. You will need to remain isolated at home until this test is resulted and is negative.  If positive, you will be notified and will continue maintain isolation for 10 days or until you are no longer symptomatic, whichever is longer.  Rest and make sure you are drinking plenty of fluids.

## 2019-07-29 NOTE — ED Triage Notes (Signed)
Patient complains of body aches and fever x 1 day. Denies nausea\v\d. States has had nothing for fever.

## 2019-07-29 NOTE — ED Provider Notes (Signed)
[facility] EMERGENCY DEPARTMENT Provider Note   CSN: [id number] Arrival date & time: 07/29/19  1123     History   Chief Complaint Chief Complaint  Patient presents with  . Fever  . Generalized Body Aches    HPI Jose Little is a 48 y.o. 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  He also reports nausea without emesis, no abdominal pain, no diarrhea, dysuria or increased urinary frequency. No loss of taste or smell.  He reports 1/2 ppd smoking hx and history of pneumonia with similar sx today.  However,  Wife also works at [employer] facility currently with Covid outbreak, wife is asymptomatic.   He has had no tx prior to arrival.        The history is provided by the patient.  Fever Associated symptoms: chills and nausea   Associated symptoms: no chest pain, no congestion, no cough, no diarrhea, no headaches, no rash, no sore throat and no vomiting     Past Medical History:  Diagnosis Date  . Brain tumor ([place])   . Rotator cuff tear     Patient Active Problem List   Diagnosis Date Noted  . KNEE, ARTHRITIS, 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  . KNEE PAIN 09/05/2008    Past Surgical History:  Procedure Laterality Date  . ABDOMINAL SURGERY    . BRAIN SURGERY    . CHOLECYSTECTOMY    . ROTATOR CUFF REPAIR    . SINUS SURGERY WITH INSTATRAK          Home Medications    Prior to Admission medications   Medication Sig Start Date End Date Taking? Authorizing Provider  dexamethasone (DECADRON) 4 MG tablet Take 1 tablet (4 mg total) by mouth 2 (two) times daily with a meal. 05/22/19   Lily Kocher, PA-C  hydrOXYzine (VISTARIL) 25 MG capsule Take 1 capsule (25 mg total) by mouth 3 (three) times daily as needed. 05/22/19   Lily Kocher, PA-C   oxyCODONE-acetaminophen (PERCOCET/ROXICET) 5-325 MG tablet Take 1 tablet by mouth every 4 (four) hours as needed. Patient not taking: Reported on 05/22/2019 05/04/19   Triplett, Tammy, PA-C  penicillin v potassium (VEETID) 500 MG tablet Take 1 tablet (500 mg total) by mouth 3 (three) times daily. Patient not taking: Reported on 05/22/2019 05/04/19   Kem Parkinson, PA-C    Family History Family History  Problem Relation Age of Onset  . Cancer Father   . Heart failure Other     Social History Social History   Tobacco Use  . Smoking status: Current Every Day Smoker    Packs/day: 0.50    Years: 12.00    Pack years: 6.00    Types: Cigarettes  . Smokeless tobacco: Never Used  Substance Use Topics  . Alcohol use: No  . Drug use: No     Allergies   Patient has no known allergies.   Review of Systems Review of Systems  Constitutional: Positive for chills and fever.  HENT: Negative for congestion and sore throat.   Eyes: Negative.   Respiratory: Positive for chest tightness. Negative for cough and shortness of breath.   Cardiovascular: Negative for chest pain.  Gastrointestinal: Positive for nausea. Negative for abdominal pain, constipation, diarrhea and vomiting.  Genitourinary: Negative.   Musculoskeletal: Positive for back pain. Negative  for arthralgias, joint swelling and neck pain.  Skin: Negative.  Negative for rash and wound.  Neurological: Negative for dizziness, weakness, light-headedness, numbness and headaches.  Psychiatric/Behavioral: Negative.      Physical Exam Updated Vital Signs BP 135/75 (BP Location: Right Arm)   Pulse 82   Temp 99.8 F (37.7 C) (Oral)   Resp 16   Ht 5\' 10"  (1.778 m)   Wt 90.7 kg   SpO2 97%   BMI 28.70 kg/m   Physical Exam Vitals signs and nursing note reviewed.  Constitutional:      Appearance: He is well-developed.  HENT:     Head: Normocephalic and atraumatic.  Eyes:     Conjunctiva/sclera: Conjunctivae normal.  Neck:      Musculoskeletal: Normal range of motion.  Cardiovascular:     Rate and Rhythm: Normal rate and regular rhythm.     Heart sounds: Normal heart sounds.  Pulmonary:     Effort: Pulmonary effort is normal.     Breath sounds: Normal breath sounds. No wheezing.  Abdominal:     General: Bowel sounds are normal.     Palpations: Abdomen is soft.     Tenderness: There is no abdominal tenderness.  Musculoskeletal: Normal range of motion.  Skin:    General: Skin is warm and dry.  Neurological:     Mental Status: He is alert.      ED Treatments / Results  Labs (all labs ordered are listed, but only abnormal results are displayed) Labs Reviewed  NOVEL CORONAVIRUS, NAA (HOSP ORDER, SEND-OUT TO REF LAB; TAT 18-24 HRS)  URINALYSIS, ROUTINE W REFLEX MICROSCOPIC    EKG None  Radiology Dg Chest Portable 1 View  Result Date: 07/29/2019 CLINICAL DATA:  48 year old male with fever for 1 day. EXAM: PORTABLE CHEST 1 VIEW COMPARISON:  11/25/2018 and prior radiographs FINDINGS: The cardiomediastinal silhouette is unremarkable. There is no evidence of focal airspace disease, pulmonary edema, suspicious pulmonary nodule/mass, pleural effusion, or pneumothorax. No acute bony abnormalities are identified. IMPRESSION: No active disease. Electronically Signed   By: Margarette Canada M.D.   On: 07/29/2019 12:38    Procedures Procedures (including critical care time)  Medications Ordered in ED Medications  acetaminophen (TYLENOL) tablet 650 mg (650 mg Oral Given 07/29/19 1143)  albuterol (VENTOLIN HFA) 108 (90 Base) MCG/ACT inhaler 2 puff (2 puffs Inhalation Given 07/29/19 1225)     Initial Impression / Assessment and Plan / ED Course  I have reviewed the triage vital signs and the nursing notes.  Pertinent labs & imaging results that were available during my care of the patient were reviewed by me and considered in my medical decision making (see chart for details).        Pt with fever, sx  suggesting viral infection.  Imaging and labs reviewed and discussed with pt.  He was screened for covid - send out test.  Discussed home isolation until (negative) test results.  Albuterol mdi given, tylenol for fever reduction.  Return precautions discussed.    Jose Little was evaluated in Emergency Department on 07/29/2019 for the symptoms described in the history of present illness. He was evaluated in the context of the global COVID-19 pandemic, which necessitated consideration that the patient might be at risk for infection with the SARS-CoV-2 virus that causes COVID-19. Institutional protocols and algorithms that pertain to the evaluation of patients at risk for COVID-19 are in a state of rapid change based on information released by regulatory bodies including the  CDC and federal and [employer]. These policies and algorithms were followed during the patient's care in the ED.   Final Clinical Impressions(s) / ED Diagnoses   Final diagnoses:  Viral illness    ED Discharge Orders    None       Landis Martins 07/29/19 1259    Noemi Chapel, MD 07/30/19 [phone]

## 2019-07-29 NOTE — ED Notes (Signed)
Urine to lab

## 2019-07-30 LAB — NOVEL CORONAVIRUS, NAA (HOSP ORDER, SEND-OUT TO REF LAB; TAT 18-24 HRS): SARS-CoV-2, NAA: NOT DETECTED

## 2019-10-09 ENCOUNTER — Other Ambulatory Visit: Payer: Self-pay

## 2019-10-09 ENCOUNTER — Ambulatory Visit: Payer: BC Managed Care – PPO | Attending: Internal Medicine

## 2019-10-09 DIAGNOSIS — Z20828 Contact with and (suspected) exposure to other viral communicable diseases: Secondary | ICD-10-CM | POA: Diagnosis not present

## 2019-10-09 DIAGNOSIS — Z20822 Contact with and (suspected) exposure to covid-19: Secondary | ICD-10-CM

## 2019-10-10 LAB — NOVEL CORONAVIRUS, NAA: SARS-CoV-2, NAA: NOT DETECTED

## 2020-12-15 ENCOUNTER — Other Ambulatory Visit: Payer: Self-pay

## 2020-12-15 ENCOUNTER — Emergency Department (HOSPITAL_COMMUNITY): Payer: BC Managed Care – PPO

## 2020-12-15 ENCOUNTER — Encounter (HOSPITAL_COMMUNITY): Payer: Self-pay

## 2020-12-15 ENCOUNTER — Inpatient Hospital Stay (HOSPITAL_COMMUNITY)
Admission: EM | Admit: 2020-12-15 | Discharge: 2020-12-17 | DRG: 190 | Disposition: A | Discharge status: Home / Self Care | Payer: BC Managed Care – PPO | Attending: Family Medicine | Admitting: Family Medicine

## 2020-12-15 DIAGNOSIS — F17210 Nicotine dependence, cigarettes, uncomplicated: Secondary | ICD-10-CM | POA: Diagnosis not present

## 2020-12-15 DIAGNOSIS — Z20822 Contact with and (suspected) exposure to covid-19: Secondary | ICD-10-CM | POA: Diagnosis not present

## 2020-12-15 DIAGNOSIS — Z8249 Family history of ischemic heart disease and other diseases of the circulatory system: Secondary | ICD-10-CM | POA: Diagnosis not present

## 2020-12-15 DIAGNOSIS — R059 Cough, unspecified: Secondary | ICD-10-CM | POA: Diagnosis not present

## 2020-12-15 DIAGNOSIS — Z7952 Long term (current) use of systemic steroids: Secondary | ICD-10-CM

## 2020-12-15 DIAGNOSIS — J9691 Respiratory failure, unspecified with hypoxia: Secondary | ICD-10-CM | POA: Diagnosis not present

## 2020-12-15 DIAGNOSIS — Z825 Family history of asthma and other chronic lower respiratory diseases: Secondary | ICD-10-CM

## 2020-12-15 DIAGNOSIS — R509 Fever, unspecified: Secondary | ICD-10-CM | POA: Diagnosis not present

## 2020-12-15 DIAGNOSIS — R079 Chest pain, unspecified: Secondary | ICD-10-CM | POA: Diagnosis not present

## 2020-12-15 DIAGNOSIS — J9601 Acute respiratory failure with hypoxia: Secondary | ICD-10-CM | POA: Diagnosis present

## 2020-12-15 DIAGNOSIS — J441 Chronic obstructive pulmonary disease with (acute) exacerbation: Principal | ICD-10-CM | POA: Diagnosis present

## 2020-12-15 DIAGNOSIS — Z720 Tobacco use: Secondary | ICD-10-CM | POA: Diagnosis present

## 2020-12-15 DIAGNOSIS — I10 Essential (primary) hypertension: Secondary | ICD-10-CM | POA: Diagnosis not present

## 2020-12-15 LAB — TROPONIN I (HIGH SENSITIVITY): Troponin I (High Sensitivity): 4 ng/L (ref <18)

## 2020-12-15 LAB — CBC WITH DIFFERENTIAL/PLATELET
Abs Immature Granulocytes: 0.02 10*3/uL (ref 0.00–0.07)
Basophils Absolute: 0.1 10*3/uL (ref 0.0–0.1)
Basophils Relative: 1 %
Eosinophils Absolute: 0.3 10*3/uL (ref 0.0–0.5)
Eosinophils Relative: 3 %
HCT: 43.2 % (ref 39.0–52.0)
Hemoglobin: 14.5 g/dL (ref 13.0–17.0)
Immature Granulocytes: 0 %
Lymphocytes Relative: 13 %
Lymphs Abs: 1.3 10*3/uL (ref 0.7–4.0)
MCH: 29.8 pg (ref 26.0–34.0)
MCHC: 33.6 g/dL (ref 30.0–36.0)
MCV: 88.9 fL (ref 80.0–100.0)
Monocytes Absolute: 0.7 10*3/uL (ref 0.1–1.0)
Monocytes Relative: 7 %
Neutro Abs: 7.8 10*3/uL — ABNORMAL HIGH (ref 1.7–7.7)
Neutrophils Relative %: 76 %
Platelets: 243 10*3/uL (ref 150–400)
RBC: 4.86 MIL/uL (ref 4.22–5.81)
RDW: 14.2 % (ref 11.5–15.5)
WBC: 10.2 10*3/uL (ref 4.0–10.5)
nRBC: 0 % (ref 0.0–0.2)

## 2020-12-15 LAB — BASIC METABOLIC PANEL
Anion gap: 10 (ref 5–15)
BUN: 18 mg/dL (ref 6–20)
CO2: 23 mmol/L (ref 22–32)
Calcium: 8.4 mg/dL — ABNORMAL LOW (ref 8.9–10.3)
Chloride: 107 mmol/L (ref 98–111)
Creatinine, Ser: 1.16 mg/dL (ref 0.61–1.24)
GFR, Estimated: >60 mL/min (ref >60)
Glucose, Bld: 102 mg/dL — ABNORMAL HIGH (ref 70–99)
Potassium: 3.8 mmol/L (ref 3.5–5.1)
Sodium: 140 mmol/L (ref 135–145)

## 2020-12-15 LAB — RESP PANEL BY RT-PCR (FLU A&B, COVID) ARPGX2
Influenza A by PCR: NEGATIVE
Influenza B by PCR: NEGATIVE
SARS Coronavirus 2 by RT PCR: NEGATIVE

## 2020-12-15 LAB — BLOOD GAS, VENOUS
Acid-Base Excess: 2.2 mmol/L — ABNORMAL HIGH (ref 0.0–2.0)
Bicarbonate: 26.1 mmol/L (ref 20.0–28.0)
FIO2: 21
O2 Saturation: 86.9 %
Patient temperature: 37
pCO2, Ven: 40.5 mmHg — ABNORMAL LOW (ref 44.0–60.0)
pH, Ven: 7.427 (ref 7.250–7.430)
pO2, Ven: 51.2 mmHg — ABNORMAL HIGH (ref 32.0–45.0)

## 2020-12-15 LAB — POC SARS CORONAVIRUS 2 AG -  ED: SARS Coronavirus 2 Ag: NEGATIVE

## 2020-12-15 MED ORDER — IPRATROPIUM-ALBUTEROL 0.5-2.5 (3) MG/3ML IN SOLN
3.0000 mL | Freq: Once | RESPIRATORY_TRACT | Status: AC
  Administered 2020-12-15: 3 mL via RESPIRATORY_TRACT
  Filled 2020-12-15: qty 3

## 2020-12-15 MED ORDER — HYDROXYZINE HCL 25 MG PO TABS: 25.0000 mg | ORAL_TABLET | Freq: Three times a day (TID) | ORAL | Status: DC | PRN

## 2020-12-15 MED ORDER — IPRATROPIUM-ALBUTEROL 20-100 MCG/ACT IN AERS
2.0000 | INHALATION_SPRAY | Freq: Four times a day (QID) | RESPIRATORY_TRACT | Status: DC
  Administered 2020-12-15: 2 via RESPIRATORY_TRACT
  Filled 2020-12-15: qty 4

## 2020-12-15 MED ORDER — OXYCODONE HCL 5 MG PO TABS
5.0000 mg | ORAL_TABLET | ORAL | Status: DC | PRN
  Administered 2020-12-16 – 2020-12-17 (×4): 5 mg via ORAL
  Filled 2020-12-15 (×4): qty 1

## 2020-12-15 MED ORDER — ACETAMINOPHEN 325 MG PO TABS
650.0000 mg | ORAL_TABLET | Freq: Four times a day (QID) | ORAL | Status: DC | PRN
  Administered 2020-12-17: 650 mg via ORAL
  Filled 2020-12-15: qty 2

## 2020-12-15 MED ORDER — IPRATROPIUM-ALBUTEROL 0.5-2.5 (3) MG/3ML IN SOLN
3.0000 mL | Freq: Four times a day (QID) | RESPIRATORY_TRACT | Status: DC
  Administered 2020-12-16: 3 mL via RESPIRATORY_TRACT
  Filled 2020-12-15: qty 3

## 2020-12-15 MED ORDER — ALBUTEROL SULFATE (2.5 MG/3ML) 0.083% IN NEBU: 2.5000 mg | INHALATION_SOLUTION | RESPIRATORY_TRACT | Status: DC | PRN

## 2020-12-15 MED ORDER — HEPARIN SODIUM (PORCINE) 5000 UNIT/ML IJ SOLN
5000.0000 [IU] | Freq: Three times a day (TID) | INTRAMUSCULAR | Status: DC
  Administered 2020-12-16 – 2020-12-17 (×4): 5000 [IU] via SUBCUTANEOUS
  Filled 2020-12-15 (×4): qty 1

## 2020-12-15 MED ORDER — AZITHROMYCIN 250 MG PO TABS
500.0000 mg | ORAL_TABLET | Freq: Every day | ORAL | Status: DC
  Administered 2020-12-16: 500 mg via ORAL
  Filled 2020-12-15: qty 2

## 2020-12-15 MED ORDER — AEROCHAMBER PLUS FLO-VU MEDIUM MISC
1.0000 | Freq: Once | Status: DC
  Filled 2020-12-15: qty 1

## 2020-12-15 MED ORDER — HYDROXYZINE PAMOATE 25 MG PO CAPS: 25.0000 mg | ORAL_CAPSULE | Freq: Three times a day (TID) | ORAL | Status: DC | PRN

## 2020-12-15 MED ORDER — ONDANSETRON HCL 4 MG PO TABS: 4.0000 mg | ORAL_TABLET | Freq: Four times a day (QID) | ORAL | Status: DC | PRN

## 2020-12-15 MED ORDER — ALBUTEROL (5 MG/ML) CONTINUOUS INHALATION SOLN
10.0000 mg/h | INHALATION_SOLUTION | Freq: Once | RESPIRATORY_TRACT | Status: AC
  Administered 2020-12-15: 10 mg/h via RESPIRATORY_TRACT
  Filled 2020-12-15: qty 20

## 2020-12-15 MED ORDER — SODIUM CHLORIDE 0.9 % IV SOLN
500.0000 mg | INTRAVENOUS | Status: AC
  Administered 2020-12-15: 500 mg via INTRAVENOUS
  Filled 2020-12-15: qty 500

## 2020-12-15 MED ORDER — MOMETASONE FURO-FORMOTEROL FUM 200-5 MCG/ACT IN AERO
2.0000 | INHALATION_SPRAY | Freq: Two times a day (BID) | RESPIRATORY_TRACT | Status: DC
  Administered 2020-12-16 – 2020-12-17 (×2): 2 via RESPIRATORY_TRACT
  Filled 2020-12-15: qty 8.8

## 2020-12-15 MED ORDER — ONDANSETRON HCL 4 MG/2ML IJ SOLN: 4.0000 mg | Freq: Four times a day (QID) | INTRAMUSCULAR | Status: DC | PRN

## 2020-12-15 MED ORDER — METHYLPREDNISOLONE SODIUM SUCC 125 MG IJ SOLR
60.0000 mg | Freq: Four times a day (QID) | INTRAMUSCULAR | Status: AC
  Administered 2020-12-16 (×4): 60 mg via INTRAVENOUS
  Filled 2020-12-15 (×4): qty 2

## 2020-12-15 MED ORDER — ZOLPIDEM TARTRATE 5 MG PO TABS
5.0000 mg | ORAL_TABLET | Freq: Every evening | ORAL | Status: DC | PRN
  Administered 2020-12-16: 5 mg via ORAL
  Filled 2020-12-15: qty 1

## 2020-12-15 MED ORDER — METHYLPREDNISOLONE SODIUM SUCC 125 MG IJ SOLR
125.0000 mg | Freq: Once | INTRAMUSCULAR | Status: AC
  Administered 2020-12-15: 125 mg via INTRAVENOUS
  Filled 2020-12-15: qty 2

## 2020-12-15 MED ORDER — ALBUTEROL SULFATE (2.5 MG/3ML) 0.083% IN NEBU
INHALATION_SOLUTION | RESPIRATORY_TRACT | Status: AC
  Administered 2020-12-15: 2.5 mg
  Filled 2020-12-15: qty 3

## 2020-12-15 MED ORDER — SODIUM CHLORIDE 0.9 % IV SOLN: INTRAVENOUS | Status: DC

## 2020-12-15 MED ORDER — POLYETHYLENE GLYCOL 3350 17 G PO PACK: 17.0000 g | PACK | Freq: Every day | ORAL | Status: DC | PRN

## 2020-12-15 MED ORDER — ACETAMINOPHEN 650 MG RE SUPP: 650.0000 mg | Freq: Four times a day (QID) | RECTAL | Status: DC | PRN

## 2020-12-15 MED ORDER — PREDNISONE 20 MG PO TABS
40.0000 mg | ORAL_TABLET | Freq: Every day | ORAL | Status: DC
  Administered 2020-12-17: 40 mg via ORAL
  Filled 2020-12-15: qty 2

## 2020-12-15 NOTE — ED Provider Notes (Signed)
[facility] EMERGENCY DEPARTMENT Provider Note   CSN: [id number] Arrival date & time: 12/15/20  1806     History Chief Complaint  Patient presents with  . Fever    Jose Little is a 50 y.o. male.  HPI   Pt is a 50 y/o male with a h/o brain tumor, rotator cuff tear who presents to the ED today for eval of cough, sob and fever. States sxs started 3 days ago. States cough is nonproductive. Reports some wheezing. Denies rhinorrhea, congestion, sore throat, vomiting, diarrhea.  States his whole family has had a cold for the last 2 weeks. States that his family has been tested for COVID and no one has tested positive.   He has not been vaccinated for covid  Past Medical History:  Diagnosis Date  . Brain tumor ([place])   . Rotator cuff tear     Patient Active Problem List   Diagnosis Date Noted  . KNEE, ARTHRITIS, 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  . KNEE PAIN 09/05/2008    Past Surgical History:  Procedure Laterality Date  . ABDOMINAL SURGERY    . BRAIN SURGERY    . CHOLECYSTECTOMY    . ROTATOR CUFF REPAIR    . SINUS SURGERY WITH INSTATRAK         Family History  Problem Relation Age of Onset  . Cancer Father   . Heart failure Other     Social History   Tobacco Use  . Smoking status: Current Every Day Smoker    Packs/day: 0.50    Years: 12.00    Pack years: 6.00    Types: Cigarettes  . Smokeless tobacco: Never Used  Substance Use Topics  . Alcohol use: No  . Drug use: No    Home Medications Prior to Admission medications   Medication Sig Start Date End Date Taking? Authorizing Provider  dexamethasone (DECADRON) 4 MG tablet Take 1 tablet (4 mg total) by mouth 2 (two) times daily with a meal. 05/22/19   Lily Kocher, PA-C  hydrOXYzine (VISTARIL) 25 MG capsule Take 1 capsule (25 mg total) by mouth 3 (three) times daily as needed. 05/22/19   Lily Kocher, PA-C  oxyCODONE-acetaminophen (PERCOCET/ROXICET) 5-325 MG tablet Take 1 tablet by mouth every 4 (four) hours  as needed. Patient not taking: Reported on 05/22/2019 05/04/19   Triplett, Tammy, PA-C  penicillin v potassium (VEETID) 500 MG tablet Take 1 tablet (500 mg total) by mouth 3 (three) times daily. Patient not taking: Reported on 05/22/2019 05/04/19   Kem Parkinson, PA-C    Allergies    Patient has no known allergies.  Review of Systems   Review of Systems  Constitutional: Positive for chills, diaphoresis and fever.  HENT: Negative for congestion, ear pain, rhinorrhea and sore throat.   Eyes: Negative for pain and visual disturbance.  Respiratory: Positive for cough, shortness of breath and wheezing.   Cardiovascular: Negative for chest pain.  Gastrointestinal: Negative for abdominal pain, constipation, diarrhea, nausea and vomiting.  Genitourinary: Negative for dysuria and hematuria.  Musculoskeletal: Negative for back pain.  Skin: Negative for rash.  Neurological: Negative for seizures and syncope.  All other systems reviewed and are negative.   Physical Exam Updated Vital Signs BP (!) 163/93   Pulse 85   Temp 98.9 F (37.2 C) (Oral)   Resp 20   Ht 5\' 9"  (1.753 m)   Wt 95.3 kg   SpO2 93%   BMI 31.01 kg/m   Physical Exam Vitals and nursing note reviewed.  Constitutional:      Appearance: He is well-developed and well-nourished.  HENT:     Head: Normocephalic and atraumatic.  Eyes:     Conjunctiva/sclera: Conjunctivae normal.  Cardiovascular:     Rate and Rhythm: Normal rate and regular rhythm.     Heart sounds: Normal heart sounds. No murmur heard.   Pulmonary:     Effort: Pulmonary effort is normal. No respiratory distress.     Breath sounds: Wheezing present.  Abdominal:     General: Bowel sounds are normal.     Palpations: Abdomen is soft.     Tenderness: There is no abdominal tenderness. There is no guarding or rebound.  Musculoskeletal:        General: No edema.     Cervical back: Neck supple.  Skin:    General: Skin is warm and dry.  Neurological:      Mental Status: He is alert.  Psychiatric:        Mood and Affect: Mood and affect normal.     ED Results / Procedures / Treatments   Labs (all labs ordered are listed, but only abnormal results are displayed) Labs Reviewed  CBC WITH DIFFERENTIAL/PLATELET - Abnormal; Notable for the following components:      Result Value   Neutro Abs 7.8 (*)    All other components within normal limits  BASIC METABOLIC PANEL - Abnormal; Notable for the following components:   Glucose, Bld 102 (*)    Calcium 8.4 (*)    All other components within normal limits  RESP PANEL BY RT-PCR (FLU A&B, COVID) [id number]  POC SARS CORONAVIRUS 2 AG -  ED    EKG EKG Interpretation  Date/Time:  Sunday December 15 2020 18:35:18 EST Ventricular Rate:  89 PR Interval:    QRS Duration: 85 QT Interval:  329 QTC Calculation: 401 R Axis:   72 Text Interpretation: Sinus rhythm Confirmed by Fredia Sorrow [phone]) on 12/15/2020 6:45:23 PM   Radiology DG Chest Portable 1 View  Result Date: 12/15/2020 CLINICAL DATA:  Cough, fever, chest pain and congestion EXAM: PORTABLE CHEST 1 VIEW COMPARISON:  Radiograph 07/29/2019 FINDINGS: No consolidation, features of edema, pneumothorax, or effusion. Pulmonary vascularity is normally distributed. The cardiomediastinal contours are unremarkable. No acute osseous or soft tissue abnormality. Degenerative changes are present in the imaged spine and shoulders. Telemetry leads overlie the chest. Metallic nipple ring projects over the right chest wall. IMPRESSION: No acute cardiopulmonary abnormality. Electronically Signed   By: Lovena Le M.D.   On: 12/15/2020 19:14    Procedures Procedures   Medications Ordered in ED Medications  Ipratropium-Albuterol (COMBIVENT) respimat 2 puff (2 puffs Inhalation Given 12/15/20 2016)  AeroChamber Plus Flo-Vu Medium MISC 1 each (1 each Other Not Given 12/15/20 2019)  albuterol (PROVENTIL,VENTOLIN) solution continuous neb (has no administration in time  range)  methylPREDNISolone sodium succinate (SOLU-MEDROL) 125 mg/2 mL injection 125 mg (125 mg Intravenous Given 12/15/20 1917)  ipratropium-albuterol (DUONEB) 0.5-2.5 (3) MG/3ML nebulizer solution 3 mL (3 mLs Nebulization Given 12/15/20 2043)  albuterol (PROVENTIL) (2.5 MG/3ML) 0.083% nebulizer solution (2.5 mg  Given 12/15/20 2043)    ED Course  I have reviewed the triage vital signs and the nursing notes.  Pertinent labs & imaging results that were available during my care of the patient were reviewed by me and considered in my medical decision making (see chart for details).    MDM Rules/Calculators/A&P  50 y/o M presenting for eval of sob, fevers and cough.   Reviewed/interpreted labs CBC reassuring BMP unremarkable COVID antigen neg COVID PCR is negative  EKG with nsr, no acute ischemic changes  Reviewed/interpreted imaging CXR - No acute cardiopulmonary abnormality  Pt with hx tobacco use. No dx of copd but sounds siginificalty wheezy on exam. Given combivent, solumedrol. On reassessment he states he does not feel significantly improved and his sats are at 87-88% on RA. He was placed on 2L [place] and we will plan for admission.   9:51 PM CONSULT with Dr. Clearence Ped who accepts patient for admission.   Final Clinical Impression(s) / ED Diagnoses Final diagnoses:  Acute respiratory failure with hypoxia [facility])    Rx / DC Orders ED Discharge Orders    None       Bishop [place] 12/15/20 2152    Fredia Sorrow, MD 12/19/20 [phone]

## 2020-12-15 NOTE — H&P (Signed)
TRH H&P    Patient Demographics:    Jose Little, is a 50 y.o. male  MRN: [id number]  DOB - 12/28/1970  Admit Date - 12/15/2020  Referring MD/NP/PA: [place]  Outpatient Primary MD for the patient is Patient, No Pcp Per  Patient coming from: Home  Chief complaint- Fever, cough, dyspnea   HPI:    Jose Little  is a 51 y.o. male, with history of brain tumor, and rotator cuff tear presents to the ED with a chief complaint of fever and chest pressure.  Patient reports symptoms started 2 days ago and they have been progressively worse since then.  He reports associated shortness of breath that is nonexertional, and constant.  He reports that the chest pressure is in the bottom of his chest all the way crossed, and then in the center of his chest that radiates up under his sternum.  It is constant and nonexertional as well.  He also reports a nonproductive cough.  Patient reports that he had a fever at home as high as 104.  He said he has been alternating Tylenol and ibuprofen-most recently taking 600 mg of ibuprofen prior to arrival.  He is taken 3 Covid test at home which have all been negative.  He has tried Alka-Seltzer cold and flu which has not relieved his symptoms.  Patient reports sick contacts within the home.  Patient denies history of asthma or COPD.  He is a current smoker smoking 1 pack/day.  Patient has no other complaints at this time.  Patient does smoke a pack per day, does not drink alcohol, does not use illicit drugs.  Patient is not vaccinated for Covid.  Patient is full code.  In the ED Temp 98.9, heart rate 71-94, respiratory rate 17-22, blood pressure 163/93 White blood cell count 10.2, hemoglobin 14.5 Chemistry panel is mostly unremarkable with a slight bump in creatinine at 1.16 Negative COVID Chest x-ray shows no acute abnormality EKG is without ischemic changes, sinus rhythm, rate 89 Patient  was given Combivent inhaler, DuoNeb, Solu-Medrol.  He continued to wheeze so he was then put on a continuous albuterol neb.  Patient oxygen sats dropped as low as 88% per ED provider, but have improved with current treatments. Admission requested for further breathing treatments and management of COPD     Review of systems:    In addition to the HPI above,  Endorses fever, diaphoresis No Headache, No changes with Vision or hearing, No problems swallowing food or Liquids, Endorses chest pressure, cough, shortness of breath No Abdominal pain, No Nausea or Vomiting, bowel movements are regular, No Blood in stool or Urine, No dysuria, No new skin rashes or bruises, No new joints pains-aches,  No new weakness, tingling, numbness in any extremity, No recent weight gain or loss, No polyuria, polydypsia or polyphagia, No significant Mental Stressors.  All other systems reviewed and are negative.    Past History of the following :    Past Medical History:  Diagnosis Date  . Brain tumor ([place])   . Rotator cuff  tear       Past Surgical History:  Procedure Laterality Date  . ABDOMINAL SURGERY    . BRAIN SURGERY    . CHOLECYSTECTOMY    . ROTATOR CUFF REPAIR    . SINUS SURGERY WITH INSTATRAK        Social History:      Social History   Tobacco Use  . Smoking status: Current Every Day Smoker    Packs/day: 0.50    Years: 12.00    Pack years: 6.00    Types: Cigarettes  . Smokeless tobacco: Never Used  Substance Use Topics  . Alcohol use: No       Family History :     Family History  Problem Relation Age of Onset  . Cancer Father   . Heart failure Other    Family history also includes hypertension, COPD, hyperlipidemia   Home Medications:   Prior to Admission medications   Medication Sig Start Date End Date Taking? Authorizing Provider  dexamethasone (DECADRON) 4 MG tablet Take 1 tablet (4 mg total) by mouth 2 (two) times daily with a meal. 05/22/19   Lily Kocher, PA-C  hydrOXYzine (VISTARIL) 25 MG capsule Take 1 capsule (25 mg total) by mouth 3 (three) times daily as needed. 05/22/19   Lily Kocher, PA-C  oxyCODONE-acetaminophen (PERCOCET/ROXICET) 5-325 MG tablet Take 1 tablet by mouth every 4 (four) hours as needed. Patient not taking: Reported on 05/22/2019 05/04/19   Triplett, Tammy, PA-C  penicillin v potassium (VEETID) 500 MG tablet Take 1 tablet (500 mg total) by mouth 3 (three) times daily. Patient not taking: Reported on 05/22/2019 05/04/19   Kem Parkinson, PA-C     Allergies:    No Known Allergies   Physical Exam:   Vitals  Blood pressure (!) 163/93, pulse 85, temperature 98.9 F (37.2 C), temperature source Oral, resp. rate 20, height 5\' 9"  (1.753 m), weight 95.3 kg, SpO2 95 %.  1.  General: Patient lying supine in bed in no acute distress  2. Psychiatric: Mood and behavior normal for situation, pleasant and cooperative with exam  3. Neurologic: Face is symmetric, speech and language are normal, moves all 4 extremities voluntarily, alert and oriented x3, no focal deficits on limited exam  4. HEENMT:  Head is atraumatic, normocephalic, pupils reactive to light, neck is supple, trachea is midline  5. Respiratory : Diminished breath sounds in the lower lung fields, mild expiratory wheezing, no crackles or rhonchi, no cyanosis, maintaining oxygen saturations on room air, normal respiratory rate  6. Cardiovascular : Heart rate is normal, rhythm is regular, no murmurs rubs or gallops, no peripheral edema, peripheral pulses palpated  7. Gastrointestinal:  Abdomen is soft, nondistended, nontender to palpation, bowel sounds active  8. Skin:  Skin is warm dry and intact without acute lesion on limited exam  9.Musculoskeletal:  No calf tenderness, no peripheral edema, no acute deformities    Data Review:    CBC Recent Labs  Lab 12/15/20 1840  WBC 10.2  HGB 14.5  HCT 43.2  PLT 243  MCV 88.9  MCH 29.8  MCHC 33.6   RDW 14.2  LYMPHSABS 1.3  MONOABS 0.7  EOSABS 0.3  BASOSABS 0.1   ------------------------------------------------------------------------------------------------------------------  Results for orders placed or performed during the hospital encounter of 12/15/20 (from the past 48 hour(s))  CBC with Differential     Status: Abnormal   Collection Time: 12/15/20  6:40 PM  Result Value Ref Range   WBC 10.2 4.0 - 10.5  K/uL   RBC 4.86 4.22 - 5.81 MIL/uL   Hemoglobin 14.5 13.0 - 17.0 g/dL   HCT 43.2 39.0 - 52.0 %   MCV 88.9 80.0 - 100.0 fL   MCH 29.8 26.0 - 34.0 pg   MCHC 33.6 30.0 - 36.0 g/dL   RDW 14.2 11.5 - 15.5 %   Platelets 243 150 - 400 K/uL   nRBC 0.0 0.0 - 0.2 %   Neutrophils Relative % 76 %   Neutro Abs 7.8 (H) 1.7 - 7.7 K/uL   Lymphocytes Relative 13 %   Lymphs Abs 1.3 0.7 - 4.0 K/uL   Monocytes Relative 7 %   Monocytes Absolute 0.7 0.1 - 1.0 K/uL   Eosinophils Relative 3 %   Eosinophils Absolute 0.3 0.0 - 0.5 K/uL   Basophils Relative 1 %   Basophils Absolute 0.1 0.0 - 0.1 K/uL   Immature Granulocytes 0 %   Abs Immature Granulocytes 0.02 0.00 - 0.07 K/uL    Comment: Performed at [facility], [address], [place], [place] [address]  Basic metabolic panel     Status: Abnormal   Collection Time: 12/15/20  6:40 PM  Result Value Ref Range   Sodium 140 135 - 145 mmol/L   Potassium 3.8 3.5 - 5.1 mmol/L   Chloride 107 98 - 111 mmol/L   CO2 23 22 - 32 mmol/L   Glucose, Bld 102 (H) 70 - 99 mg/dL    Comment: Glucose reference range applies only to samples taken after fasting for at least 8 hours.   BUN 18 6 - 20 mg/dL   Creatinine, Ser 1.16 0.61 - 1.24 mg/dL   Calcium 8.4 (L) 8.9 - 10.3 mg/dL   GFR, Estimated >60 >60 mL/min    Comment: (NOTE) Calculated using the CKD-EPI Creatinine Equation (2021)    Anion gap 10 5 - 15    Comment: Performed at [facility], [address] [place], [place] [address]  POC SARS Coronavirus 2 Ag-ED - Nasal Swab     Status: None    Collection Time: 12/15/20  7:06 PM  Result Value Ref Range   SARS Coronavirus 2 Ag NEGATIVE NEGATIVE    Comment: (NOTE) SARS-CoV-2 antigen NOT DETECTED.   Negative results are presumptive.  Negative results do not preclude SARS-CoV-2 infection and should not be used as the sole basis for treatment or other patient management decisions, including infection  control decisions, particularly in the presence of clinical signs and  symptoms consistent with COVID-19, or in those who have been in contact with the virus.  Negative results must be combined with clinical observations, patient history, and epidemiological information. The expected result is Negative.  Fact Sheet for Patients: HandmadeRecipes.com.cy  Fact Sheet for Healthcare Providers: FuneralLife.at  This test is not yet approved or cleared by the [place] FDA and  has been authorized for detection and/or diagnosis of SARS-CoV-2 by FDA under an Emergency Use Authorization (EUA).  This EUA will remain in effect (meaning this test can be used) for the duration of  the COV ID-19 declaration under Section 564(b)(1) of the Act, 21 U.S.C. section 360bbb-3(b)(1), unless the authorization is terminated or revoked sooner.    Resp Panel by RT-PCR (Flu A&B, Covid) Nasopharyngeal Swab     Status: None   Collection Time: 12/15/20  7:16 PM   Specimen: Nasopharyngeal Swab; Nasopharyngeal(NP) swabs in vial transport medium  Result Value Ref Range   SARS Coronavirus 2 by RT PCR NEGATIVE NEGATIVE    Comment: (  NOTE) SARS-CoV-2 target nucleic acids are NOT DETECTED.  The SARS-CoV-2 RNA is generally detectable in upper respiratory specimens during the acute phase of infection. The lowest concentration of SARS-CoV-2 viral copies this assay can detect is 138 copies/mL. A negative result does not preclude SARS-Cov-2 infection and should not be used as the sole basis for treatment or other  patient management decisions. A negative result may occur with  improper specimen collection/handling, submission of specimen other than nasopharyngeal swab, presence of viral mutation(s) within the areas targeted by this assay, and inadequate number of viral copies(<138 copies/mL). A negative result must be combined with clinical observations, patient history, and epidemiological information. The expected result is Negative.  Fact Sheet for Patients:  EntrepreneurPulse.com.au  Fact Sheet for Healthcare Providers:  IncredibleEmployment.be  This test is no t yet approved or cleared by the [place] FDA and  has been authorized for detection and/or diagnosis of SARS-CoV-2 by FDA under an Emergency Use Authorization (EUA). This EUA will remain  in effect (meaning this test can be used) for the duration of the COVID-19 declaration under Section 564(b)(1) of the Act, 21 U.S.C.section 360bbb-3(b)(1), unless the authorization is terminated  or revoked sooner.       Influenza A by PCR NEGATIVE NEGATIVE   Influenza B by PCR NEGATIVE NEGATIVE    Comment: (NOTE) The Xpert Xpress SARS-CoV-2/FLU/RSV plus assay is intended as an aid in the diagnosis of influenza from Nasopharyngeal swab specimens and should not be used as a sole basis for treatment. Nasal washings and aspirates are unacceptable for Xpert Xpress SARS-CoV-2/FLU/RSV testing.  Fact Sheet for Patients: EntrepreneurPulse.com.au  Fact Sheet for Healthcare Providers: IncredibleEmployment.be  This test is not yet approved or cleared by the [place] FDA and has been authorized for detection and/or diagnosis of SARS-CoV-2 by FDA under an Emergency Use Authorization (EUA). This EUA will remain in effect (meaning this test can be used) for the duration of the COVID-19 declaration under Section 564(b)(1) of the Act, 21 U.S.C. section 360bbb-3(b)(1), unless  the authorization is terminated or revoked.  Performed at [facility], [address] [place], [place] [address]     Chemistries  Recent Labs  Lab 12/15/20 1840  NA 140  K 3.8  CL 107  CO2 23  GLUCOSE 102*  BUN 18  CREATININE 1.16  CALCIUM 8.4*   ------------------------------------------------------------------------------------------------------------------  ------------------------------------------------------------------------------------------------------------------ GFR: Estimated Creatinine Clearance: 88.7 mL/min (by C-G formula based on SCr of 1.16 mg/dL). Liver Function Tests: No results for input(s): AST, ALT, ALKPHOS, BILITOT, PROT, ALBUMIN in the last 168 hours. No results for input(s): LIPASE, AMYLASE in the last 168 hours. No results for input(s): AMMONIA in the last 168 hours. Coagulation Profile: No results for input(s): INR, PROTIME in the last 168 hours. Cardiac Enzymes: No results for input(s): CKTOTAL, CKMB, CKMBINDEX, TROPONINI in the last 168 hours. BNP (last 3 results) No results for input(s): PROBNP in the last 8760 hours. HbA1C: No results for input(s): HGBA1C in the last 72 hours. CBG: No results for input(s): GLUCAP in the last 168 hours. Lipid Profile: No results for input(s): CHOL, HDL, LDLCALC, TRIG, CHOLHDL, LDLDIRECT in the last 72 hours. Thyroid Function Tests: No results for input(s): TSH, T4TOTAL, FREET4, T3FREE, THYROIDAB in the last 72 hours. Anemia Panel: No results for input(s): VITAMINB12, FOLATE, FERRITIN, TIBC, IRON, RETICCTPCT in the last 72 hours.  --------------------------------------------------------------------------------------------------------------- Urine analysis:    Component Value Date/Time   COLORURINE YELLOW 07/29/2019 [place] 07/29/2019 1201  LABSPEC 1.019 07/29/2019 1201   PHURINE 5.0 07/29/2019 1201   GLUCOSEU NEGATIVE 07/29/2019 1201   HGBUR NEGATIVE 07/29/2019 [place] 07/29/2019 [place] 07/29/2019 1201   PROTEINUR NEGATIVE 07/29/2019 1201   UROBILINOGEN 0.2 06/24/2015 2000   NITRITE NEGATIVE 07/29/2019 1201   LEUKOCYTESUR NEGATIVE 07/29/2019 1201      Imaging Results:    DG Chest Portable 1 View  Result Date: 12/15/2020 CLINICAL DATA:  Cough, fever, chest pain and congestion EXAM: PORTABLE CHEST 1 VIEW COMPARISON:  Radiograph 07/29/2019 FINDINGS: No consolidation, features of edema, pneumothorax, or effusion. Pulmonary vascularity is normally distributed. The cardiomediastinal contours are unremarkable. No acute osseous or soft tissue abnormality. Degenerative changes are present in the imaged spine and shoulders. Telemetry leads overlie the chest. Metallic nipple ring projects over the right chest wall. IMPRESSION: No acute cardiopulmonary abnormality. Electronically Signed   By: Lovena Le M.D.   On: 12/15/2020 19:14    My personal review of EKG: Rhythm NSR, Rate 89 /min, QTc 401 ,no Acute ST changes   Assessment & Plan:    Principal Problem:   COPD with acute exacerbation (HCC) Active Problems:   Respiratory failure with hypoxia (HCC)   Tobacco abuse   1. Respiratory failure with hypoxia 1. CXR with no acute abnormality 2. EKG without ischemic changes 3. Troponin pending 4. VBG pending 5. Covid negative 6. Reported O2 sats down to 80s on room air 7. Most likely 2/2 COPD exacerbation 8. Albuterol continuous, combivent, DuoNeb, solumedrol given in ED 9. Continue solumedrol, albuterol PRN, Duoneb scheduled 10. Start zithromax -patient reports fever at home 2. COPD exacerbation 1. See plan above 3. Tobacco abuse 1. Counseled on the importance of cessation and the progressive nature of COPD 2. Nicotine patch requested 4.    DVT Prophylaxis-   Heparin- SCDs   AM Labs Ordered, also please review Full Orders  Family Communication: No family at bedside Code Status:  FULL  Admission status:  Observation Time spent in minutes : [place] DO          .

## 2020-12-15 NOTE — ED Notes (Signed)
Ambulated in room patient states feeling short of breath oxygen remained 93 to 95% on room air

## 2020-12-15 NOTE — ED Triage Notes (Signed)
Pt from home, states he began coughing with fever of 103.0 this past Friday (2 days). Has taken 3 COVID tests at home and states they were all negative. States his entire family has been sick. Took 600mg  motrin at 5:30pm today. Feels chest tightness as well.

## 2020-12-16 DIAGNOSIS — Z20822 Contact with and (suspected) exposure to covid-19: Secondary | ICD-10-CM | POA: Diagnosis present

## 2020-12-16 DIAGNOSIS — Z825 Family history of asthma and other chronic lower respiratory diseases: Secondary | ICD-10-CM | POA: Diagnosis not present

## 2020-12-16 DIAGNOSIS — J9691 Respiratory failure, unspecified with hypoxia: Secondary | ICD-10-CM | POA: Diagnosis present

## 2020-12-16 DIAGNOSIS — F17210 Nicotine dependence, cigarettes, uncomplicated: Secondary | ICD-10-CM | POA: Diagnosis present

## 2020-12-16 DIAGNOSIS — J9601 Acute respiratory failure with hypoxia: Secondary | ICD-10-CM | POA: Diagnosis present

## 2020-12-16 DIAGNOSIS — J441 Chronic obstructive pulmonary disease with (acute) exacerbation: Secondary | ICD-10-CM | POA: Diagnosis present

## 2020-12-16 DIAGNOSIS — I10 Essential (primary) hypertension: Secondary | ICD-10-CM | POA: Diagnosis present

## 2020-12-16 DIAGNOSIS — Z8249 Family history of ischemic heart disease and other diseases of the circulatory system: Secondary | ICD-10-CM | POA: Diagnosis not present

## 2020-12-16 DIAGNOSIS — Z7952 Long term (current) use of systemic steroids: Secondary | ICD-10-CM | POA: Diagnosis not present

## 2020-12-16 LAB — CBC
HCT: 43.1 % (ref 39.0–52.0)
Hemoglobin: 13.9 g/dL (ref 13.0–17.0)
MCH: 29.1 pg (ref 26.0–34.0)
MCHC: 32.3 g/dL (ref 30.0–36.0)
MCV: 90.4 fL (ref 80.0–100.0)
Platelets: 242 10*3/uL (ref 150–400)
RBC: 4.77 MIL/uL (ref 4.22–5.81)
RDW: 13.9 % (ref 11.5–15.5)
WBC: 9.7 10*3/uL (ref 4.0–10.5)
nRBC: 0 % (ref 0.0–0.2)

## 2020-12-16 LAB — COMPREHENSIVE METABOLIC PANEL
ALT: 22 U/L (ref 0–44)
AST: 23 U/L (ref 15–41)
Albumin: 3.6 g/dL (ref 3.5–5.0)
Alkaline Phosphatase: 64 U/L (ref 38–126)
Anion gap: 11 (ref 5–15)
BUN: 20 mg/dL (ref 6–20)
CO2: 23 mmol/L (ref 22–32)
Calcium: 8.8 mg/dL — ABNORMAL LOW (ref 8.9–10.3)
Chloride: 107 mmol/L (ref 98–111)
Creatinine, Ser: 0.89 mg/dL (ref 0.61–1.24)
GFR, Estimated: >60 mL/min (ref >60)
Glucose, Bld: 154 mg/dL — ABNORMAL HIGH (ref 70–99)
Potassium: 4.1 mmol/L (ref 3.5–5.1)
Sodium: 141 mmol/L (ref 135–145)
Total Bilirubin: 0.2 mg/dL — ABNORMAL LOW (ref 0.3–1.2)
Total Protein: 6.5 g/dL (ref 6.5–8.1)

## 2020-12-16 LAB — TROPONIN I (HIGH SENSITIVITY): Troponin I (High Sensitivity): 3 ng/L (ref <18)

## 2020-12-16 LAB — MAGNESIUM: Magnesium: 1.8 mg/dL (ref 1.7–2.4)

## 2020-12-16 LAB — HIV ANTIBODY (ROUTINE TESTING W REFLEX): HIV Screen 4th Generation wRfx: NONREACTIVE

## 2020-12-16 MED ORDER — GUAIFENESIN-DM 100-10 MG/5ML PO SYRP
10.0000 mL | ORAL_SOLUTION | Freq: Three times a day (TID) | ORAL | Status: DC
  Administered 2020-12-16 – 2020-12-17 (×3): 10 mL via ORAL
  Filled 2020-12-16 (×3): qty 10

## 2020-12-16 MED ORDER — IPRATROPIUM-ALBUTEROL 0.5-2.5 (3) MG/3ML IN SOLN
3.0000 mL | Freq: Two times a day (BID) | RESPIRATORY_TRACT | Status: DC
  Administered 2020-12-16 – 2020-12-17 (×2): 3 mL via RESPIRATORY_TRACT
  Filled 2020-12-16 (×2): qty 3

## 2020-12-16 MED ORDER — NICOTINE 21 MG/24HR TD PT24
21.0000 mg | MEDICATED_PATCH | Freq: Every day | TRANSDERMAL | Status: DC
  Administered 2020-12-16 – 2020-12-17 (×2): 21 mg via TRANSDERMAL
  Filled 2020-12-16 (×2): qty 1

## 2020-12-16 NOTE — Progress Notes (Signed)
Dulera placed in room °

## 2020-12-16 NOTE — Progress Notes (Addendum)
PROGRESS NOTE    Patient: Jose Little                            PCP: Patient, No Pcp Per                    DOB: 22-Jan-1971            DOA: 12/15/2020 ZSW:[id number]             DOS: 12/16/2020, 7:59 AM   LOS: 0 days   Date of Service: The patient was seen and examined on 12/16/2020  Subjective:   The patient was seen and examined this morning. Stable at this time.  Still complaining shortness of breath and cough with minimal exertion. Nursing staff and wife at bedside reports that he desats when he falls asleep Audibly wheezing or rhonchi   Brief Narrative:   Jose Little  is a 49 y.o. male, with history of brain tumor, and rotator cuff tear presents to the ED with a chief complaint of fever and chest pressure.    For past 2 days, associate with shortness of breath.  Subjective fever at home ?104.  He is taken 3 Covid test at home which have all been negative.   Patient denies history of asthma or COPD, not O2 dependent.  He is a current smoker smoking 1 pack/day.   ED Temp 98.9, heart rate 71-94, RR17-22, BP 163/93 WBC 10.2, hemoglobin 14.5 Chemistry panel is mostly unremarkable with a slight bump in creatinine at 1.16 Negative COVID Chest x-ray shows no acute abnormality EKG is without ischemic changes, sinus rhythm, rate 89 Patient was given Combivent inhaler, DuoNeb, Solu-Medrol.  He continued to wheeze so he was then put on a continuous albuterol neb.  Patient oxygen sats dropped as low as 88% per ED provider, but have improved with current treatments. Admission requested for further breathing treatments and management      Assessment & Plan:   Principal Problem:   COPD with acute exacerbation ([place]) Active Problems:   Respiratory failure with hypoxia (HCC)   Tobacco abuse     Acute respiratory failure with hypoxia -bronchitis 1. Still complaining of shortness of breath cough especially with exertion 2. Audible wheezing and rhonchi 3. CXR with no acute  abnormality 4. Unable to complete a sentence due to shortness of breath 5. On 2 L of oxygen, satting 96%, weaning off oxygen, maintaining O2 sat > 94% 6. VBG pending 7. Influenza A/B, SARS-CoV-2 negative 8. Reported O2 sats down to 80s on room air 9. Likely bronchitis with COPD exacerbation -continue steroids and p.o. antibiotics 10. Albuterol continuous, combivent, DuoNeb, solumedrol given in ED 11. Continue solumedrol, albuterol PRN, Duoneb scheduled 12. We will continue p.o. zithromax -patient reports fever at home 13. Try to obtain sputum cultures    Chest pain-pleuritic  -As needed analgesics -Pleuritic chest pain due to persistent cough, shortness of breath -Troponins negative x2 -EKG reviewed negative any ischemic changes -As needed analgesics   COPD exacerbation  14. Continue management as above, IV steroids, inhalers, DuoNeb bronchodilators 15. Patient will be referred to pulmonology as an outpatient 16. Strongly recommended to succeed from tobacco use and abuse    hypertension  -Likely new diagnosis, blood pressure remained elevated -May be elevated due to acute respiratory failure -Monitor closely, as needed hydralazine -Once patient stabilized will determine if he needs BP medications before discharge   tobacco abuse 1. Counseled  on the importance of cessation and the progressive nature of COPD 2. Nicotine patch requested  ------------------------------------------------------------------------------------------------------------------------------- Cultures; Sputum Culture >>   Antimicrobials: Azithromycin   Consultants: None   ------------------------------------------------------------------------------------------------------------------------------------------------  DVT prophylaxis:  SCD/Compression stockings and Heparin SQ Code Status:   Code Status: Full Code  Family Communication: Discussed with patient and his wife at bedside The above  findings and plan of care has been discussed with patient (his wife)  in detail,  they expressed understanding and agreement of above. -Advance care planning has been discussed.   Admission status:   Status is: Inpatient  Patient meets the inpatient criteria requiring scheduled breathing treatments IV steroids supplemental oxygen  Dispo: The patient is from: Home              Anticipated d/c is to: Home in 1 -2  day               Patient currently is not medically stable to d/c.   Difficult to place patient No      Level of care: Telemetry   Procedures:   No admission procedures for hospital encounter.     Antimicrobials:  Anti-infectives (From admission, onward)   Start     Dose/Rate Route Frequency Ordered Stop   12/16/20 2115  azithromycin (ZITHROMAX) tablet 500 mg       "Followed by" Linked Group Details   500 mg Oral Daily at bedtime 12/15/20 2321 12/20/20 2159   12/15/20 2330  azithromycin (ZITHROMAX) 500 mg in sodium chloride 0.9 % 250 mL IVPB       "Followed by" Linked Group Details   500 mg 250 mL/hr over 60 Minutes Intravenous Every 24 hours 12/15/20 2321 12/16/20 0050       Medication:  . AeroChamber Plus Flo-Vu Medium  1 each Other Once  . azithromycin  500 mg Oral QHS  . heparin  5,000 Units Subcutaneous Q8H  . ipratropium-albuterol  3 mL Nebulization BID  . methylPREDNISolone (SOLU-MEDROL) injection  60 mg Intravenous Q6H   Followed by  . [START ON 12/17/2020] predniSONE  40 mg Oral Q breakfast  . mometasone-formoterol  2 puff Inhalation BID  . nicotine  21 mg Transdermal Daily    acetaminophen **OR** acetaminophen, albuterol, hydrOXYzine, ondansetron **OR** ondansetron (ZOFRAN) IV, oxyCODONE, polyethylene glycol, zolpidem   Objective:   Vitals:   12/15/20 2317 12/16/20 0224 12/16/20 0622 12/16/20 0722  BP: (!) 158/96 140/73 (!) 152/76   Pulse: 88 88 77   Resp: 18 18 18    Temp: 98.6 F (37 C) 98.2 F (36.8 C) 98.5 F (36.9 C)   TempSrc:  Oral Oral Oral   SpO2: 94% 91% 95% 96%  Weight: 94.8 kg     Height: 5\' 8"  (1.727 m)       Intake/Output Summary (Last 24 hours) at 12/16/2020 0759 Last data filed at 12/16/2020 3825 Gross per 24 hour  Intake --  Output 1000 ml  Net -1000 ml   Filed Weights   12/15/20 1830 12/15/20 2317  Weight: 95.3 kg 94.8 kg     Examination:   Physical Exam  Constitution:  Alert, cooperative, no distress,  Appears calm and comfortable  Psychiatric: Normal and stable mood and affect, cognition intact,   HEENT: Normocephalic, PERRL, otherwise with in Normal limits  Chest:Chest symmetric Cardio vascular:  S1/S2, RRR, No murmure, No Rubs or Gallops  pulmonary: Clear to auscultation bilaterally, respirations unlabored, negative wheezes / crackles Abdomen: Soft, non-tender, non-distended, bowel sounds,no masses, no organomegaly Muscular  skeletal: Limited exam - in bed, able to move all 4 extremities, Normal strength,  Neuro: CNII-XII intact. , normal motor and sensation, reflexes intact  Extremities: No pitting edema lower extremities, +2 pulses  Skin: Dry, warm to touch, negative for any Rashes, No open wounds Wounds: per nursing documentation    ------------------------------------------------------------------------------------------------------------------------------------------    LABs:  CBC Latest Ref Rng & Units 12/16/2020 12/15/2020 11/25/2018  WBC 4.0 - 10.5 K/uL 9.7 10.2 4.0  Hemoglobin 13.0 - 17.0 g/dL 13.9 14.5 14.4  Hematocrit 39.0 - 52.0 % 43.1 43.2 44.0  Platelets 150 - 400 K/uL 242 243 182   CMP Latest Ref Rng & Units 12/16/2020 12/15/2020 11/25/2018  Glucose 70 - 99 mg/dL 154(H) 102(H) 111(H)  BUN 6 - 20 mg/dL 20 18 19   Creatinine 0.61 - 1.24 mg/dL 0.89 1.16 0.90  Sodium 135 - 145 mmol/L 141 140 138  Potassium 3.5 - 5.1 mmol/L 4.1 3.8 3.9  Chloride 98 - 111 mmol/L 107 107 106  CO2 22 - 32 mmol/L 23 23 24   Calcium 8.9 - 10.3 mg/dL 8.8(L) 8.4(L) 8.0(L)  Total Protein 6.5 - 8.1  g/dL 6.5 - 6.2(L)  Total Bilirubin 0.3 - 1.2 mg/dL 0.2(L) - 0.5  Alkaline Phos 38 - 126 U/L 64 - 59  AST 15 - 41 U/L 23 - 24  ALT 0 - 44 U/L 22 - 24       Micro Results Recent Results (from the past 240 hour(s))  Resp Panel by RT-PCR (Flu A&B, Covid) Nasopharyngeal Swab     Status: None   Collection Time: 12/15/20  7:16 PM   Specimen: Nasopharyngeal Swab; Nasopharyngeal(NP) swabs in vial transport medium  Result Value Ref Range Status   SARS Coronavirus 2 by RT PCR NEGATIVE NEGATIVE Final    Comment: (NOTE) SARS-CoV-2 target nucleic acids are NOT DETECTED.  The SARS-CoV-2 RNA is generally detectable in upper respiratory specimens during the acute phase of infection. The lowest concentration of SARS-CoV-2 viral copies this assay can detect is 138 copies/mL. A negative result does not preclude SARS-Cov-2 infection and should not be used as the sole basis for treatment or other patient management decisions. A negative result may occur with  improper specimen collection/handling, submission of specimen other than nasopharyngeal swab, presence of viral mutation(s) within the areas targeted by this assay, and inadequate number of viral copies(<138 copies/mL). A negative result must be combined with clinical observations, patient history, and epidemiological information. The expected result is Negative.  Fact Sheet for Patients:  EntrepreneurPulse.com.au  Fact Sheet for Healthcare Providers:  IncredibleEmployment.be  This test is no t yet approved or cleared by the [place] FDA and  has been authorized for detection and/or diagnosis of SARS-CoV-2 by FDA under an Emergency Use Authorization (EUA). This EUA will remain  in effect (meaning this test can be used) for the duration of the COVID-19 declaration under Section 564(b)(1) of the Act, 21 U.S.C.section 360bbb-3(b)(1), unless the authorization is terminated  or revoked sooner.        Influenza A by PCR NEGATIVE NEGATIVE Final   Influenza B by PCR NEGATIVE NEGATIVE Final    Comment: (NOTE) The Xpert Xpress SARS-CoV-2/FLU/RSV plus assay is intended as an aid in the diagnosis of influenza from Nasopharyngeal swab specimens and should not be used as a sole basis for treatment. Nasal washings and aspirates are unacceptable for Xpert Xpress SARS-CoV-2/FLU/RSV testing.  Fact Sheet for Patients: EntrepreneurPulse.com.au  Fact Sheet for Healthcare Providers: IncredibleEmployment.be  This test is not  yet approved or cleared by the [place] and has been authorized for detection and/or diagnosis of SARS-CoV-2 by FDA under an Emergency Use Authorization (EUA). This EUA will remain in effect (meaning this test can be used) for the duration of the COVID-19 declaration under Section 564(b)(1) of the Act, 21 U.S.C. section 360bbb-3(b)(1), unless the authorization is terminated or revoked.  Performed at [facility], [address], [place], [place] [address]     Radiology Reports DG Chest Portable 1 View  Result Date: 12/15/2020 CLINICAL DATA:  Cough, fever, chest pain and congestion EXAM: PORTABLE CHEST 1 VIEW COMPARISON:  Radiograph 07/29/2019 FINDINGS: No consolidation, features of edema, pneumothorax, or effusion. Pulmonary vascularity is normally distributed. The cardiomediastinal contours are unremarkable. No acute osseous or soft tissue abnormality. Degenerative changes are present in the imaged spine and shoulders. Telemetry leads overlie the chest. Metallic nipple ring projects over the right chest wall. IMPRESSION: No acute cardiopulmonary abnormality. Electronically Signed   By: Lovena Le M.D.   On: 12/15/2020 19:14    SIGNED: Deatra James, MD, FHM. [facility],  Pager (please use [email or web address] to page/text) Please use Epic Secure Chat for non-urgent communication (7AM-7PM)  If 7PM-7AM, please contact  night-coverage [email or web address], 12/16/2020, 7:59 AM

## 2020-12-17 MED ORDER — MOMETASONE FURO-FORMOTEROL FUM 200-5 MCG/ACT IN AERO: 2.0000 | INHALATION_SPRAY | Freq: Two times a day (BID) | RESPIRATORY_TRACT | 3 refills | Status: DC

## 2020-12-17 MED ORDER — TRIAMTERENE-HCTZ 37.5-25 MG PO CAPS: 1.0000 | ORAL_CAPSULE | Freq: Every day | ORAL | 2 refills | Status: DC

## 2020-12-17 MED ORDER — AZITHROMYCIN 500 MG PO TABS: 500.0000 mg | ORAL_TABLET | Freq: Every day | ORAL | 0 refills | Status: AC

## 2020-12-17 MED ORDER — GUAIFENESIN-DM 100-10 MG/5ML PO SYRP: 10.0000 mL | ORAL_SOLUTION | Freq: Three times a day (TID) | ORAL | 0 refills | Status: AC

## 2020-12-17 MED ORDER — IPRATROPIUM-ALBUTEROL 0.5-2.5 (3) MG/3ML IN SOLN: 3.0000 mL | Freq: Four times a day (QID) | RESPIRATORY_TRACT | 3 refills | Status: DC | PRN

## 2020-12-17 MED ORDER — ACETAMINOPHEN 325 MG PO TABS: 650.0000 mg | ORAL_TABLET | Freq: Four times a day (QID) | ORAL | 0 refills | Status: AC | PRN

## 2020-12-17 MED ORDER — BUDESONIDE 0.5 MG/2ML IN SUSP
0.5000 mg | Freq: Two times a day (BID) | RESPIRATORY_TRACT | Status: DC
  Administered 2020-12-17: 0.5 mg via RESPIRATORY_TRACT
  Filled 2020-12-17: qty 2

## 2020-12-17 MED ORDER — METHYLPREDNISOLONE 4 MG PO TBPK: ORAL_TABLET | ORAL | 0 refills | Status: DC

## 2020-12-17 MED ORDER — NICOTINE 21 MG/24HR TD PT24: 21.0000 mg | MEDICATED_PATCH | Freq: Every day | TRANSDERMAL | 0 refills | Status: DC

## 2020-12-17 NOTE — TOC Transition Note (Signed)
Transition of Care [facility]) - CM/SW Discharge Note   Patient Details  Name: NATHON STEFANSKI MRN: [id number] Date of Birth: 06-Aug-1971  Transition [facility]) CM/SW Contact:  Shade Flood, LCSW Phone Number: 12/17/2020, 12:23 PM   Clinical Narrative:     Pt stable for dc today per MD. Pt in need of neb machine at dc. Pt also needs PCP and Pulmonary follow up.  Met with pt and arranged for nebulizer from [employer] at pt request. Follow up appointments scheduled and added to pt's AVS.  There are no other TOC needs for dc.  Final next level of care: Home/Self Care Barriers to Discharge: Barriers Resolved   Patient Goals and CMS Choice Patient states their goals for this hospitalization and ongoing recovery are:: go home CMS Medicare.gov Compare Post Acute Care list provided to:: Patient Choice offered to / list presented to : Patient  Discharge Placement                       Discharge Plan and Services                DME Arranged: Nebulizer machine   Date DME Agency Contacted: 12/17/20   Representative spoke with at DME Agency: Barbaraann Rondo            Social Determinants of Health ([place]) Interventions     Readmission Risk Interventions No flowsheet data found.

## 2020-12-17 NOTE — Discharge Summary (Signed)
Physician Discharge Summary [facility]    Patient: Jose Little                   Admit date: 12/15/2020   DOB: [birth year]             Discharge date:12/17/2020/11:43 AM SHF:[id number]                          PCP: Patient, No Pcp Per  Disposition: HOME   Recommendations for Outpatient Follow-up:   . Follow up: in 1 week  Discharge Condition: Stable   Code Status:   Code Status: Full Code  Diet recommendation: Regular healthy diet   Discharge Diagnoses:    Principal Problem:   COPD with acute exacerbation (Jose Little) Active Problems:   Respiratory failure with hypoxia ([place])   Tobacco abuse   History of Present Illness/ Hospital Course Jose Little Summary:    Jose Little a49 y.o.male,with history of brain tumor, and rotator cuff tear presents to the ED with a chief complaint offever and chest pressure.   For past 2 days, associate with shortness of breath.  Subjective fever at home ?104.  He is taken 3 Covid test at home which have all been negative.  Patient denies history of asthma or COPD, not O2 dependent. He is a current smoker smoking 1 pack/day.   ED Temp 98.9, heart rate 71-94, RR17-22, BP 163/93 WBC 10.2, hemoglobin 14.5 Chemistry panel is mostly unremarkable with a slight bump in creatinine at 1.16 Negative COVID Chest x-ray shows no acute abnormality EKG is without ischemic changes, sinus rhythm, rate 89 Patient was given Combivent inhaler, DuoNeb, Solu-Medrol. He continued to wheeze so he was then put on a continuous albuterol neb. Patient oxygen sats dropped as low as 88% per ED provider, but have improved with current treatments. Admission requested for further breathing treatments and management     Acute respiratory failure with hypoxia -bronchitis 1. Still complain of shortness of breath with exertion, but has been successfully weaned off supplemental oxygen ... With exertion only shortness of breath but did not desat maintain  his O2 sat greater 92% 2. On well to DuoNeb bronchodilator treatments nebs IV steroids 3. Audible wheezing and rhonchi-- 4. CXR with no acute abnormality  5. Influenza A/B, SARS-CoV-2 negative 6. Reported O2 sats down to 80s on room air 7. Likely bronchitis with COPD exacerbation -continue steroids and p.o. antibiotics 8. Albuterol continuous, combivent, DuoNeb, solumedrol given in ED 9. Continue solumedrol, albuterol PRN, Duoneb scheduled 10. We will continue p.o. zithromax -patient reports fever at home 11. Prescription for nebulizer machine, DuoNeb, Pulmicort, tapered down prednisone, Medrol Dosepak, azithromycin was given  Patient instructed to abstain from smoking, follow-up with PCP, recommending to establish with pulmonologist.    Chest pain-pleuritic  -As needed analgesics --chest pain has improved -Pleuritic chest pain due to persistent cough, shortness of breath -Troponins negative x2 -EKG reviewed negative any ischemic changes -As needed analgesics   COPD exacerbation   Status improved, off on oxygen now Continue management as above, IV steroids, inhalers, DuoNeb bronchodilators Patient will be referred to pulmonology as an outpatient Strongly recommended to succeed from tobacco use and abuse Inhalers prescribed    Hypertension  -Blood pressure remained mildly elevated currently 144/83 -Recommending oral medication-be exacerbated by COPD exacerbation, steroids,   tobacco abuse 1. Counseled on the importance of cessation and the progressive nature of COPD 2. Nicotine patch -was provided in hospital, prescription given  Cultures; Sputum Culture >> NGTD  Antimicrobials: Azithromycin  Code Status:   Code Status: Full Code  Family Communication: Discussed with patient and his wife at bedside The above findings and plan of care has been discussed with patient (his wife)  in detail,  they expressed understanding and agreement of above. -Advance  care planning has been discussed.   Admission status:  Status is: Inpatient  Patient meets the inpatient criteria requiring scheduled breathing treatments IV steroids supplemental oxygen  Dispo: The patient is from: Home  Anticipated d/c is to: Home      Discharge Instructions:   Discharge Instructions    Activity as tolerated - No restrictions   Complete by: As directed    Diet - low sodium heart healthy   Complete by: As directed    Discharge instructions   Complete by: As directed    Follow with PCP, and recommending establishing with pulmonologist   Increase activity slowly   Complete by: As directed        Medication List    STOP taking these medications   dexamethasone 4 MG tablet Commonly known as: DECADRON   hydrOXYzine 25 MG capsule Commonly known as: Vistaril   oxyCODONE-acetaminophen 5-325 MG tablet Commonly known as: PERCOCET/ROXICET   penicillin v potassium 500 MG tablet Commonly known as: VEETID     TAKE these medications   acetaminophen 325 MG tablet Commonly known as: TYLENOL Take 2 tablets (650 mg total) by mouth every 6 (six) hours as needed for mild pain (or Fever >/= 101).   azithromycin 500 MG tablet Commonly known as: Zithromax Take 1 tablet (500 mg total) by mouth daily for 3 days. Take 1 tablet daily for 3 days.   guaiFENesin-dextromethorphan 100-10 MG/5ML syrup Commonly known as: ROBITUSSIN DM Take 10 mLs by mouth every 8 (eight) hours for 10 days.   ipratropium-albuterol 0.5-2.5 (3) MG/3ML Soln Commonly known as: DUONEB Take 3 mLs by nebulization every 6 (six) hours as needed.   methylPREDNISolone 4 MG Tbpk tablet Commonly known as: MEDROL DOSEPAK Medrol Dosepak take as instructed   mometasone-formoterol 200-5 MCG/ACT Aero Commonly known as: DULERA Inhale 2 puffs into the lungs 2 (two) times daily.   nicotine 21 mg/24hr patch Commonly known as: NICODERM CQ - dosed in mg/24 hours Place 1 patch (21 mg  total) onto the skin daily. Start taking on: December 18, 2020   triamterene-hydrochlorothiazide 37.5-25 MG capsule Commonly known as: Dyazide Take 1 each (1 capsule total) by mouth daily.       No Known Allergies   Procedures /Studies:   DG Chest Portable 1 View  Result Date: 12/15/2020 CLINICAL DATA:  Cough, fever, chest pain and congestion EXAM: PORTABLE CHEST 1 VIEW COMPARISON:  Radiograph 07/29/2019 FINDINGS: No consolidation, features of edema, pneumothorax, or effusion. Pulmonary vascularity is normally distributed. The cardiomediastinal contours are unremarkable. No acute osseous or soft tissue abnormality. Degenerative changes are present in the imaged spine and shoulders. Telemetry leads overlie the chest. Metallic nipple ring projects over the right chest wall. IMPRESSION: No acute cardiopulmonary abnormality. Electronically Signed   By: Lovena Le M.D.   On: 12/15/2020 19:14    Subjective:   Patient was seen and examined 12/17/2020, 11:43 AM Patient stable today. No acute distress.  No issues overnight Stable for discharge.  Discharge Exam:    Vitals:   12/17/20 0545 12/17/20 0822 12/17/20 0826 12/17/20 0833  BP: (!) 144/83     Pulse: 66     Resp: 16  Temp: (!) 97 F (36.1 C)     TempSrc:      SpO2: 93% 95% 99% 98%  Weight:      Height:        General: Pt lying comfortably in bed & appears in no obvious distress. Cardiovascular: S1 & S2 heard, RRR, S1/S2 +. No murmurs, rubs, gallops or clicks. No JVD or pedal edema. Respiratory: Clear to auscultation without wheezing, rhonchi or crackles. No increased work of breathing. Abdominal:  Non-distended, non-tender & soft. No organomegaly or masses appreciated. Normal bowel sounds heard. CNS: Alert and oriented. No focal deficits. Extremities: no edema, no cyanosis      The results of significant diagnostics from this hospitalization (including imaging, microbiology, ancillary and laboratory) are listed below for  reference.      Microbiology:   Recent Results (from the past 240 hour(s))  Resp Panel by RT-PCR (Flu A&B, Covid) Nasopharyngeal Swab     Status: None   Collection Time: 12/15/20  7:16 PM   Specimen: Nasopharyngeal Swab; Nasopharyngeal(NP) swabs in vial transport medium  Result Value Ref Range Status   SARS Coronavirus 2 by RT PCR NEGATIVE NEGATIVE Final    Comment: (NOTE) SARS-CoV-2 target nucleic acids are NOT DETECTED.  The SARS-CoV-2 RNA is generally detectable in upper respiratory specimens during the acute phase of infection. The lowest concentration of SARS-CoV-2 viral copies this assay can detect is 138 copies/mL. A negative result does not preclude SARS-Cov-2 infection and should not be used as the sole basis for treatment or other patient management decisions. A negative result may occur with  improper specimen collection/handling, submission of specimen other than nasopharyngeal swab, presence of viral mutation(s) within the areas targeted by this assay, and inadequate number of viral copies(<138 copies/mL). A negative result must be combined with clinical observations, patient history, and epidemiological information. The expected result is Negative.  Fact Sheet for Patients:  EntrepreneurPulse.com.au  Fact Sheet for Healthcare Providers:  IncredibleEmployment.be  This test is no t yet approved or cleared by the [place] FDA and  has been authorized for detection and/or diagnosis of SARS-CoV-2 by FDA under an Emergency Use Authorization (EUA). This EUA will remain  in effect (meaning this test can be used) for the duration of the COVID-19 declaration under Section 564(b)(1) of the Act, 21 U.S.C.section 360bbb-3(b)(1), unless the authorization is terminated  or revoked sooner.       Influenza A by PCR NEGATIVE NEGATIVE Final   Influenza B by PCR NEGATIVE NEGATIVE Final    Comment: (NOTE) The Xpert Xpress  SARS-CoV-2/FLU/RSV plus assay is intended as an aid in the diagnosis of influenza from Nasopharyngeal swab specimens and should not be used as a sole basis for treatment. Nasal washings and aspirates are unacceptable for Xpert Xpress SARS-CoV-2/FLU/RSV testing.  Fact Sheet for Patients: EntrepreneurPulse.com.au  Fact Sheet for Healthcare Providers: IncredibleEmployment.be  This test is not yet approved or cleared by the [place] FDA and has been authorized for detection and/or diagnosis of SARS-CoV-2 by FDA under an Emergency Use Authorization (EUA). This EUA will remain in effect (meaning this test can be used) for the duration of the COVID-19 declaration under Section 564(b)(1) of the Act, 21 U.S.C. section 360bbb-3(b)(1), unless the authorization is terminated or revoked.  Performed at [facility], [address], [place], [place] [address]      Labs:   CBC: Recent Labs  Lab 12/15/20 1840 12/16/20 0522  WBC 10.2 9.7  NEUTROABS 7.8*  --   HGB  14.5 13.9  HCT 43.2 43.1  MCV 88.9 90.4  PLT 243 597   Basic Metabolic Panel: Recent Labs  Lab 12/15/20 1840 12/16/20 0522  NA 140 141  K 3.8 4.1  CL 107 107  CO2 23 23  GLUCOSE 102* 154*  BUN 18 20  CREATININE 1.16 0.89  CALCIUM 8.4* 8.8*  MG  --  1.8   Liver Function Tests: Recent Labs  Lab 12/16/20 0522  AST 23  ALT 22  ALKPHOS 64  BILITOT 0.2*  PROT 6.5  ALBUMIN 3.6   BNP (last 3 results) No results for input(s): BNP in the last 8760 hours. Cardiac Enzymes: No results for input(s): CKTOTAL, CKMB, CKMBINDEX, TROPONINI in the last 168 hours. CBG: No results for input(s): GLUCAP in the last 168 hours. Hgb A1c No results for input(s): HGBA1C in the last 72 hours. Lipid Profile No results for input(s): CHOL, HDL, LDLCALC, TRIG, CHOLHDL, LDLDIRECT in the last 72 hours. Thyroid function studies No results for input(s): TSH, T4TOTAL, T3FREE, THYROIDAB in the last 72  hours.  Invalid input(s): FREET3 Anemia work up No results for input(s): VITAMINB12, FOLATE, FERRITIN, TIBC, IRON, RETICCTPCT in the last 72 hours. Urinalysis    Component Value Date/Time   COLORURINE YELLOW 07/29/2019 1201   APPEARANCEUR CLEAR 07/29/2019 1201   LABSPEC 1.019 07/29/2019 1201   PHURINE 5.0 07/29/2019 1201   GLUCOSEU NEGATIVE 07/29/2019 1201   HGBUR NEGATIVE 07/29/2019 [place] 07/29/2019 [place] 07/29/2019 1201   PROTEINUR NEGATIVE 07/29/2019 1201   UROBILINOGEN 0.2 06/24/2015 2000   NITRITE NEGATIVE 07/29/2019 1201   [place] 07/29/2019 1201         Time coordinating discharge: Over 45 minutes  SIGNED: Deatra James, MD, FACP, FHM. [facility],  Please use [email or web address] to Page If 7PM-7AM, please contact night-coverage [email or web address] 12/17/2020, 11:43 AM

## 2020-12-17 NOTE — Progress Notes (Signed)
IV removed and discharge instructions reviewed.  Scripts sent to pharmacy and family here to drive home. Transported by WC to main entrance

## 2020-12-17 NOTE — Progress Notes (Signed)
Ambulated past nurse's station to end of other hall and was 97% on room air.

## 2020-12-20 ENCOUNTER — Ambulatory Visit: Payer: BC Managed Care – PPO | Admitting: Nurse Practitioner

## 2020-12-20 ENCOUNTER — Encounter: Payer: Self-pay | Admitting: Nurse Practitioner

## 2020-12-20 ENCOUNTER — Other Ambulatory Visit: Payer: Self-pay

## 2020-12-20 VITALS — BP 169/91 | HR 77 | Temp 98.7°F | Resp 20 | Ht 69.0 in | Wt 202.0 lb

## 2020-12-20 DIAGNOSIS — Z7689 Persons encountering health services in other specified circumstances: Secondary | ICD-10-CM | POA: Diagnosis not present

## 2020-12-20 DIAGNOSIS — F17220 Nicotine dependence, chewing tobacco, uncomplicated: Secondary | ICD-10-CM | POA: Diagnosis not present

## 2020-12-20 DIAGNOSIS — Z0000 Encounter for general adult medical examination without abnormal findings: Secondary | ICD-10-CM | POA: Insufficient documentation

## 2020-12-20 DIAGNOSIS — J441 Chronic obstructive pulmonary disease with (acute) exacerbation: Secondary | ICD-10-CM | POA: Diagnosis not present

## 2020-12-20 DIAGNOSIS — F17200 Nicotine dependence, unspecified, uncomplicated: Secondary | ICD-10-CM | POA: Insufficient documentation

## 2020-12-20 MED ORDER — ALBUTEROL SULFATE HFA 108 (90 BASE) MCG/ACT IN AERS: 2.0000 | INHALATION_SPRAY | Freq: Four times a day (QID) | RESPIRATORY_TRACT | 0 refills | Status: DC | PRN

## 2020-12-20 NOTE — Assessment & Plan Note (Signed)
-  still has some wheezing and SOB with exertion -has 60 pk-yr history with cigarettes; stopped smoking during hospitalization -he is using dulera for maintenance therapy -he is finishing medrol dose pack and z-pack -using duonebs PRN -Rx. Albuterol inhaler

## 2020-12-20 NOTE — Progress Notes (Signed)
New Patient Office Visit  Subjective:  Patient ID: Jose Little, male    DOB: 19-Oct-1970  Age: 50 y.o. MRN: [id number]  CC:  Chief Complaint  Patient presents with  . New Patient (Initial Visit)    HPI Jose Little presents for new patient visit. No recent PCP  History of brain tumor and he had surgery for that via gamma knife around 2006 and left rotator cuff tear   He was recently discharged from [facility] for COPD exacerbation.  At time of discharge he had SOB with exertion, but was not requiring supplemental O2.  He was discharged with a z-pack for abx coverage as well as dulera, medrol dosepack, and duonebs via nebulizer.  He was recommended to f/u with pulmonology as an outpatient, and has upcoming appointment with Dr. Halford Chessman on 01/10/21.  He was 2 ppd smoker for about 30 years. Stopped during hospitalization.  Past Medical History:  Diagnosis Date  . Brain tumor ([place])   . Rotator cuff tear     Past Surgical History:  Procedure Laterality Date  . ABDOMINAL SURGERY    . BRAIN SURGERY    . CHOLECYSTECTOMY    . ROTATOR CUFF REPAIR    . SINUS SURGERY WITH INSTATRAK      Family History  Problem Relation Age of Onset  . Cancer Father   . Heart failure Other     Social History   Socioeconomic History  . Marital status: Married    Spouse name: Not on file  . Number of children: Not on file  . Years of education: Not on file  . Highest education level: Not on file  Occupational History  . 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 Smoking status: Former Smoker    Packs/day: 0.50    Years: 12.00    Pack years: 6.00    Types: Cigarettes    Quit date: 12/15/2020    Years since quitting: 0.0  . Smokeless tobacco: Current User    Types: Chew  Substance and Sexual Activity  . Alcohol use: No  . Drug use: No  . Sexual activity: Yes  Other Topics Concern  . Not on file  Social History Narrative  . Not on file   Social Determinants  of Health   Financial Resource Strain: Not on file  Food Insecurity: Not on file  Transportation Needs: Not on file  Physical Activity: Not on file  Stress: Not on file  Social Connections: Not on file  Intimate Partner Violence: Not on file    ROS Review of Systems  Constitutional: Negative.   Respiratory: Positive for shortness of breath and wheezing.   Cardiovascular: Negative.   Psychiatric/Behavioral: Negative.     Objective:   Today's Vitals: BP (!) 169/91   Pulse 77   Temp 98.7 F (37.1 C)   Resp 20   Ht 5\' 9"  (1.753 m)   Wt 202 lb (91.6 kg)   SpO2 93%   BMI 29.83 kg/m   Physical Exam Constitutional:      Appearance: Normal appearance.  Cardiovascular:     Rate and Rhythm: Normal rate and regular rhythm.     Pulses: Normal pulses.     Heart sounds: Normal heart sounds.  Pulmonary:     Effort: No respiratory distress.     Breath sounds: Wheezing and rhonchi present.  Neurological:     Mental Status: He is alert.  Psychiatric:  Mood and Affect: Mood normal.        Behavior: Behavior normal.        Thought Content: Thought content normal.        Judgment: Judgment normal.     Assessment & Plan:   Problem List Items Addressed This Visit      Respiratory   COPD with acute exacerbation ([address])    -still has some wheezing and SOB with exertion -has 60 pk-yr history with cigarettes; stopped smoking during hospitalization -he is using dulera for maintenance therapy -he is finishing medrol dose pack and z-pack -using duonebs PRN -Rx. Albuterol inhaler      Relevant Medications   albuterol (VENTOLIN HFA) 108 (90 Base) MCG/ACT inhaler   Other Relevant Orders   Basic Metabolic Panel (BMET)   CBC with Differential/Platelet     Other   Nicotine dependence    -stopped smoking this week -still chews tobacco, not interested in stopping today      Encounter to establish care - Primary    -no recent PCP -reviewed hospital records          Outpatient Encounter Medications as of 12/20/2020  Medication Sig  . acetaminophen (TYLENOL) 325 MG tablet Take 2 tablets (650 mg total) by mouth every 6 (six) hours as needed for mild pain (or Fever >/= 101).  Marland Kitchen albuterol (VENTOLIN HFA) 108 (90 Base) MCG/ACT inhaler Inhale 2 puffs into the lungs every 6 (six) hours as needed for wheezing or shortness of breath.  Marland Kitchen azithromycin (ZITHROMAX) 500 MG tablet Take 1 tablet (500 mg total) by mouth daily for 3 days. Take 1 tablet daily for 3 days.  Marland Kitchen ipratropium-albuterol (DUONEB) 0.5-2.5 (3) MG/3ML SOLN Take 3 mLs by nebulization every 6 (six) hours as needed.  . methylPREDNISolone (MEDROL DOSEPAK) 4 MG TBPK tablet Medrol Dosepak take as instructed  . mometasone-formoterol (DULERA) 200-5 MCG/ACT AERO Inhale 2 puffs into the lungs 2 (two) times daily.  . nicotine (NICODERM CQ - DOSED IN MG/24 HOURS) 21 mg/24hr patch Place 1 patch (21 mg total) onto the skin daily.  Marland Kitchen triamterene-hydrochlorothiazide (DYAZIDE) 37.5-25 MG capsule Take 1 each (1 capsule total) by mouth daily.  Marland Kitchen guaiFENesin-dextromethorphan (ROBITUSSIN DM) 100-10 MG/5ML syrup Take 10 mLs by mouth every 8 (eight) hours for 10 days. (Patient not taking: Reported on 12/20/2020)   No facility-administered encounter medications on file as of 12/20/2020.    Follow-up: Return in about 1 month (around 01/20/2021) for Physical (please come to appointment fasting).   Noreene Larsson, NP

## 2020-12-20 NOTE — Assessment & Plan Note (Signed)
-  no recent PCP -reviewed hospital records

## 2020-12-20 NOTE — Assessment & Plan Note (Addendum)
-  stopped smoking this week -still chews tobacco, not interested in stopping today

## 2020-12-21 LAB — CBC WITH DIFFERENTIAL/PLATELET
Basophils Absolute: 0.1 10*3/uL (ref 0.0–0.2)
Basos: 1 %
EOS (ABSOLUTE): 0.2 10*3/uL (ref 0.0–0.4)
Eos: 1 %
Hematocrit: 45.3 % (ref 37.5–51.0)
Hemoglobin: 15.4 g/dL (ref 13.0–17.7)
Immature Grans (Abs): 0.7 10*3/uL — ABNORMAL HIGH (ref 0.0–0.1)
Immature Granulocytes: 5 %
Lymphocytes Absolute: 3.1 10*3/uL (ref 0.7–3.1)
Lymphs: 25 %
MCH: 29.1 pg (ref 26.6–33.0)
MCHC: 34 g/dL (ref 31.5–35.7)
MCV: 86 fL (ref 79–97)
Monocytes Absolute: 1.1 10*3/uL — ABNORMAL HIGH (ref 0.1–0.9)
Monocytes: 9 %
Neutrophils Absolute: 7.4 10*3/uL — ABNORMAL HIGH (ref 1.4–7.0)
Neutrophils: 59 %
Platelets: 321 10*3/uL (ref 150–450)
RBC: 5.3 x10E6/uL (ref 4.14–5.80)
RDW: 13.1 % (ref 11.6–15.4)
WBC: 12.5 10*3/uL — ABNORMAL HIGH (ref 3.4–10.8)

## 2020-12-21 LAB — BASIC METABOLIC PANEL
BUN/Creatinine Ratio: 22 — ABNORMAL HIGH (ref 9–20)
BUN: 25 mg/dL — ABNORMAL HIGH (ref 6–24)
CO2: 24 mmol/L (ref 20–29)
Calcium: 9.2 mg/dL (ref 8.7–10.2)
Chloride: 103 mmol/L (ref 96–106)
Creatinine, Ser: 1.15 mg/dL (ref 0.76–1.27)
Glucose: 73 mg/dL (ref 65–99)
Potassium: 4.5 mmol/L (ref 3.5–5.2)
Sodium: 143 mmol/L (ref 134–144)
eGFR: 79 mL/min/{1.73_m2} (ref >59)

## 2020-12-23 ENCOUNTER — Telehealth: Payer: Self-pay

## 2020-12-23 NOTE — Telephone Encounter (Signed)
Pt states that since he started the blood pressure medication he is having a lot of cramps all over and wanted to know if this could be a side effect?

## 2020-12-23 NOTE — Telephone Encounter (Signed)
The albuterol can increase potassium temporarily, so that may be what is causing the cramping. Try drinking more water.

## 2020-12-23 NOTE — Progress Notes (Signed)
HIs WBC count was a little elevated, but he had taken steroids recently, so that is expected. Otherwise his labs are good.

## 2020-12-24 NOTE — Telephone Encounter (Signed)
Pt informed

## 2021-01-10 ENCOUNTER — Encounter: Payer: Self-pay | Admitting: Pulmonary Disease

## 2021-01-10 ENCOUNTER — Ambulatory Visit: Payer: BC Managed Care – PPO | Admitting: Pulmonary Disease

## 2021-01-10 ENCOUNTER — Other Ambulatory Visit: Payer: Self-pay

## 2021-01-10 VITALS — BP 158/84 | HR 70 | Temp 97.1°F | Ht 69.0 in | Wt 210.8 lb

## 2021-01-10 DIAGNOSIS — R059 Cough, unspecified: Secondary | ICD-10-CM

## 2021-01-10 DIAGNOSIS — R0683 Snoring: Secondary | ICD-10-CM | POA: Diagnosis not present

## 2021-01-10 NOTE — Patient Instructions (Signed)
You can stop using dulera  You can use albuterol every 6 hours as needed for cough, wheezing, chest congestion, or shortness of breath  Will arrange for home sleep study and pulmonary function test  Will call to arrange for follow up after sleep study reviewed

## 2021-01-10 NOTE — Progress Notes (Signed)
[facility]  Chief Complaint  Patient presents with  . Consult    If outside and get's real hot feel short of breath    Constitutional:  BP (!) 158/84 (BP Location: Left Arm, Cuff Size: Normal)   Pulse 70   Temp (!) 97.1 F (36.2 C) (Other (Comment)) Comment (Src): wrist  Ht 5\' 9"  (1.753 m)   Wt 210 lb 12.8 oz (95.6 kg)   SpO2 100% Comment: Room air  BMI 31.13 kg/m   Past Medical History:  Brain tumor  Past Surgical History:  He  has a past surgical history that includes Sinus surgery with [employer]atrak; Cholecystectomy; Rotator cuff repair; Brain surgery; and Abdominal surgery.  Brief Summary:  NYKOLAS BACALLAO is a 50 y.o. male former smoker with cough, snoring and nocturnal hypoxia.      Subjective:   He was in hospital earlier this month for cough and fever.  He smoked cigarettes up to the time of admission.  He was told he had low oxygen at night, and was told he might have COPD.  Chest xray from 12/15/20 was normal.  He was told he has COPD, but not clear how this was determined.  He was sent home with dulera and albuterol.  Ruthe Mannan seemed to help when he first got home, but doesn't seem to be doing anything now.  He hasn't used albuterol since he got home.  His cough is much improved.  He is not having wheeze, chest congestion, or phlegm.  He doesn't feel like his breathing inhibits his activity level.  He snores and has trouble sleeping on his back.  He is a restless sleeper, and has trouble staying awake when he is sitting quiet.  He gets headaches in the morning.  He is from [place].  He works as an Clinical biochemist.  Had pneumonia 25 yrs ago.  No history of TB.  He was never told he had asthma before.  Both his parents have COPD.  He started smoking at age 89 and smoked 2 packs per day.  Physical Exam:   Appearance - well kempt   ENMT - no sinus tenderness, no oral exudate, no LAN, Mallampati 4 airway, no stridor, raspy  voice  Respiratory - equal breath sounds bilaterally, no wheezing or rales  CV - s1s2 regular rate and rhythm, no murmurs  Ext - no clubbing, no edema  Skin - no rashes  Psych - normal mood and affect   Pulmonary testing:    Chest Imaging:    Sleep Tests:    Cardiac Tests:    Social History:  He  reports that he quit smoking about 3 weeks ago. His smoking use included cigarettes. He started smoking about 33 years ago. He has a 86.00 pack-year smoking history. His smokeless tobacco use includes chew. He reports that he does not drink alcohol and does not use drugs.  Family History:  His family history includes COPD in his father and mother; Cancer in his father; Heart failure in an other family member.    Discussion:  He had recent febrile respiratory infection associated with cough, dyspnea, and wheeze.  He has since stopped smoking and respiratory symptoms improved.  It is uncertain whether he has COPD, asthma or this episode was an acute asthmatic bronchitis that has since resolved.  He also has snoring, sleep disruption, apnea, and daytime sleepiness.  He has history of hypertension.  He was noted to have nocturnal hypoxemia while  in hospital.  It is possible he could have obstructive sleep apnea.   Assessment/Plan:   Cough with history of tobacco abuse. - he can stop dulera - he can continue albuterol prn - will arrange for pulmonary function test to further assess whether he has obstructive lung disease - emphasize the need for him to remain off of cigarettes  Snoring with excessive daytime sleepiness. - will need to arrange for a home sleep study  Obesity. - discussed how weight can impact sleep and risk for sleep disordered breathing - discussed options to assist with weight loss: combination of diet modification, cardiovascular and strength training exercises  Cardiovascular risk. - had an extensive discussion regarding the adverse health consequences  related to untreated sleep disordered breathing - specifically discussed the risks for hypertension, coronary artery disease, cardiac dysrhythmias, cerebrovascular disease, and diabetes - lifestyle modification discussed  Safe driving practices. - discussed how sleep disruption can increase risk of accidents, particularly when driving - safe driving practices were discussed  Therapies for obstructive sleep apnea. - if the sleep study shows significant sleep apnea, then various therapies for treatment were reviewed: CPAP, oral appliance, and surgical interventions   Time Spent Involved in Patient Care on Day of Examination:  33 minutes  Follow up:  Patient Instructions  You can stop using dulera  You can use albuterol every 6 hours as needed for cough, wheezing, chest congestion, or shortness of breath  Will arrange for home sleep study and pulmonary function test  Will call to arrange for follow up after sleep study reviewed    Medication List:   Allergies as of 01/10/2021   No Known Allergies     Medication List       Accurate as of January 10, 2021  9:58 AM. If you have any questions, ask your nurse or doctor.        STOP taking these medications   methylPREDNISolone 4 MG Tbpk tablet Commonly known as: MEDROL DOSEPAK Stopped by: Chesley Mires, MD   mometasone-formoterol 200-5 MCG/ACT Aero Commonly known as: DULERA Stopped by: Chesley Mires, MD     TAKE these medications   acetaminophen 325 MG tablet Commonly known as: TYLENOL Take 2 tablets (650 mg total) by mouth every 6 (six) hours as needed for mild pain (or Fever >/= 101).   albuterol 108 (90 Base) MCG/ACT inhaler Commonly known as: VENTOLIN HFA Inhale 2 puffs into the lungs every 6 (six) hours as needed for wheezing or shortness of breath.   ipratropium-albuterol 0.5-2.5 (3) MG/3ML Soln Commonly known as: DUONEB Take 3 mLs by nebulization every 6 (six) hours as needed.   nicotine 21 mg/24hr patch Commonly  known as: NICODERM CQ - dosed in mg/24 hours Place 1 patch (21 mg total) onto the skin daily.   triamterene-hydrochlorothiazide 37.5-25 MG capsule Commonly known as: Dyazide Take 1 each (1 capsule total) by mouth daily.       Signature:  Chesley Mires, MD [place] Pager - [phone] 01/10/2021, 9:58 AM

## 2021-01-31 ENCOUNTER — Encounter: Payer: BC Managed Care – PPO | Admitting: Nurse Practitioner

## 2021-02-13 ENCOUNTER — Ambulatory Visit: Payer: BC Managed Care – PPO

## 2021-02-13 ENCOUNTER — Other Ambulatory Visit: Payer: Self-pay

## 2021-02-13 DIAGNOSIS — R0683 Snoring: Secondary | ICD-10-CM

## 2021-02-13 DIAGNOSIS — G4733 Obstructive sleep apnea (adult) (pediatric): Secondary | ICD-10-CM | POA: Diagnosis not present

## 2021-02-14 ENCOUNTER — Ambulatory Visit (INDEPENDENT_AMBULATORY_CARE_PROVIDER_SITE_OTHER): Payer: BC Managed Care – PPO | Admitting: Nurse Practitioner

## 2021-02-14 ENCOUNTER — Encounter: Payer: Self-pay | Admitting: Nurse Practitioner

## 2021-02-14 VITALS — BP 145/80 | HR 57 | Temp 98.6°F | Resp 18 | Ht 69.0 in | Wt 217.0 lb

## 2021-02-14 DIAGNOSIS — Z1211 Encounter for screening for malignant neoplasm of colon: Secondary | ICD-10-CM

## 2021-02-14 DIAGNOSIS — Z0000 Encounter for general adult medical examination without abnormal findings: Secondary | ICD-10-CM | POA: Diagnosis not present

## 2021-02-14 DIAGNOSIS — R1907 Generalized intra-abdominal and pelvic swelling, mass and lump: Secondary | ICD-10-CM | POA: Diagnosis not present

## 2021-02-14 DIAGNOSIS — Z139 Encounter for screening, unspecified: Secondary | ICD-10-CM

## 2021-02-14 DIAGNOSIS — Z0001 Encounter for general adult medical examination with abnormal findings: Secondary | ICD-10-CM | POA: Diagnosis not present

## 2021-02-14 DIAGNOSIS — J441 Chronic obstructive pulmonary disease with (acute) exacerbation: Secondary | ICD-10-CM

## 2021-02-14 DIAGNOSIS — S29012A Strain of muscle and tendon of back wall of thorax, initial encounter: Secondary | ICD-10-CM | POA: Insufficient documentation

## 2021-02-14 DIAGNOSIS — R1900 Intra-abdominal and pelvic swelling, mass and lump, unspecified site: Secondary | ICD-10-CM | POA: Insufficient documentation

## 2021-02-14 MED ORDER — IBUPROFEN 600 MG PO TABS: 600.0000 mg | ORAL_TABLET | Freq: Three times a day (TID) | ORAL | 0 refills | Status: DC | PRN

## 2021-02-14 MED ORDER — PREDNISONE 10 MG PO TABS: ORAL_TABLET | ORAL | 0 refills | Status: AC

## 2021-02-14 MED ORDER — TIZANIDINE HCL 4 MG PO TABS: 4.0000 mg | ORAL_TABLET | Freq: Four times a day (QID) | ORAL | 0 refills | Status: DC | PRN

## 2021-02-14 NOTE — Assessment & Plan Note (Signed)
-  will set up for colonoscopy; brother recently had colonoscopy and had 12 lesions removed; unsure if CA present or not

## 2021-02-14 NOTE — Assessment & Plan Note (Signed)
-  pain to midback at t-spine level -no know MOI -will treat for muscle strain -Rx. Tizanidine, ibuprofen, and prednisone -if no improvement will consider ortho consult

## 2021-02-14 NOTE — Assessment & Plan Note (Signed)
-  has multiple abdominal masses- feel superficial like lipomas, but there are so numerous that it is concerning -he states that dermatology had surgically removed similar masses previously, but he has more today -briefly reviewed records, but I don't see any previous notes of this -given his brother's colonoscopy results, will attempt PA for multiple palpable abdominal masses

## 2021-02-14 NOTE — Patient Instructions (Signed)
We will get fasting labs today.  For next appointment, please have fasting labs drawn 2-3 days prior to your appointment so we can discuss the results during your office visit.

## 2021-02-14 NOTE — Progress Notes (Signed)
Established Patient Office Visit  Subjective:  Patient ID: Jose Little, male    DOB: 1971/09/01  Age: 50 y.o. MRN: [id number]  CC:  Chief Complaint  Patient presents with  . Annual Exam    HPI Jose Little presents for physical exam.  He states that he has back pain.  He rates his pain at 7/10. It started 2 days ago at work.  No mechanism of injury, he states he thinks it was his bed and how he sleeps. He hasn't taken any OTC meds for it.  He has hx of HTN, but he hasn't taken his BP meds this AM and his BP is slightly elevated.  He states his brother just had 12 lesions removed form his colon, and he was told that he should tell his doctor to get a colonoscopy.   Past Medical History:  Diagnosis Date  . Brain tumor ([place])   . KNEE, ARTHRITIS, 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   Qualifier: Diagnosis of  By: Aline Brochure MD, Dorothyann Peng    . Rotator cuff tear     Past Surgical History:  Procedure Laterality Date  . ABDOMINAL SURGERY    . BRAIN SURGERY    . CHOLECYSTECTOMY    . ROTATOR CUFF REPAIR    . SINUS SURGERY WITH INSTATRAK      Family History  Problem Relation Age of Onset  . Cancer Father   . COPD Father   . Heart failure Other   . COPD Mother     Social History   Socioeconomic History  . Marital status: Married    Spouse name: Not on file  . Number of children: Not on file  . Years of education: Not on file  . Highest education level: Not on file  Occupational History  . 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 Smoking status: Former Smoker    Packs/day: 2.00    Years: 43.00    Pack years: 86.00    Types: Cigarettes    Start date: 01/11/1988    Quit date: 12/15/2020    Years since quitting: 0.1  . Smokeless tobacco: Current User    Types: Chew  Vaping Use  . Vaping Use: Never used  Substance and Sexual Activity  . Alcohol use: No  . Drug use: No  . Sexual activity: Yes  Other Topics Concern  . Not on file  Social  History Narrative  . Not on file   Social Determinants of Health   Financial Resource Strain: Not on file  Food Insecurity: Not on file  Transportation Needs: Not on file  Physical Activity: Not on file  Stress: Not on file  Social Connections: Not on file  Intimate Partner Violence: Not on file    Outpatient Medications Prior to Visit  Medication Sig Dispense Refill  . albuterol (VENTOLIN HFA) 108 (90 Base) MCG/ACT inhaler Inhale 2 puffs into the lungs every 6 (six) hours as needed for wheezing or shortness of breath. 8 g 0  . ipratropium-albuterol (DUONEB) 0.5-2.5 (3) MG/3ML SOLN Take 3 mLs by nebulization every 6 (six) hours as needed. 360 mL 3  . nicotine (NICODERM CQ - DOSED IN MG/24 HOURS) 21 mg/24hr patch Place 1 patch (21 mg total) onto the skin daily. 28 patch 0  . triamterene-hydrochlorothiazide (DYAZIDE) 37.5-25 MG capsule Take 1 each (1 capsule total) by mouth daily. 30 capsule 2   No facility-administered medications prior to visit.    No Known Allergies  ROS Review  of Systems  Constitutional: Negative.   HENT: Negative.        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   Respiratory: Negative.        Doing well with albuterol  Cardiovascular: Negative.   Gastrointestinal: Negative.   Endocrine: Negative.   Genitourinary: Negative.   Musculoskeletal: Negative.   Skin: Negative.   Allergic/Immunologic: Negative.   Neurological: Negative.   Hematological: Negative.   Psychiatric/Behavioral: Negative.       Objective:    Physical Exam Constitutional:      Appearance: Normal appearance.  HENT:     Head: Normocephalic and atraumatic.     Right Ear: Tympanic membrane, ear canal and external ear normal.     Left Ear: Tympanic membrane, ear canal and external ear normal.     Nose: Nose normal.     Mouth/Throat:     Mouth: Mucous membranes are moist.     Pharynx: Oropharynx is clear.  Eyes:     Extraocular Movements: Extraocular  movements intact.     Conjunctiva/sclera: Conjunctivae normal.     Pupils: Pupils are equal, round, and reactive to light.  Cardiovascular:     Rate and Rhythm: Normal rate and regular rhythm.     Pulses: Normal pulses.     Heart sounds: Normal heart sounds.  Pulmonary:     Effort: Pulmonary effort is normal.     Breath sounds: Normal breath sounds.  Abdominal:     General: Abdomen is flat. Bowel sounds are normal. There is no distension.     Palpations: Abdomen is soft. There is mass.     Tenderness: There is abdominal tenderness. There is rebound. There is no guarding.     Hernia: A hernia is present.     Comments: Multiple palpable masses to abdomen  Musculoskeletal:        General: Normal range of motion.     Cervical back: Normal range of motion and neck supple.  Skin:    General: Skin is warm and dry.     Capillary Refill: Capillary refill takes less than 2 seconds.  Neurological:     General: No focal deficit present.     Mental Status: He is alert and oriented to person, place, and time.     Cranial Nerves: No cranial nerve deficit.     Sensory: No sensory deficit.     Motor: No weakness.     Coordination: Coordination normal.     Gait: Gait normal.  Psychiatric:        Mood and Affect: Mood normal.        Behavior: Behavior normal.        Thought Content: Thought content normal.        Judgment: Judgment normal.     BP (!) 145/80   Pulse (!) 57   Temp 98.6 F (37 C)   Resp 18   Ht 5' 9"  (1.753 m)   Wt 217 lb (98.4 kg)   SpO2 96%   BMI 32.05 kg/m  Wt Readings from Last 3 Encounters:  02/14/21 217 lb (98.4 kg)  01/10/21 210 lb 12.8 oz (95.6 kg)  12/20/20 202 lb (91.6 kg)     Health Maintenance Due  Topic Date Due  . Hepatitis C Screening  Never done  . COLONOSCOPY (Pts 45-36yr Insurance coverage will need to be confirmed)  Never done    There are no preventive care reminders to display for this patient.  No results found for: TSH Lab Results   Component Value Date   WBC 12.5 (H) 12/20/2020   HGB 15.4 12/20/2020   HCT 45.3 12/20/2020   MCV 86 12/20/2020   PLT 321 12/20/2020   Lab Results  Component Value Date   NA 143 12/20/2020   K 4.5 12/20/2020   CO2 24 12/20/2020   GLUCOSE 73 12/20/2020   BUN 25 (H) 12/20/2020   CREATININE 1.15 12/20/2020   BILITOT 0.2 (L) 12/16/2020   ALKPHOS 64 12/16/2020   AST 23 12/16/2020   ALT 22 12/16/2020   PROT 6.5 12/16/2020   ALBUMIN 3.6 12/16/2020   CALCIUM 9.2 12/20/2020   ANIONGAP 11 12/16/2020   EGFR 79 12/20/2020   No results found for: CHOL No results found for: HDL No results found for: LDLCALC No results found for: TRIG No results found for: CHOLHDL No results found for: HGBA1C    Assessment & Plan:   Problem List Items Addressed This Visit      Respiratory   COPD with acute exacerbation ([place])    -followedby pulmonology -they stopped his dulera -Continue PRN albuterol and duonebs; doing well      Relevant Medications   predniSONE (DELTASONE) 10 MG tablet     Musculoskeletal and Integument   Muscle strain of upper back    -pain to midback at t-spine level -no know MOI -will treat for muscle strain -Rx. Tizanidine, ibuprofen, and prednisone -if no improvement will consider ortho consult      Relevant Medications   ibuprofen (ADVIL) 600 MG tablet   tiZANidine (ZANAFLEX) 4 MG tablet   predniSONE (DELTASONE) 10 MG tablet     Other   Routine medical exam    -physical exam today      Screening due - Primary    -will set up for colonoscopy; brother recently had colonoscopy and had 12 lesions removed; unsure if CA present or not      Relevant Orders   Hepatitis C Antibody   Abdominal mass    -has multiple abdominal masses- feel superficial like lipomas, but there are so numerous that it is concerning -he states that dermatology had surgically removed similar masses previously, but he has more today -briefly reviewed records, but I don't see any  previous notes of this -given his brother's colonoscopy results, will attempt PA for multiple palpable abdominal masses      Relevant Orders   CT ABDOMEN PELVIS W WO CONTRAST    Other Visit Diagnoses    Well adult exam       Relevant Orders   CBC with Differential/Platelet   CMP14+EGFR   Lipid Panel With LDL/HDL Ratio   Hepatitis C Antibody   Colon cancer screening       Relevant Orders   Ambulatory referral to Gastroenterology      Meds ordered this encounter  Medications  . ibuprofen (ADVIL) 600 MG tablet    Sig: Take 1 tablet (600 mg total) by mouth every 8 (eight) hours as needed for headache, mild pain or moderate pain.    Dispense:  30 tablet    Refill:  0  . tiZANidine (ZANAFLEX) 4 MG tablet    Sig: Take 1 tablet (4 mg total) by mouth every 6 (six) hours as needed for muscle spasms.    Dispense:  30 tablet    Refill:  0  . predniSONE (DELTASONE) 10 MG tablet    Sig: Take 6 tablets (60 mg total) by mouth daily with  breakfast for 1 day, THEN 5 tablets (50 mg total) daily with breakfast for 1 day, THEN 4 tablets (40 mg total) daily with breakfast for 1 day, THEN 3 tablets (30 mg total) daily with breakfast for 1 day, THEN 2 tablets (20 mg total) daily with breakfast for 1 day, THEN 1 tablet (10 mg total) daily with breakfast for 1 day.    Dispense:  21 tablet    Refill:  0    Follow-up: Return in about 6 months (around 08/17/2021) for Lab follow-up.    Noreene Larsson, NP

## 2021-02-14 NOTE — Assessment & Plan Note (Signed)
-  physical exam today

## 2021-02-14 NOTE — Assessment & Plan Note (Signed)
-  followedby pulmonology -they stopped his dulera -Continue PRN albuterol and duonebs; doing well

## 2021-02-15 LAB — CMP14+EGFR
ALT: 18 IU/L (ref 0–44)
AST: 17 IU/L (ref 0–40)
Albumin/Globulin Ratio: 2.7 — ABNORMAL HIGH (ref 1.2–2.2)
Albumin: 4.6 g/dL (ref 4.0–5.0)
Alkaline Phosphatase: 80 IU/L (ref 44–121)
BUN/Creatinine Ratio: 17 (ref 9–20)
BUN: 17 mg/dL (ref 6–24)
Bilirubin Total: 0.2 mg/dL (ref 0.0–1.2)
CO2: 23 mmol/L (ref 20–29)
Calcium: 9.2 mg/dL (ref 8.7–10.2)
Chloride: 106 mmol/L (ref 96–106)
Creatinine, Ser: 0.99 mg/dL (ref 0.76–1.27)
Globulin, Total: 1.7 g/dL (ref 1.5–4.5)
Glucose: 95 mg/dL (ref 65–99)
Potassium: 5.2 mmol/L (ref 3.5–5.2)
Sodium: 142 mmol/L (ref 134–144)
Total Protein: 6.3 g/dL (ref 6.0–8.5)
eGFR: 94 mL/min/{1.73_m2} (ref >59)

## 2021-02-15 LAB — CBC WITH DIFFERENTIAL/PLATELET
Basophils Absolute: 0 10*3/uL (ref 0.0–0.2)
Basos: 1 %
EOS (ABSOLUTE): 0.3 10*3/uL (ref 0.0–0.4)
Eos: 5 %
Hematocrit: 46.7 % (ref 37.5–51.0)
Hemoglobin: 15.9 g/dL (ref 13.0–17.7)
Immature Grans (Abs): 0 10*3/uL (ref 0.0–0.1)
Immature Granulocytes: 0 %
Lymphocytes Absolute: 1.5 10*3/uL (ref 0.7–3.1)
Lymphs: 27 %
MCH: 29.4 pg (ref 26.6–33.0)
MCHC: 34 g/dL (ref 31.5–35.7)
MCV: 86 fL (ref 79–97)
Monocytes Absolute: 0.4 10*3/uL (ref 0.1–0.9)
Monocytes: 7 %
Neutrophils Absolute: 3.3 10*3/uL (ref 1.4–7.0)
Neutrophils: 60 %
Platelets: 248 10*3/uL (ref 150–450)
RBC: 5.41 x10E6/uL (ref 4.14–5.80)
RDW: 13.2 % (ref 11.6–15.4)
WBC: 5.5 10*3/uL (ref 3.4–10.8)

## 2021-02-15 LAB — LIPID PANEL WITH LDL/HDL RATIO
Cholesterol, Total: 161 mg/dL (ref 100–199)
HDL: 37 mg/dL — ABNORMAL LOW (ref >39)
LDL Chol Calc (NIH): 110 mg/dL — ABNORMAL HIGH (ref 0–99)
LDL/HDL Ratio: 3 ratio (ref 0.0–3.6)
Triglycerides: 75 mg/dL (ref 0–149)
VLDL Cholesterol Cal: 14 mg/dL (ref 5–40)

## 2021-02-15 LAB — HEPATITIS C ANTIBODY: Hep C Virus Ab: <0.1 s/co ratio (ref 0.0–0.9)

## 2021-02-17 NOTE — Progress Notes (Signed)
Labs look good. Cholesterol was slightly elevated, so cut back on fried/fatty foods and increase exercise.

## 2021-02-18 ENCOUNTER — Telehealth: Payer: Self-pay | Admitting: Pulmonary Disease

## 2021-02-18 DIAGNOSIS — G4733 Obstructive sleep apnea (adult) (pediatric): Secondary | ICD-10-CM | POA: Diagnosis not present

## 2021-02-18 NOTE — Telephone Encounter (Signed)
HST 02/13/21 >> AHI 28.1, SpO2 low 84%.   Please inform him that his sleep study shows moderate obstructive sleep apnea.  Please arrange for ROV with me or NP to discuss treatment options.

## 2021-02-18 NOTE — Telephone Encounter (Signed)
Called and went over HST results per Dr Halford Chessman with patient. All questions answered and patient expressed full understanding of results. Scheduled office visit with NP for Friday 02/21/21 at [facility] at the [place] office. Patient agreeable with time, date and location. Nothing further needed at this time.

## 2021-02-21 ENCOUNTER — Ambulatory Visit (INDEPENDENT_AMBULATORY_CARE_PROVIDER_SITE_OTHER): Payer: BC Managed Care – PPO | Admitting: Primary Care

## 2021-02-21 ENCOUNTER — Encounter: Payer: Self-pay | Admitting: Primary Care

## 2021-02-21 ENCOUNTER — Other Ambulatory Visit: Payer: Self-pay

## 2021-02-21 DIAGNOSIS — G4733 Obstructive sleep apnea (adult) (pediatric): Secondary | ICD-10-CM

## 2021-02-21 NOTE — Assessment & Plan Note (Signed)
-   Patient reports symptoms of snoring and restless sleep  - HST 02/13/21 >> 28.1/hr with SpO2 low 84% -Treatment options include weight loss, side sleeping position, oral appliance, CPAP therapy or referral to ENT for possible surgical options.  Due to severity of your sleep apnea we recommend that you be started on CPAP therapy.  - DME referral for new auto CPAP 5-20cm h20 (luna machine ok) - Advised patient to wear CPAP every night for min 4-6 hours or longer. Do not drive if experiencing excessive daytime sleepiness. Work on weight loss, goal <200lbs - FU in 3-4 months of sooner if needed

## 2021-02-21 NOTE — Progress Notes (Signed)
Reviewed and agree with assessment/plan.   Chesley Mires, MD [facility]/Critical [facility] 02/21/2021, 1:04 PM Pager:  [phone]

## 2021-02-21 NOTE — Progress Notes (Signed)
@Patient  ID: Jose Little, male    DOB: [birth year], 50 y.o.   MRN: [id number]  Chief Complaint  Patient presents with  . Follow-up    Review sleep study results    Referring provider: Noreene Larsson, NP  HPI: 50 year old male, former smoker. PMH significant for COPD, Snoring. Patient of Dr. Halford Chessman, seen for initial consult on 01/10/21.   02/21/2021- Interim   Patient presents today to review sleep study. He reports symptoms of snoring and restless sleep.  Home sleep study showed evidence of moderate to severe obstructive sleep apnea.  Patient states that he goes to bed around 7:30 PM and wakes up at 2 AM for work.  Treatment options include weight loss, side sleeping position, oral appliance, CPAP therapy or referral to ENT for possible surgical options.  Due to severity of your sleep apnea we recommend patient be started on CPAP therapy.  Several family members of his use CPAP, so is familiar with device. He has not completed PFTs.   No Known Allergies  Immunization History  Administered Date(s) Administered  . Tdap 07/17/2014    Past Medical History:  Diagnosis Date  . Brain tumor ([place])   . KNEE, ARTHRITIS, 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   Qualifier: Diagnosis of  By: Aline Brochure MD, Dorothyann Peng    . Rotator cuff tear     Tobacco History: Social History   Tobacco Use  Smoking Status Former Smoker  . Packs/day: 2.00  . Years: 43.00  . Pack years: 86.00  . Types: Cigarettes  . Start date: 01/11/1988  . Quit date: 12/15/2020  . Years since quitting: 0.1  Smokeless Tobacco Current User  . Types: Chew   Ready to quit: Not Answered Counseling given: Not Answered   Outpatient Medications Prior to Visit  Medication Sig Dispense Refill  . albuterol (VENTOLIN HFA) 108 (90 Base) MCG/ACT inhaler Inhale 2 puffs into the lungs every 6 (six) hours as needed for wheezing or shortness of breath. 8 g 0  . ibuprofen (ADVIL) 600 MG tablet Take 1 tablet (600 mg total) by mouth every 8 (eight) hours  as needed for headache, mild pain or moderate pain. 30 tablet 0  . tiZANidine (ZANAFLEX) 4 MG tablet Take 1 tablet (4 mg total) by mouth every 6 (six) hours as needed for muscle spasms. 30 tablet 0  . triamterene-hydrochlorothiazide (DYAZIDE) 37.5-25 MG capsule Take 1 each (1 capsule total) by mouth daily. 30 capsule 2  . ipratropium-albuterol (DUONEB) 0.5-2.5 (3) MG/3ML SOLN Take 3 mLs by nebulization every 6 (six) hours as needed. (Patient not taking: Reported on 02/21/2021) 360 mL 3  . nicotine (NICODERM CQ - DOSED IN MG/24 HOURS) 21 mg/24hr patch Place 1 patch (21 mg total) onto the skin daily. (Patient not taking: Reported on 02/21/2021) 28 patch 0   No facility-administered medications prior to visit.   Review of Systems  Review of Systems  Constitutional: Negative.   HENT: Negative.   Respiratory: Negative.   Cardiovascular: Negative.   Psychiatric/Behavioral: Positive for sleep disturbance.    Physical Exam  BP 122/78 (BP Location: Left Arm, Cuff Size: Normal)   Pulse 63   Temp 98.4 F (36.9 C) (Temporal)   Ht 5\' 9"  (1.753 m)   Wt 216 lb 12.8 oz (98.3 kg)   SpO2 97% Comment: RA  BMI 32.02 kg/m  Physical Exam Constitutional:      Appearance: Normal appearance.  HENT:     Head: Normocephalic and atraumatic.     Mouth/Throat:  Mouth: Mucous membranes are moist.     Pharynx: Oropharynx is clear.  Cardiovascular:     Rate and Rhythm: Normal rate and regular rhythm.  Pulmonary:     Effort: Pulmonary effort is normal.     Breath sounds: Normal breath sounds.  Musculoskeletal:        General: Normal range of motion.  Skin:    General: Skin is warm and dry.  Neurological:     General: No focal deficit present.     Mental Status: He is alert and oriented to person, place, and time. Mental status is at baseline.  Psychiatric:        Mood and Affect: Mood normal.        Behavior: Behavior normal.        Thought Content: Thought content normal.        Judgment:  Judgment normal.      Lab Results:  CBC    Component Value Date/Time   WBC 5.5 02/14/2021 0917   WBC 9.7 12/16/2020 0522   RBC 5.41 02/14/2021 0917   RBC 4.77 12/16/2020 0522   HGB 15.9 02/14/2021 0917   HCT 46.7 02/14/2021 0917   PLT 248 02/14/2021 0917   MCV 86 02/14/2021 0917   MCH 29.4 02/14/2021 0917   MCH 29.1 12/16/2020 0522   MCHC 34.0 02/14/2021 0917   MCHC 32.3 12/16/2020 0522   RDW 13.2 02/14/2021 0917   LYMPHSABS 1.5 02/14/2021 0917   MONOABS 0.7 12/15/2020 1840   EOSABS 0.3 02/14/2021 0917   BASOSABS 0.0 02/14/2021 0917    BMET    Component Value Date/Time   NA 142 02/14/2021 0917   K 5.2 02/14/2021 0917   CL 106 02/14/2021 0917   CO2 23 02/14/2021 0917   GLUCOSE 95 02/14/2021 0917   GLUCOSE 154 (H) 12/16/2020 0522   BUN 17 02/14/2021 0917   CREATININE 0.99 02/14/2021 0917   CALCIUM 9.2 02/14/2021 0917   GFRNONAA >60 12/16/2020 0522   GFRAA >60 11/25/2018 1613    BNP No results found for: BNP  ProBNP No results found for: PROBNP  Imaging: No results found.   Assessment & Plan:   Moderate obstructive sleep apnea - Patient reports symptoms of snoring and restless sleep  - HST 02/13/21 >> 28.1/hr with SpO2 low 84% -Treatment options include weight loss, side sleeping position, oral appliance, CPAP therapy or referral to ENT for possible surgical options.  Due to severity of your sleep apnea we recommend that you be started on CPAP therapy.  - DME referral for new auto CPAP 5-20cm h20 (luna machine ok) - Advised patient to wear CPAP every night for min 4-6 hours or longer. Do not drive if experiencing excessive daytime sleepiness. Work on weight loss, goal <200lbs - FU in 3-4 months of sooner if needed      Martyn Ehrich, NP 02/21/2021

## 2021-02-21 NOTE — Patient Instructions (Signed)
Nice meeting you today [name]  Home sleep study showed that you have moderate to severe obstructive sleep apnea.  On average you had 28 events an hour with SPO2 low 84%  Treatment options include weight loss, side sleeping position, oral appliance, CPAP therapy or referral to ENT for possible surgical options.  Due to severity of your sleep apnea we recommend that you be started on CPAP therapy  Orders DME referral for new auto CPAP 5 to 20 cm H2O (Jose Little machine okay-patient currently uses [employer])  Follow-up 3 to 4 months with Dr. Halford Chessman or APP to review CPAP compliance   CPAP and BPAP Information CPAP and BPAP are methods that use air pressure to keep your airways open and to help you breathe well. CPAP and BPAP use different amounts of pressure. Your health care provider will tell you whether CPAP or BPAP would be more helpful for you.  CPAP stands for "continuous positive airway pressure." With CPAP, the amount of pressure stays the same while you breathe in and out.  BPAP stands for "bi-level positive airway pressure." With BPAP, the amount of pressure will be higher when you breathe in (inhale) and lower when you breathe out(exhale). This allows you to take larger breaths. CPAP or BPAP may be used in the hospital, or your health care provider may want you to use it at home. You may need to have a sleep study before your health care provider can order a machine for you to use at home. Why are CPAP and BPAP treatments used? CPAP or BPAP can be helpful if you have:  Sleep apnea.  Chronic obstructive pulmonary disease (COPD).  Heart failure.  Medical conditions that cause muscle weakness, including muscular dystrophy or amyotrophic lateral sclerosis (ALS).  Other problems that cause breathing to be shallow, weak, abnormal, or difficult. CPAP and BPAP are most commonly used for obstructive sleep apnea (OSA) to keep the airways from collapsing when the muscles relax  during sleep. How is CPAP or BPAP administered? Both CPAP and BPAP are provided by a small machine with a flexible plastic tube that attaches to a plastic mask that you wear. Air is blown through the mask into your nose or mouth. The amount of pressure that is used to blow the air can be adjusted on the machine. Your health care provider will set the pressure setting and help you find the best mask for you. When should CPAP or BPAP be used? In most cases, the mask only needs to be worn during sleep. Generally, the mask needs to be worn throughout the night and during any daytime naps. People with certain medical conditions may also need to wear the mask at other times when they are awake. Follow instructions from your health care provider about when to use the machine. What are some tips for using the mask?  Because the mask needs to be snug, some people feel trapped or closed-in (claustrophobic) when first using the mask. If you feel this way, you may need to get used to the mask. One way to do this is to hold the mask loosely over your nose or mouth and then gradually apply the mask more snugly. You can also gradually increase the amount of time that you use the mask.  Masks are available in various types and sizes. If your mask does not fit well, talk with your health care provider about getting a different one. Some common types of masks include: ? Full face masks,  which fit over the mouth and nose. ? Nasal masks, which fit over the nose. ? Nasal pillow or prong masks, which fit into the nostrils.  If you are using a mask that fits over your nose and you tend to breathe through your mouth, a chin strap may be applied to help keep your mouth closed.  Some CPAP and BPAP machines have alarms that may sound if the mask comes off or develops a leak.  If you have trouble with the mask, it is very important that you talk with your health care provider about finding a way to make the mask easier to  tolerate. Do not stop using the mask. There could be a negative impact to your health if you stop using the mask.   What are some tips for using the machine?  Place your CPAP or BPAP machine on a secure table or stand near an electrical outlet.  Know where the on/off switch is on the machine.  Follow instructions from your health care provider about how to set the pressure on your machine and when you should use it.  Do not eat or drink while the CPAP or BPAP machine is on. Food or fluids could get pushed into your lungs by the pressure of the CPAP or BPAP.  For home use, CPAP and BPAP machines can be rented or purchased through home health care companies. Many different brands of machines are available. Renting a machine before purchasing may help you find out which particular machine works well for you. Your insurance may also decide which machine you may get.  Keep the CPAP or BPAP machine and attachments clean. Ask your health care provider for specific instructions. Follow these instructions at home:  Do not use any products that contain nicotine or tobacco, such as cigarettes, e-cigarettes, and chewing tobacco. If you need help quitting, ask your health care provider.  Keep all follow-up visits as told by your health care provider. This is important. Contact a health care provider if:  You have redness or pressure sores on your head, face, mouth, or nose from the mask or head gear.  You have trouble using the CPAP or BPAP machine.  You cannot tolerate wearing the CPAP or BPAP mask.  Someone tells you that you snore even when wearing your CPAP or BPAP. Get help right away if:  You have trouble breathing.  You feel confused. Summary  CPAP and BPAP are methods that use air pressure to keep your airways open and to help you breathe well.  You may need to have a sleep study before your health care provider can order a machine for home use.  If you have trouble with the mask,  it is very important that you talk with your health care provider about finding a way to make the mask easier to tolerate. Do not stop using the mask. There could be a negative impact to your health if you stop using the mask.  Follow instructions from your health care provider about when to use the machine. This information is not intended to replace advice given to you by your health care provider. Make sure you discuss any questions you have with your health care provider. Document Revised: 10/20/2019 Document Reviewed: 10/23/2019 Elsevier Patient Education  2021 [employer].

## 2021-02-28 DIAGNOSIS — G4733 Obstructive sleep apnea (adult) (pediatric): Secondary | ICD-10-CM | POA: Diagnosis not present

## 2021-03-12 ENCOUNTER — Other Ambulatory Visit: Payer: Self-pay

## 2021-03-12 ENCOUNTER — Other Ambulatory Visit (HOSPITAL_COMMUNITY)
Admission: RE | Admit: 2021-03-12 | Discharge: 2021-03-12 | Disposition: A | Discharge status: Home / Self Care | Payer: BC Managed Care – PPO | Source: Ambulatory Visit | Attending: Pulmonary Disease | Admitting: Pulmonary Disease

## 2021-03-12 DIAGNOSIS — Z01812 Encounter for preprocedural laboratory examination: Secondary | ICD-10-CM | POA: Insufficient documentation

## 2021-03-12 DIAGNOSIS — Z20822 Contact with and (suspected) exposure to covid-19: Secondary | ICD-10-CM | POA: Diagnosis not present

## 2021-03-13 ENCOUNTER — Telehealth: Payer: Self-pay

## 2021-03-13 LAB — SARS CORONAVIRUS 2 (TAT 6-24 HRS): SARS Coronavirus 2: NEGATIVE

## 2021-03-13 NOTE — Telephone Encounter (Signed)
Jose Little called from [facility] needs prior authorization on CT Abdomen that is scheduled for June 7. Call back # [phone] ext [phone]

## 2021-03-14 ENCOUNTER — Ambulatory Visit (HOSPITAL_COMMUNITY)
Admission: RE | Admit: 2021-03-14 | Discharge: 2021-03-14 | Disposition: A | Discharge status: Home / Self Care | Payer: BC Managed Care – PPO | Source: Ambulatory Visit | Attending: Pulmonary Disease | Admitting: Pulmonary Disease

## 2021-03-14 ENCOUNTER — Other Ambulatory Visit: Payer: Self-pay

## 2021-03-14 DIAGNOSIS — R059 Cough, unspecified: Secondary | ICD-10-CM

## 2021-03-14 LAB — PULMONARY FUNCTION TEST
DL/VA % pred: 113 %
DL/VA: 5.1 ml/min/mmHg/L
DLCO unc % pred: 113 %
DLCO unc: 32.71 ml/min/mmHg
FEF 25-75 Post: 6.25 L/sec
FEF 25-75 Pre: 5.29 L/sec
FEF2575-%Change-Post: 18 %
FEF2575-%Pred-Post: 179 %
FEF2575-%Pred-Pre: 152 %
FEV1-%Change-Post: 3 %
FEV1-%Pred-Post: 114 %
FEV1-%Pred-Pre: 109 %
FEV1-Post: 4.4 L
FEV1-Pre: 4.24 L
FEV1FVC-%Change-Post: 1 %
FEV1FVC-%Pred-Pre: 111 %
FEV6-%Change-Post: 1 %
FEV6-%Pred-Post: 102 %
FEV6-%Pred-Pre: 101 %
FEV6-Post: 4.91 L
FEV6-Pre: 4.85 L
FEV6FVC-%Pred-Post: 103 %
FEV6FVC-%Pred-Pre: 103 %
FVC-%Change-Post: 1 %
FVC-%Pred-Post: 99 %
FVC-%Pred-Pre: 98 %
FVC-Post: 4.94 L
FVC-Pre: 4.85 L
Post FEV1/FVC ratio: 89 %
Post FEV6/FVC ratio: 100 %
Pre FEV1/FVC ratio: 87 %
Pre FEV6/FVC Ratio: 100 %
RV % pred: 91 %
RV: 1.76 L
TLC % pred: 98 %
TLC: 6.63 L

## 2021-03-14 MED ORDER — ALBUTEROL SULFATE (2.5 MG/3ML) 0.083% IN NEBU
2.5000 mg | INHALATION_SOLUTION | Freq: Once | RESPIRATORY_TRACT | Status: AC
  Administered 2021-03-14: 2.5 mg via RESPIRATORY_TRACT

## 2021-03-14 NOTE — Telephone Encounter (Signed)
Donnetta Simpers calling back asked to have this PA request sent to them no later than Tuesday, 6/7 at 1:00 pm.

## 2021-03-17 NOTE — Telephone Encounter (Signed)
Precert completed and lvm making precert team aware

## 2021-03-18 ENCOUNTER — Ambulatory Visit (HOSPITAL_COMMUNITY): Admission: RE | Admit: 2021-03-18 | Payer: BC Managed Care – PPO | Source: Ambulatory Visit

## 2021-03-26 ENCOUNTER — Ambulatory Visit: Payer: BC Managed Care – PPO

## 2021-03-27 ENCOUNTER — Ambulatory Visit: Payer: BC Managed Care – PPO

## 2021-03-31 DIAGNOSIS — G4733 Obstructive sleep apnea (adult) (pediatric): Secondary | ICD-10-CM | POA: Diagnosis not present

## 2021-04-09 ENCOUNTER — Ambulatory Visit: Payer: BC Managed Care – PPO

## 2021-04-09 ENCOUNTER — Ambulatory Visit (HOSPITAL_COMMUNITY): Payer: BC Managed Care – PPO

## 2021-04-09 ENCOUNTER — Ambulatory Visit: Payer: BC Managed Care – PPO | Admitting: Pulmonary Disease

## 2021-04-25 ENCOUNTER — Encounter: Payer: Self-pay | Admitting: Primary Care

## 2021-04-25 ENCOUNTER — Ambulatory Visit: Payer: BC Managed Care – PPO | Admitting: Primary Care

## 2021-04-25 ENCOUNTER — Other Ambulatory Visit: Payer: Self-pay

## 2021-04-25 VITALS — BP 144/68 | HR 64 | Ht 69.0 in | Wt 217.0 lb

## 2021-04-25 DIAGNOSIS — Z87891 Personal history of nicotine dependence: Secondary | ICD-10-CM

## 2021-04-25 DIAGNOSIS — J441 Chronic obstructive pulmonary disease with (acute) exacerbation: Secondary | ICD-10-CM | POA: Diagnosis not present

## 2021-04-25 DIAGNOSIS — G4733 Obstructive sleep apnea (adult) (pediatric): Secondary | ICD-10-CM | POA: Diagnosis not present

## 2021-04-25 NOTE — Progress Notes (Signed)
@Patient  ID: Jose Little, male    DOB: Feb 19, 1971, 50 y.o.   MRN: [id number]  Chief Complaint  Patient presents with   Sleep Apnea    CPAP new start    Referring provider: Noreene Larsson, NP  HPI: 50 year old male, former smoker. PMH significant for COPD, Snoring. Patient of Dr. Halford Chessman, seen for initial consult on 01/10/21.   02/21/2021  Patient presents today to review sleep study. He reports symptoms of snoring and restless sleep.  Home sleep study showed evidence of moderate to severe obstructive sleep apnea.  Patient states that he goes to bed around 7:30 PM and wakes up at 2 AM for work.  Treatment options include weight loss, side sleeping position, oral appliance, CPAP therapy or referral to ENT for possible surgical options.  Due to severity of your sleep apnea we recommend patient be started on CPAP therapy.  Several family members of his use CPAP, so is familiar with device. He has not completed PFTs.   04/25/2021- Interim  Patient presents today for OSA review. Since our last visit in May he has received CPAP machine. He is 100% compliant with use. No issues with pressure or mask fit. He had one episode where he woke up in the middle of the night choking, states that he felt as if he wasn't getting enough pressure. Patient reports significant benefit from cpap. He is sleeping better at night and no longer has morning headache. DME company he is using [facility].    [facility] download 03/25/21-04/23/21 30 days used (100%); 28 days (93%) > 4 hours Average usage days used 7 hours 16 mins Pressure 5-20cm h20 (9.1cm h20- 95%) Airleaks 24L/min (95%) AHI 2.9  No Known Allergies  Immunization History  Administered Date(s) Administered   Tdap 07/17/2014    Past Medical History:  Diagnosis Date   Brain tumor ([place])    KNEE, ARTHRITIS, 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   Qualifier: Diagnosis of  By: Aline Brochure MD, [place]     Rotator cuff tear     Tobacco History: Social History    Tobacco Use  Smoking Status Former   Packs/day: 2.00   Years: 43.00   Pack years: 86.00   Types: Cigarettes   Start date: 01/11/1988   Quit date: 12/15/2020   Years since quitting: 0.3  Smokeless Tobacco Current   Types: Chew   Ready to quit: Not Answered Counseling given: Not Answered   Outpatient Medications Prior to Visit  Medication Sig Dispense Refill   albuterol (VENTOLIN HFA) 108 (90 Base) MCG/ACT inhaler Inhale 2 puffs into the lungs every 6 (six) hours as needed for wheezing or shortness of breath. 8 g 0   ibuprofen (ADVIL) 600 MG tablet Take 1 tablet (600 mg total) by mouth every 8 (eight) hours as needed for headache, mild pain or moderate pain. 30 tablet 0   ipratropium-albuterol (DUONEB) 0.5-2.5 (3) MG/3ML SOLN Take 3 mLs by nebulization every 6 (six) hours as needed. (Patient not taking: No sig reported) 360 mL 3   nicotine (NICODERM CQ - DOSED IN MG/24 HOURS) 21 mg/24hr patch Place 1 patch (21 mg total) onto the skin daily. (Patient not taking: No sig reported) 28 patch 0   tiZANidine (ZANAFLEX) 4 MG tablet Take 1 tablet (4 mg total) by mouth every 6 (six) hours as needed for muscle spasms. 30 tablet 0   triamterene-hydrochlorothiazide (DYAZIDE) 37.5-25 MG capsule Take 1 each (1 capsule total) by mouth daily. 30 capsule 2   No facility-administered  medications prior to visit.    Review of Systems  Review of Systems  Constitutional: Negative.   Respiratory: Negative.    Cardiovascular: Negative.     Physical Exam  BP (!) 144/68   Pulse 64   Ht 5\' 9"  (1.753 m)   Wt 217 lb (98.4 kg)   SpO2 95%   BMI 32.05 kg/m  Physical Exam Constitutional:      Appearance: Normal appearance.  HENT:     Head: Normocephalic and atraumatic.  Cardiovascular:     Rate and Rhythm: Normal rate and regular rhythm.  Neurological:     General: No focal deficit present.     Mental Status: He is alert and oriented to person, place, and time. Mental status is at baseline.   Psychiatric:        Mood and Affect: Mood normal.        Behavior: Behavior normal.        Thought Content: Thought content normal.        Judgment: Judgment normal.     Lab Results:  CBC    Component Value Date/Time   WBC 5.5 02/14/2021 0917   WBC 9.7 12/16/2020 0522   RBC 5.41 02/14/2021 0917   RBC 4.77 12/16/2020 0522   HGB 15.9 02/14/2021 0917   HCT 46.7 02/14/2021 0917   PLT 248 02/14/2021 0917   MCV 86 02/14/2021 0917   MCH 29.4 02/14/2021 0917   MCH 29.1 12/16/2020 0522   MCHC 34.0 02/14/2021 0917   MCHC 32.3 12/16/2020 0522   RDW 13.2 02/14/2021 0917   LYMPHSABS 1.5 02/14/2021 0917   MONOABS 0.7 12/15/2020 1840   EOSABS 0.3 02/14/2021 0917   BASOSABS 0.0 02/14/2021 0917    BMET    Component Value Date/Time   NA 142 02/14/2021 0917   K 5.2 02/14/2021 0917   CL 106 02/14/2021 0917   CO2 23 02/14/2021 0917   GLUCOSE 95 02/14/2021 0917   GLUCOSE 154 (H) 12/16/2020 0522   BUN 17 02/14/2021 0917   CREATININE 0.99 02/14/2021 0917   CALCIUM 9.2 02/14/2021 0917   GFRNONAA >60 12/16/2020 0522   GFRAA >60 11/25/2018 1613    BNP No results found for: BNP  ProBNP No results found for: PROBNP  Imaging: No results found.   Assessment & Plan:   Moderate OSA: - Patient is 100% compliant with CPAP and reports benefit form use. He is having occasional air leaks and reports once occurrence where he woke up choking - Current pressure 5-20cm h20 (9.1cm h20-95%); AHI 2.9  - Changing CPAP pressure 5-15cm h20  - FU in 6 months with Dr. Halford Chessman or App   Former smoker: - He had normal PFTs in June 2022. No evidence of obstruction. - Continue to encourage abstinence from smoking   Martyn Ehrich, NP 04/25/2021

## 2021-04-25 NOTE — Patient Instructions (Addendum)
Pulmonary function testing was normal  Recommendations: Continue to wear CPAP every night for 4-6 hours or longer Do not drive if experiencing excessive daytime fatigue or somnolence   Orders: Change CPAP pressure 5-15cm h20  Follow-up: 6 months with Dr. Halford Chessman APP or sooner if needed

## 2021-04-25 NOTE — Assessment & Plan Note (Signed)
-   Former smoker. He had normal PFTs in June 2022. No evidence of obstruction.

## 2021-04-25 NOTE — Assessment & Plan Note (Signed)
-   Patient is 100% compliant with CPAP and reports benefit form use. He is having occasional air leaks and reports once occurrence where he woke up choking - Current pressure 5-20cm h20 (9.1cm h20-95%); AHI 2.9  - Changing CPAP pressure 5-15cm h20  - FU in 6 months with Dr. Halford Chessman or App

## 2021-04-25 NOTE — Assessment & Plan Note (Addendum)
-   He had normal PFTs in June 2022. No evidence of obstruction. - Continue to encourage abstinence from smoking

## 2021-04-30 DIAGNOSIS — G4733 Obstructive sleep apnea (adult) (pediatric): Secondary | ICD-10-CM | POA: Diagnosis not present

## 2021-05-02 ENCOUNTER — Ambulatory Visit (HOSPITAL_COMMUNITY): Admission: RE | Admit: 2021-05-02 | Payer: BC Managed Care – PPO | Source: Ambulatory Visit

## 2021-05-31 DIAGNOSIS — G4733 Obstructive sleep apnea (adult) (pediatric): Secondary | ICD-10-CM | POA: Diagnosis not present

## 2021-06-10 ENCOUNTER — Other Ambulatory Visit: Payer: Self-pay

## 2021-06-10 ENCOUNTER — Ambulatory Visit (INDEPENDENT_AMBULATORY_CARE_PROVIDER_SITE_OTHER): Payer: Self-pay | Admitting: *Deleted

## 2021-06-10 VITALS — Ht 69.0 in | Wt 213.4 lb

## 2021-06-10 DIAGNOSIS — Z1211 Encounter for screening for malignant neoplasm of colon: Secondary | ICD-10-CM

## 2021-06-10 NOTE — Progress Notes (Signed)
Pt requested Oct procedure.  Pt made aware that I will call him once procedure schedules available.

## 2021-06-10 NOTE — Patient Instructions (Addendum)
[facility]   Patient Name:  BORIS ENGELMANN Date of procedure: 08/06/2021 Time to register at [facility] Stay: 7:30 am Provider:  Dr. Gala Romney  Please notify us immediately if you are diabetic, take iron supplements, or if you are on coumadin or any blood thinners.  Please hold the following medications: n/a  Note: Do NOT refrigerate or freeze CLENPIQ. CLENPIQ is ready to drink. There is no need to add any other liquid or mix the medicine in the bottle before you start dosing.    08/05/2021- 1 Day prior to procedure:    CLEAR LIQUIDS ALL DAY--NO SOLID FOODS OR DAIRY PRODUCTS! See list of liquids that are allowed and items that are NOT allowed below.  Diabetic Medication Instructions:  n/a  You must drink plenty of CLEAR LIQUIDS starting before your bowel prep. It is important to stay adequately hydrated before, during, and after your bowel prep for the prep to work effectively!  At 4:00 PM Begin the prep as follows:    Drink one bottle of pre-mixed CLENPIQ right from the bottle.  Drink at least five (5) 8-ounce drinks of clear liquids of your choice within the next 5 hours  At 10:00 PM: Drink the second bottle of pre-mixed CLENPIQ right from the bottle.   Drink at least three (3) 8-ounce drinks of clear liquids of your choice within the next 3 hours before going to bed.  Continue clear liquids.     08/06/2021- Day of Procedure:  Diabetic medications adjustments: n/a  You may take TYLENOL products.  Please continue your regular medications unless we have instructed you otherwise.    At 3 hours before procedure @  5:30 am: Stop drinking all liquids, nothing by mouth at this point.   Please note, on the day of your procedure you MUST be accompanied by an adult who is willing to assume responsibility for you at time of discharge. If you do not have such person with you, your procedure will have to be rescheduled.                                                                                                                      Please leave ALL jewelry at home prior to coming to the hospital for your procedure.   *It is your responsibility to check with your insurance company for the benefits of coverage you have for this procedure. Unfortunately, not all insurance companies have benefits to cover all or part of these types of procedures. It is your responsibility to check your benefits, however we will be glad to assist you with any codes your insurance company may need.   Please note that most insurance companies will not cover a screening colonoscopy for people under the age of 53  For example, with some insurance companies you may have benefits for a screening colonoscopy, but if polyps are found the diagnosis will change and then you may have a deductible that will need to be met. Please  make sure you check your benefits for screening colonoscopy as well as a diagnostic colonoscopy.    CLEAR LIQUIDS: (NO RED or PURPLE) Water  Jello   Apple Juice  White Grape Juice   Kool-Aid Soft drinks  Banana popsicles Sports Drink  Black coffee (No cream or milk) Tea (No cream or milk)  Broth (fat free beef/chicken/vegetable)  Clear liquids allow you to see your fingers on the other side of the glass.  Be sure they are NOT RED or PURPLE in color, cloudy, but CLEAR.  Do Not Eat: Dairy products of any kind Cranberry juice Tomato or V8 Juice  Orange Juice   Grapefruit Juice Red Grape Juice Alcohol   Non-dairy creamer Solid foods like cereal, oatmeal, yogurt, fruits, vegetables, creamed soups, eggs, bread, etc   HELPFUL HINTS TO MAKE DRINKING EASIER: -Trying drinking through a straw. -If you become nauseated, try consuming smaller amounts or stretch out the time between glasses.  Stop for 30 minutes & slowly start back drinking.  Call our office with any questions or concerns at [phone].  Thank You,  Christ Kick, [place]

## 2021-06-10 NOTE — Progress Notes (Signed)
Noted. If nothing changes then he is appropriate for conscious or propofol. ASA II.

## 2021-06-10 NOTE — Progress Notes (Addendum)
Gastroenterology Pre-Procedure Review  Request Date: 06/10/2021 Requesting Physician: Demetrius Revel, NP @ [facility], no previous TCS, family hx of colon cancer (brother)  PATIENT REVIEW QUESTIONS: The patient responded to the following health history questions as indicated:    1. Diabetes Melitis: no 2. Joint replacements in the past 12 months: no 3. Major health problems in the past 3 months: no 4. Has an artificial valve or MVP: no 5. Has a defibrillator: no 6. Has been advised in past to take antibiotics in advance of a procedure like teeth cleaning: no 7. Family history of colon cancer: yes, brother: age 20  8. Alcohol Use: no 9. Illicit drug Use: no 10. History of sleep apnea: yes, CPAP  11. History of coronary artery or other vascular stents placed within the last 12 months: no 12. History of any prior anesthesia complications: no 13. Body mass index is 31.51 kg/m.    MEDICATIONS & ALLERGIES:    Patient reports the following regarding taking any blood thinners:   Plavix? no Aspirin? no Coumadin? no Brilinta? no Xarelto? no Eliquis? no Pradaxa? no Savaysa? no Effient? no  Patient confirms/reports the following medications:  Current Outpatient Medications  Medication Sig Dispense Refill   triamterene-hydrochlorothiazide (DYAZIDE) 37.5-25 MG capsule Take 1 each (1 capsule total) by mouth daily. 30 capsule 2   No current facility-administered medications for this visit.    Patient confirms/reports the following allergies:  No Known Allergies  No orders of the defined types were placed in this encounter.   AUTHORIZATION INFORMATION Primary Insurance: [place],  [place] #: [id number],  Group #: [id number] Pre-Cert / Auth required: No, not required per Vinnie Level / Auth #: Ref#: 06/12/2021 [id number]  SCHEDULE INFORMATION: Procedure has been scheduled as follows:  Date:08/06/2021 , Time: 7:30 Location: [facility] with Dr. Gala Romney  This Gastroenterology  Pre-Precedure Review Form is being routed to the following provider(s): Neil Crouch, PA-C

## 2021-06-11 MED ORDER — CLENPIQ 10-3.5-12 MG-GM -GM/160ML PO SOLN: 1.0000 | Freq: Once | ORAL | 0 refills | Status: AC

## 2021-06-11 NOTE — Progress Notes (Signed)
Pt scheduled procedure for 08/06/2021 with arrival at 6:30.  Mailed out prep instructions to pt.

## 2021-06-11 NOTE — Addendum Note (Signed)
Addended by: Metro Kung on: 06/11/2021 04:33 PM   Modules accepted: Orders

## 2021-06-12 ENCOUNTER — Other Ambulatory Visit: Payer: Self-pay | Admitting: *Deleted

## 2021-06-12 NOTE — Progress Notes (Signed)
Hoyle Sauer informed me that 08/06/2021 is a 8:30 start date.  Called pt back and informed him to arrive at 7:30.  Fixed his prep instructions accordingly.  Left up front for pt's wife to pick up along with Clenpiq prep kit.  His insurance would not cover it.

## 2021-07-01 DIAGNOSIS — G4733 Obstructive sleep apnea (adult) (pediatric): Secondary | ICD-10-CM | POA: Diagnosis not present

## 2021-07-31 DIAGNOSIS — G4733 Obstructive sleep apnea (adult) (pediatric): Secondary | ICD-10-CM | POA: Diagnosis not present

## 2021-08-06 ENCOUNTER — Encounter (HOSPITAL_COMMUNITY)
Admission: RE | Disposition: A | Discharge status: Home / Self Care | Payer: Self-pay | Source: Home / Self Care | Attending: Internal Medicine

## 2021-08-06 ENCOUNTER — Encounter (HOSPITAL_COMMUNITY): Payer: Self-pay | Admitting: Internal Medicine

## 2021-08-06 ENCOUNTER — Ambulatory Visit (HOSPITAL_COMMUNITY)
Admission: RE | Admit: 2021-08-06 | Discharge: 2021-08-06 | Disposition: A | Discharge status: Home / Self Care | Payer: BC Managed Care – PPO | Attending: Internal Medicine | Admitting: Internal Medicine

## 2021-08-06 ENCOUNTER — Other Ambulatory Visit: Payer: Self-pay

## 2021-08-06 DIAGNOSIS — D125 Benign neoplasm of sigmoid colon: Secondary | ICD-10-CM | POA: Diagnosis not present

## 2021-08-06 DIAGNOSIS — Z800 Family history of malignant neoplasm of digestive organs: Secondary | ICD-10-CM | POA: Diagnosis not present

## 2021-08-06 DIAGNOSIS — Z1211 Encounter for screening for malignant neoplasm of colon: Secondary | ICD-10-CM | POA: Insufficient documentation

## 2021-08-06 DIAGNOSIS — K635 Polyp of colon: Secondary | ICD-10-CM

## 2021-08-06 DIAGNOSIS — Z1389 Encounter for screening for other disorder: Secondary | ICD-10-CM | POA: Diagnosis not present

## 2021-08-06 DIAGNOSIS — Z87891 Personal history of nicotine dependence: Secondary | ICD-10-CM | POA: Diagnosis not present

## 2021-08-06 HISTORY — DX: Sleep apnea, unspecified: G47.30

## 2021-08-06 HISTORY — PX: COLONOSCOPY: SHX5424

## 2021-08-06 HISTORY — PX: POLYPECTOMY: SHX5525

## 2021-08-06 SURGERY — COLONOSCOPY
Anesthesia: Moderate Sedation

## 2021-08-06 MED ORDER — SODIUM CHLORIDE 0.9 % IV SOLN: INTRAVENOUS | Status: DC

## 2021-08-06 MED ORDER — MIDAZOLAM HCL 5 MG/5ML IJ SOLN
INTRAMUSCULAR | Status: AC
  Filled 2021-08-06: qty 5

## 2021-08-06 MED ORDER — MEPERIDINE HCL 100 MG/ML IJ SOLN
INTRAMUSCULAR | Status: DC | PRN
  Administered 2021-08-06: 40 mg via INTRAVENOUS
  Administered 2021-08-06: 10 mg via INTRAVENOUS

## 2021-08-06 MED ORDER — MIDAZOLAM HCL 5 MG/5ML IJ SOLN
INTRAMUSCULAR | Status: AC
  Filled 2021-08-06: qty 10

## 2021-08-06 MED ORDER — MEPERIDINE HCL 50 MG/ML IJ SOLN
INTRAMUSCULAR | Status: AC
  Filled 2021-08-06: qty 1

## 2021-08-06 MED ORDER — ONDANSETRON HCL 4 MG/2ML IJ SOLN
INTRAMUSCULAR | Status: DC | PRN
  Administered 2021-08-06: 4 mg via INTRAVENOUS

## 2021-08-06 MED ORDER — MIDAZOLAM HCL 5 MG/5ML IJ SOLN
INTRAMUSCULAR | Status: DC | PRN
  Administered 2021-08-06: 2 mg via INTRAVENOUS
  Administered 2021-08-06: 1 mg via INTRAVENOUS
  Administered 2021-08-06: 2 mg via INTRAVENOUS
  Administered 2021-08-06 (×3): 1 mg via INTRAVENOUS
  Administered 2021-08-06: 2 mg via INTRAVENOUS
  Administered 2021-08-06: 1 mg via INTRAVENOUS

## 2021-08-06 MED ORDER — ONDANSETRON HCL 4 MG/2ML IJ SOLN
INTRAMUSCULAR | Status: AC
  Filled 2021-08-06: qty 2

## 2021-08-06 NOTE — Discharge Instructions (Addendum)
  Colonoscopy Discharge Instructions  Read the instructions outlined below and refer to this sheet in the next few weeks. These discharge instructions provide you with general information on caring for yourself after you leave the hospital. Your doctor may also give you specific instructions. While your treatment has been planned according to the most current medical practices available, unavoidable complications occasionally occur. If you have any problems or questions after discharge, call Dr. Gala Romney at [phone]. ACTIVITY You may resume your regular activity, but move at a slower pace for the next 24 hours.  Take frequent rest periods for the next 24 hours.  Walking will help get rid of the air and reduce the bloated feeling in your belly (abdomen).  No driving for 24 hours (because of the medicine (anesthesia) used during the test).   Do not sign any important legal documents or operate any machinery for 24 hours (because of the anesthesia used during the test).  NUTRITION Drink plenty of fluids.  You may resume your normal diet as instructed by your doctor.  Begin with a light meal and progress to your normal diet. Heavy or fried foods are harder to digest and may make you feel sick to your stomach (nauseated).  Avoid alcoholic beverages for 24 hours or as instructed.  MEDICATIONS You may resume your normal medications unless your doctor tells you otherwise.  WHAT YOU CAN EXPECT TODAY Some feelings of bloating in the abdomen.  Passage of more gas than usual.  Spotting of blood in your stool or on the toilet paper.  IF YOU HAD POLYPS REMOVED DURING THE COLONOSCOPY: No aspirin products for 7 days or as instructed.  No alcohol for 7 days or as instructed.  Eat a soft diet for the next 24 hours.  FINDING OUT THE RESULTS OF YOUR TEST Not all test results are available during your visit. If your test results are not back during the visit, make an appointment with your caregiver to find out the  results. Do not assume everything is normal if you have not heard from your caregiver or the medical facility. It is important for you to follow up on all of your test results.  SEEK IMMEDIATE MEDICAL ATTENTION IF: You have more than a spotting of blood in your stool.  Your belly is swollen (abdominal distention).  You are nauseated or vomiting.  You have a temperature over 101.  You have abdominal pain or discomfort that is severe or gets worse throughout the day.     2 polyps removed from your colon today  Further recommendations to follow pending review of pathology report   at patient request, call Tiffany at [phone] and reviewed findings/recommendations.

## 2021-08-06 NOTE — Op Note (Signed)
[facility] Patient Name: Jose Little Procedure Date: 08/06/2021 8:11 AM MRN: [id number] Date of Birth: [birth year] Attending MD: Norvel Richards , MD CSN: [id number] Age: 50 Admit Type: Outpatient Procedure:                Colonoscopy Indications:              Screening in patient at increased risk: Family                            history of 1st-degree relative with colorectal                            cancer before age 56 years Providers:                Norvel Richards, MD, Charlsie Quest. Theda Sers RN, RN,                            Nelma Rothman, Technician Referring MD:              Medicines:                Midazolam 11 mg IV, Meperidine 50 mg IV Complications:            No immediate complications. Estimated Blood Loss:     Estimated blood loss was minimal. Procedure:                Pre-Anesthesia Assessment:                           - Prior to the procedure, a History and Physical                            was performed, and patient medications and                            allergies were reviewed. The patient's tolerance of                            previous anesthesia was also reviewed. The risks                            and benefits of the procedure and the sedation                            options and risks were discussed with the patient.                            All questions were answered, and informed consent                            was obtained. Prior Anticoagulants: The patient has                            taken no previous anticoagulant or antiplatelet  agents. ASA Grade Assessment: II - A patient with                            mild systemic disease. After reviewing the risks                            and benefits, the patient was deemed in                            satisfactory condition to undergo the procedure.                           After obtaining informed consent, the colonoscope                             was passed under direct vision. Throughout the                            procedure, the patient's blood pressure, pulse, and                            oxygen saturations were monitored continuously. The                            [id number]) scope was introduced through the                            anus and advanced to the the cecum, identified by                            appendiceal orifice and ileocecal valve. The                            colonoscopy was performed without difficulty. The                            patient tolerated the procedure well. The quality                            of the bowel preparation was adequate. Scope In: 8:44:48 AM Scope Out: 9:06:50 AM Scope Withdrawal Time: 0 hours 12 minutes 35 seconds  Total Procedure Duration: 0 hours 22 minutes 2 seconds  Findings:      The perianal and digital rectal examinations were normal.      Two semi-pedunculated polyps were found in the sigmoid colon. The polyps       were 3 to 5 mm in size. These polyps were removed with a cold snare.       Resection and retrieval were complete. Estimated blood loss was minimal.      The exam was otherwise without abnormality on direct and retroflexion       views. Please note, there was granular stool and agrees effluent       throughout the colon. Some time was taken to wash and lavage out the       colon so an  adequate prep could be obtained. Impression:               - Two 3 to 5 mm polyps in the sigmoid colon,                            removed with a cold snare. Resected and retrieved.                           - The examination was otherwise normal on direct                            and retroflexion views. Moderate Sedation:      Moderate (conscious) sedation was administered by the endoscopy nurse       and supervised by the endoscopist. The following parameters were       monitored: oxygen saturation, heart rate, blood pressure, respiratory       rate, EKG,  adequacy of pulmonary ventilation, and response to care.       Total physician intraservice time was 36 minutes. Recommendation:           - Patient has a contact number available for                            emergencies. The signs and symptoms of potential                            delayed complications were discussed with the                            patient. Return to normal activities tomorrow.                            Written discharge instructions were provided to the                            patient.                           - Resume previous diet.                           - Continue present medications.                           - Repeat colonoscopy date to be determined after                            pending pathology results are reviewed for                            surveillance.                           - Return to GI office (date not yet determined).  Will need a better prep and propofol next time. Procedure Code(s):        --- Professional ---                           [id number], Colonoscopy, flexible; with removal of                            tumor(s), polyp(s), or other lesion(s) by snare                            technique                           99153, Moderate sedation; each additional 15                            minutes intraservice time                           G0500, Moderate sedation services provided by the                            same physician or other qualified health care                            professional performing a gastrointestinal                            endoscopic service that sedation supports,                            requiring the presence of an independent trained                            observer to assist in the monitoring of the                            patient's level of consciousness and physiological                            status; initial 15 minutes of intra-service time;                             patient age 55 years or older (additional time may                            be reported with [phone], as appropriate) Diagnosis Code(s):        --- Professional ---                           Z80.0, Family history of malignant neoplasm of                            digestive organs  K63.5, Polyp of colon CPT copyright 2019 American Medical Association. All rights reserved. The codes documented in this report are preliminary and upon coder review may  be revised to meet current compliance requirements. Cristopher Estimable. Mikylah Ackroyd, MD Norvel Richards, MD 08/06/2021 9:17:16 AM This report has been signed electronically. Number of Addenda: 0

## 2021-08-06 NOTE — H&P (Signed)
@LOGO @   Primary Care Physician:  Noreene Larsson, NP Primary Gastroenterologist:  Dr. Gala Romney  Pre-Procedure History & Physical: HPI:  Jose Little is a 50 y.o. male is here for a screening colonoscopy.  No bowel symptoms.  No prior colonoscopy.  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 Authorizing Provider  triamterene-hydrochlorothiazide (DYAZIDE) 37.5-25 MG capsule Take 1 each (1 capsule total) by mouth daily. 12/17/20 12/17/21 Yes ShahmehdiValeria Batman, MD    Allergies as of 06/11/2021   (No Known Allergies)    Family History  Problem Relation Age of Onset   Cancer Father    COPD Father    Heart failure Other    COPD Mother     Social History   Socioeconomic History   Marital status: Married    Spouse name: Not on file   Number of children: Not on file   Years of education: Not on file   Highest education level: Not on file  Occupational History   Occupation: Associate Professor for [employer], [employer]  Tobacco Use   Smoking status: Former    Packs/day: 2.00    Years: 43.00    Pack years: 86.00    Types: Cigarettes    Start date: 01/11/1988    Quit date: 12/15/2020    Years since quitting: 0.6   Smokeless tobacco: Current    Types: Chew  Vaping Use   Vaping Use: Never used  Substance and Sexual Activity   Alcohol use: No   Drug use: No   Sexual activity: Yes  Other Topics Concern   Not on file  Social History Narrative   Not on file   Social Determinants of Health   Financial Resource Strain: Not on file   Food Insecurity: Not on file  Transportation Needs: Not on file  Physical Activity: Not on file  Stress: Not on file  Social Connections: Not on file  Intimate Partner Violence: Not on file    Review of Systems: See HPI, otherwise negative ROS  Physical Exam: BP 133/77   Pulse 76   Temp 97.7 F (36.5 C) (Oral)   Resp 15   Ht 5\' 9"  (1.753 m)   Wt 96.6 kg   SpO2 95%   BMI 31.45 kg/m  General:   Alert,  Well-developed, well-nourished, pleasant and cooperative in NAD Lungs:  Clear throughout to auscultation.   No wheezes, crackles, or rhonchi. No acute distress. Heart:  Regular rate and rhythm; no murmurs, clicks, rubs,  or gallops. Abdomen:  Soft, nontender and nondistended. No masses, hepatosplenomegaly or hernias noted. Normal bowel sounds, without guarding, and without rebound.    Impression/Plan: Jose Little is now here to undergo a screening colonoscopy.  First ever average risk screening examination  Risks, benefits, limitations, imponderables and alternatives regarding colonoscopy have been reviewed with the patient. Questions have been answered. All parties agreeable.     Notice:  This dictation was prepared with Dragon dictation along with smaller phrase technology. Any transcriptional errors that result from this process are unintentional and may not be corrected upon review.

## 2021-08-08 LAB — SURGICAL PATHOLOGY

## 2021-08-09 ENCOUNTER — Encounter: Payer: Self-pay | Admitting: Internal Medicine

## 2021-08-11 ENCOUNTER — Encounter (HOSPITAL_COMMUNITY): Payer: Self-pay | Admitting: Internal Medicine

## 2021-08-22 ENCOUNTER — Ambulatory Visit: Payer: BC Managed Care – PPO | Admitting: Nurse Practitioner

## 2021-08-29 ENCOUNTER — Ambulatory Visit: Payer: BC Managed Care – PPO | Admitting: Nurse Practitioner

## 2021-08-31 DIAGNOSIS — G4733 Obstructive sleep apnea (adult) (pediatric): Secondary | ICD-10-CM | POA: Diagnosis not present

## 2021-09-15 DIAGNOSIS — L821 Other seborrheic keratosis: Secondary | ICD-10-CM | POA: Diagnosis not present

## 2021-09-15 DIAGNOSIS — L918 Other hypertrophic disorders of the skin: Secondary | ICD-10-CM | POA: Diagnosis not present

## 2021-09-15 DIAGNOSIS — L820 Inflamed seborrheic keratosis: Secondary | ICD-10-CM | POA: Diagnosis not present

## 2021-09-17 ENCOUNTER — Ambulatory Visit: Payer: BC Managed Care – PPO | Admitting: Nurse Practitioner

## 2021-09-30 DIAGNOSIS — G4733 Obstructive sleep apnea (adult) (pediatric): Secondary | ICD-10-CM | POA: Diagnosis not present

## 2021-10-03 ENCOUNTER — Ambulatory Visit: Payer: BC Managed Care – PPO | Admitting: Nurse Practitioner

## 2021-10-07 ENCOUNTER — Encounter (HOSPITAL_COMMUNITY): Payer: Self-pay

## 2021-10-07 ENCOUNTER — Emergency Department (HOSPITAL_COMMUNITY): Payer: BC Managed Care – PPO

## 2021-10-07 ENCOUNTER — Emergency Department (HOSPITAL_COMMUNITY)
Admission: EM | Admit: 2021-10-07 | Discharge: 2021-10-07 | Disposition: A | Discharge status: Home / Self Care | Payer: BC Managed Care – PPO | Attending: Emergency Medicine | Admitting: Emergency Medicine

## 2021-10-07 ENCOUNTER — Other Ambulatory Visit: Payer: Self-pay

## 2021-10-07 ENCOUNTER — Telehealth: Payer: Self-pay | Admitting: Orthopedic Surgery

## 2021-10-07 DIAGNOSIS — S92015A Nondisplaced fracture of body of left calcaneus, initial encounter for closed fracture: Secondary | ICD-10-CM | POA: Diagnosis not present

## 2021-10-07 DIAGNOSIS — W108XXA Fall (on) (from) other stairs and steps, initial encounter: Secondary | ICD-10-CM | POA: Insufficient documentation

## 2021-10-07 DIAGNOSIS — M79672 Pain in left foot: Secondary | ICD-10-CM | POA: Diagnosis not present

## 2021-10-07 DIAGNOSIS — M7732 Calcaneal spur, left foot: Secondary | ICD-10-CM

## 2021-10-07 DIAGNOSIS — Z87891 Personal history of nicotine dependence: Secondary | ICD-10-CM | POA: Diagnosis not present

## 2021-10-07 MED ORDER — HYDROCODONE-ACETAMINOPHEN 5-325 MG PO TABS: ORAL_TABLET | ORAL | 0 refills | Status: DC

## 2021-10-07 MED ORDER — IBUPROFEN 800 MG PO TABS: 800.0000 mg | ORAL_TABLET | Freq: Three times a day (TID) | ORAL | 0 refills | Status: DC

## 2021-10-07 NOTE — Discharge Instructions (Signed)
The x-ray of your left foot today shows that you have a broken heel spur.  This is the likely source of your pain.  I recommend that you elevate and apply ice packs on and off to your foot.  Keep the foot splinted and use your crutches for walking or standing.  Call the podiatrist listed to arrange a follow-up appointment.

## 2021-10-07 NOTE — Telephone Encounter (Signed)
Patient called to request appointment for left heel spur/heel pain - states "broken"; please review and advise, as visit summary indicates referral to podiatry. Patient/wife also on phone states that he did schedule appointment with podiatrist, which is on 10/23/21.

## 2021-10-07 NOTE — ED Triage Notes (Signed)
Patient with complaints of left foot pain after sliding down a flight of steps this morning.

## 2021-10-07 NOTE — ED Provider Notes (Signed)
[facility] EMERGENCY DEPARTMENT Provider Note   CSN: [id number] Arrival date & time: 10/07/21  1314     History Chief Complaint  Patient presents with   Foot Injury    Jose Little is a 50 y.o. male.   Foot Injury Associated symptoms: no back pain, no fatigue, no fever and no neck pain        Jose Little is a 50 y.o. male who presents to the Emergency Department complaining of left heel pain since 4 AM this morning.  States that he slid down several steps landing on the heel of his left foot.  He has since had pain with any attempted weightbearing on his heel.  Pain radiates through to his mid calf area.  Pain resolves while at rest.  He denies any numbness or tingling of his foot, swelling, or injury to the forefoot.  No head injury or LOC.   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   Qualifier: Diagnosis of  By: Aline Brochure MD, Stanley     Rotator cuff tear    Sleep apnea     Patient Active Problem List   Diagnosis Date Noted   Former smoker 04/25/2021   Moderate obstructive sleep apnea 02/21/2021   Screening due 02/14/2021   Abdominal mass 02/14/2021   Muscle strain of upper back 02/14/2021   Routine medical exam 12/20/2020    Past Surgical History:  Procedure Laterality Date   ABDOMINAL SURGERY     BRAIN SURGERY     CHOLECYSTECTOMY     COLONOSCOPY N/A 08/06/2021   Procedure: COLONOSCOPY;  Surgeon: Daneil Dolin, MD;  Location: [facility] ENDO SUITE;  Service: Endoscopy;  Laterality: N/A;  ASA II / 7:30   POLYPECTOMY  08/06/2021   Procedure: POLYPECTOMY;  Surgeon: Daneil Dolin, MD;  Location: [facility] ENDO SUITE;  Service: Endoscopy;;   ROTATOR CUFF REPAIR     SINUS SURGERY WITH INSTATRAK         Family History  Problem Relation Age of Onset   Cancer Father    COPD Father    Heart failure Other    COPD Mother     Social History   Tobacco Use   Smoking status: Former    Packs/day: 2.00     Years: 43.00    Pack years: 86.00    Types: Cigarettes    Start date: 01/11/1988    Quit date: 12/15/2020    Years since quitting: 0.8   Smokeless tobacco: Current    Types: Chew  Vaping Use   Vaping Use: Never used  Substance Use Topics   Alcohol use: No   Drug use: No    Home Medications Prior to Admission medications   Medication Sig Start Date End Date Taking? Authorizing Provider  triamterene-hydrochlorothiazide (DYAZIDE) 37.5-25 MG capsule Take 1 each (1 capsule total) by mouth daily. 12/17/20 12/17/21  Deatra James, MD    Allergies    Patient has no known allergies.  Review of Systems   Review of Systems  Constitutional:  Negative for chills, fatigue and fever.  Respiratory:  Negative for shortness of breath.   Cardiovascular:  Negative for chest pain.  Gastrointestinal:  Negative for abdominal pain, nausea and vomiting.  Musculoskeletal:  Positive for arthralgias (Left heel pain). Negative for back pain, joint swelling and neck pain.  Skin:  Negative for color change, rash and wound.  Neurological:  Negative for dizziness, syncope,  weakness, numbness and headaches.  Hematological:  Does not bruise/bleed easily.  All other systems reviewed and are negative.  Physical Exam Updated Vital Signs BP (!) 145/97 (BP Location: Right Arm)    Pulse 70    Temp 98.4 F (36.9 C) (Oral)    Resp 18    Ht 5\' 9"  (1.753 m)    Wt 99.8 kg    SpO2 96%    BMI 32.49 kg/m   Physical Exam Vitals and nursing note reviewed.  Constitutional:      General: He is not in acute distress.    Appearance: Normal appearance. He is not ill-appearing.  HENT:     Head: Atraumatic.  Cardiovascular:     Rate and Rhythm: Normal rate and regular rhythm.     Pulses: Normal pulses.  Pulmonary:     Effort: Pulmonary effort is normal.     Breath sounds: Normal breath sounds.  Chest:     Chest wall: No tenderness.  Musculoskeletal:        General: Tenderness and signs of injury present. No  swelling or deformity. Normal range of motion.     Cervical back: Normal range of motion. No tenderness.     Comments: Focal tenderness to palpation of the plantar surface of the left heel.  No edema, erythema or open wound.  Achilles tendon appears intact.  No pain or edema of the left lower leg or ankle.  Skin:    General: Skin is warm.     Capillary Refill: Capillary refill takes less than 2 seconds.     Findings: No bruising, erythema or rash.  Neurological:     General: No focal deficit present.     Mental Status: He is alert.     Sensory: No sensory deficit.     Motor: No weakness.    ED Results / Procedures / Treatments   Labs (all labs ordered are listed, but only abnormal results are displayed) Labs Reviewed - No data to display  EKG None  Radiology DG Ankle Complete Left  Result Date: 10/07/2021 CLINICAL DATA:  Ankle pain EXAM: LEFT ANKLE COMPLETE - 3+ VIEW COMPARISON:  None. FINDINGS: No acute fracture or dislocation identified in the ankle. Ankle mortise is preserved. Small chronic appearing calcific density inferior to the medial malleolus may be degenerative or secondary to old injury. There is an acute nondisplaced fracture of a large plantar calcaneal spur. Posterior calcaneal spur also noted. No significant soft tissue swelling in the ankle. IMPRESSION: 1. Acute nondisplaced fracture of a plantar calcaneal spur. 2. No acute osseous abnormality in the ankle. Electronically Signed   By: Ofilia Neas M.D.   On: 10/07/2021 13:55   DG Foot Complete Left  Result Date: 10/07/2021 CLINICAL DATA:  Foot pain EXAM: LEFT FOOT - COMPLETE 3+ VIEW COMPARISON:  None. FINDINGS: There is an acute nondisplaced fracture of a large plantar calcaneal spur. No other acute fracture or dislocation identified in the foot. Mild hallux valgus deformity with associated degenerative changes at the first MTP joint. IMPRESSION: Acute nondisplaced fracture of a large plantar calcaneal spur.  Electronically Signed   By: Ofilia Neas M.D.   On: 10/07/2021 13:52    Procedures Procedures   Medications Ordered in ED Medications - No data to display  ED Course  I have reviewed the triage vital signs and the nursing notes.  Pertinent labs & imaging results that were available during my care of the patient were reviewed by me and considered in  my medical decision making (see chart for details).    MDM Rules/Calculators/A&P                         Patient here for evaluation of mechanical injury to the left foot that occurred earlier this morning.  He reports falling down several steps landing on the heel of his left foot.  Since the fall, he reports pain with any attempted weightbearing to his left heel.  No other reported injuries.  On exam, focal tenderness palpation of the plantar surface of the left heel.  No open wounds, erythema or edema.  Achilles tendon appears intact.  Good cap refill and palpable DP pulses.  Ankle and forefoot are nontender.  Patient agreeable to treatment plan with [facility] and close podiatry follow-up.  He will be placed in posterior splint and crutches given as well as short course of pain medication and NSAID  He appears appropriate for discharge home, all questions answered.       Final Clinical Impression(s) / ED Diagnoses Final diagnoses:  Pain of left heel  Calcaneal spur, left    Rx / DC Orders ED Discharge Orders     None        Kem Parkinson, PA-C 10/07/21 1528    Daleen Bo, MD 10/08/21 1135

## 2021-10-16 ENCOUNTER — Encounter: Payer: Self-pay | Admitting: Orthopedic Surgery

## 2021-10-16 ENCOUNTER — Other Ambulatory Visit: Payer: Self-pay

## 2021-10-16 ENCOUNTER — Ambulatory Visit: Payer: BC Managed Care – PPO | Admitting: Orthopedic Surgery

## 2021-10-16 VITALS — BP 157/88 | HR 70 | Ht 68.0 in | Wt 220.0 lb

## 2021-10-16 DIAGNOSIS — S93692A Other sprain of left foot, initial encounter: Secondary | ICD-10-CM

## 2021-10-16 MED ORDER — HYDROCODONE-ACETAMINOPHEN 5-325 MG PO TABS: ORAL_TABLET | ORAL | 0 refills | Status: DC

## 2021-10-16 NOTE — Patient Instructions (Signed)
Jan 11 start trying to walk with the boot and the crutches   See me jan 26th

## 2021-10-16 NOTE — Progress Notes (Signed)
°  Chief Complaint  Patient presents with   Foot Injury    10/07/21 left foot vs cinderblock / kicked to move it and has had pain since bottom of left foot     Encounter Diagnosis  Name Primary?   Rupture of plantar fascia of left foot, initial encounter Yes    Jose Little is here from the emergency room for follow-up  He kicked a cinderblock and injured his left heel with severe pain and swelling and inability to weight-bear.  Date of injury was December 27  Has been on crutches and has been taking hydrocodone for pain  He did have some heel pain prior to his injury which she describes as pain in the beginning of the day which eased as he walked on it and then returned when he got out of bed in the morning  Review of the medical history is only significant for sleep apnea previous brain tumor arthritis of the knee rotator cuff tear  He did smoke but quit in March 2022 and he has had some surgeries which can be reviewed in the record  BP (!) 157/88    Pulse 70    Ht 5\' 8"  (1.727 m)    Wt 220 lb (99.8 kg)    BMI 33.45 kg/m   His appearance is normal  He is oriented x3  He has excellent mood normal affect  He is walking with crutches nonweightbearing  He has tenderness at the plantar insertion on the left foot normal range of motion of the ankle with good stability noted no atrophy skin is normal pulses are good sensation is intact  Personally reviewed his x-ray and it shows: X-ray shows a fracture of a small to medium sized plantar spur   Encounter Diagnosis  Name Primary?   Rupture of plantar fascia of left foot, initial encounter Yes   I recommend he continue his nonweightbearing status until January 11 then he can start weightbearing with a short cam walker with his crutches  I will see him January 26 to see where he is at I expect this to take about 6 weeks    Meds ordered this encounter  Medications   HYDROcodone-acetaminophen (NORCO/VICODIN) 5-325 MG tablet     Sig: Take one tab po q 4 hrs prn pain    Dispense:  10 tablet    Refill:  0

## 2021-10-31 DIAGNOSIS — G4733 Obstructive sleep apnea (adult) (pediatric): Secondary | ICD-10-CM | POA: Diagnosis not present

## 2021-11-06 ENCOUNTER — Ambulatory Visit (INDEPENDENT_AMBULATORY_CARE_PROVIDER_SITE_OTHER): Payer: BC Managed Care – PPO | Admitting: Orthopedic Surgery

## 2021-11-06 ENCOUNTER — Other Ambulatory Visit: Payer: Self-pay

## 2021-11-06 DIAGNOSIS — S92902D Unspecified fracture of left foot, subsequent encounter for fracture with routine healing: Secondary | ICD-10-CM

## 2021-11-06 NOTE — Progress Notes (Signed)
Chief Complaint  Patient presents with   Foot Injury    DOI 10/07/21 slid down steps   Ankle Injury     Mr. Bloyd basically fractured her plantar spur he has used a follow-up with me in 3 weeks for 3 weeks has been able to work but hurts at the end of the day and when he is walking barefoot  The swelling is down the foot is less tender especially over the plantar fascia  Recommend continue weightbearing as tolerated with a cam walker and follow-up in 3 weeks

## 2021-11-07 ENCOUNTER — Ambulatory Visit: Payer: BC Managed Care – PPO

## 2021-11-27 ENCOUNTER — Ambulatory Visit (INDEPENDENT_AMBULATORY_CARE_PROVIDER_SITE_OTHER): Payer: BC Managed Care – PPO | Admitting: Orthopedic Surgery

## 2021-11-27 ENCOUNTER — Other Ambulatory Visit: Payer: Self-pay

## 2021-11-27 ENCOUNTER — Encounter: Payer: Self-pay | Admitting: Orthopedic Surgery

## 2021-11-27 DIAGNOSIS — S93692D Other sprain of left foot, subsequent encounter: Secondary | ICD-10-CM

## 2021-11-27 DIAGNOSIS — S92902D Unspecified fracture of left foot, subsequent encounter for fracture with routine healing: Secondary | ICD-10-CM | POA: Diagnosis not present

## 2021-11-27 NOTE — Progress Notes (Signed)
Follow-up visit for [facility] complaint injury left heel  The patient fractured the bone spur of his plantar fascial insertion  After several weeks in a cam walking boot he is now asymptomatic  His exam proves that out as there is no palpable tenderness and there is no defect  The patient is released to follow-up as needed  Encounter Diagnoses  Name Primary?   Closed fracture of left foot with routine healing, subsequent encounter Yes   Rupture of plantar fascia of left foot, subsequent encounter

## 2021-12-01 DIAGNOSIS — G4733 Obstructive sleep apnea (adult) (pediatric): Secondary | ICD-10-CM | POA: Diagnosis not present

## 2022-03-31 ENCOUNTER — Ambulatory Visit: Payer: BC Managed Care – PPO | Admitting: Pulmonary Disease

## 2022-04-27 ENCOUNTER — Other Ambulatory Visit: Payer: Self-pay

## 2022-04-27 ENCOUNTER — Emergency Department (HOSPITAL_COMMUNITY)
Admission: EM | Admit: 2022-04-27 | Discharge: 2022-04-27 | Disposition: A | Discharge status: Home / Self Care | Payer: 59 | Attending: Emergency Medicine | Admitting: Emergency Medicine

## 2022-04-27 ENCOUNTER — Encounter (HOSPITAL_COMMUNITY): Payer: Self-pay | Admitting: *Deleted

## 2022-04-27 DIAGNOSIS — R600 Localized edema: Secondary | ICD-10-CM

## 2022-04-27 DIAGNOSIS — R609 Edema, unspecified: Secondary | ICD-10-CM | POA: Diagnosis not present

## 2022-04-27 DIAGNOSIS — R2243 Localized swelling, mass and lump, lower limb, bilateral: Secondary | ICD-10-CM | POA: Diagnosis present

## 2022-04-27 LAB — BASIC METABOLIC PANEL
Anion gap: 4 — ABNORMAL LOW (ref 5–15)
BUN: 18 mg/dL (ref 6–20)
CO2: 27 mmol/L (ref 22–32)
Calcium: 9 mg/dL (ref 8.9–10.3)
Chloride: 112 mmol/L — ABNORMAL HIGH (ref 98–111)
Creatinine, Ser: 0.97 mg/dL (ref 0.61–1.24)
GFR, Estimated: >60 mL/min (ref >60)
Glucose, Bld: 97 mg/dL (ref 70–99)
Potassium: 4.1 mmol/L (ref 3.5–5.1)
Sodium: 143 mmol/L (ref 135–145)

## 2022-04-27 LAB — CBC WITH DIFFERENTIAL/PLATELET
Abs Immature Granulocytes: 0.03 10*3/uL (ref 0.00–0.07)
Basophils Absolute: 0.1 10*3/uL (ref 0.0–0.1)
Basophils Relative: 1 %
Eosinophils Absolute: 0.2 10*3/uL (ref 0.0–0.5)
Eosinophils Relative: 2 %
HCT: 45.8 % (ref 39.0–52.0)
Hemoglobin: 15.3 g/dL (ref 13.0–17.0)
Immature Granulocytes: 0 %
Lymphocytes Relative: 32 %
Lymphs Abs: 2.5 10*3/uL (ref 0.7–4.0)
MCH: 29.8 pg (ref 26.0–34.0)
MCHC: 33.4 g/dL (ref 30.0–36.0)
MCV: 89.3 fL (ref 80.0–100.0)
Monocytes Absolute: 0.6 10*3/uL (ref 0.1–1.0)
Monocytes Relative: 8 %
Neutro Abs: 4.5 10*3/uL (ref 1.7–7.7)
Neutrophils Relative %: 57 %
Platelets: 223 10*3/uL (ref 150–400)
RBC: 5.13 MIL/uL (ref 4.22–5.81)
RDW: 14.1 % (ref 11.5–15.5)
WBC: 7.8 10*3/uL (ref 4.0–10.5)
nRBC: 0 % (ref 0.0–0.2)

## 2022-04-27 LAB — TROPONIN I (HIGH SENSITIVITY)
Troponin I (High Sensitivity): 3 ng/L (ref <18)
Troponin I (High Sensitivity): 2 ng/L (ref <18)

## 2022-04-27 MED ORDER — FUROSEMIDE 20 MG PO TABS: 20.0000 mg | ORAL_TABLET | Freq: Every day | ORAL | 0 refills | Status: DC

## 2022-04-27 NOTE — ED Notes (Signed)
Pt ambulatory to treatment room with no distress noted.

## 2022-04-27 NOTE — ED Triage Notes (Addendum)
Pt believes he may have had a heat stroke Thursday, Pt with mid CP last Thursday and went away on it's own.  Then with bilateral leg swelling that Thursday night.  Denies fevers or n/v/D or SOB

## 2022-04-27 NOTE — ED Provider Notes (Signed)
[facility] EMERGENCY DEPARTMENT Provider Note   CSN: [id number] Arrival date & time: 04/27/22  1853     History  Chief Complaint  Patient presents with   Leg Swelling    Jose Little is a 51 y.o. male.  Patient presents to the emergency department for evaluation of swelling of his legs.  He has noticed swelling of both of his legs since he was exposed to heat at his job several days ago.  He is noticing most of the swelling around his knees and he has some intermittent tingling and burning pain in his thighs.  No associated shortness of breath.       Home Medications Prior to Admission medications   Medication Sig Start Date End Date Taking? Authorizing Provider  furosemide (LASIX) 20 MG tablet Take 1 tablet (20 mg total) by mouth daily. 04/27/22  Yes Rosette Bellavance, Gwenyth Allegra, MD  triamterene-hydrochlorothiazide (DYAZIDE) 37.5-25 MG capsule Take 1 each (1 capsule total) by mouth daily. Patient not taking: Reported on 11/27/2021 12/17/20 12/17/21  Deatra James, MD      Allergies    Patient has no known allergies.    Review of Systems   Review of Systems  Physical Exam Updated Vital Signs BP (!) 155/93   Pulse (!) 58   Temp 97.7 F (36.5 C) (Oral)   Resp 17   Ht '5\' 9"'$  (1.753 m)   Wt 99.8 kg   SpO2 98%   BMI 32.49 kg/m  Physical Exam Vitals and nursing note reviewed.  Constitutional:      General: He is not in acute distress.    Appearance: He is well-developed.  HENT:     Head: Normocephalic and atraumatic.     Mouth/Throat:     Mouth: Mucous membranes are moist.  Eyes:     General: Vision grossly intact. Gaze aligned appropriately.     Extraocular Movements: Extraocular movements intact.     Conjunctiva/sclera: Conjunctivae normal.  Cardiovascular:     Rate and Rhythm: Normal rate and regular rhythm.     Pulses: Normal pulses.     Heart sounds: Normal heart sounds, S1 normal and S2 normal. No murmur heard.    No friction rub. No gallop.  Pulmonary:      Effort: Pulmonary effort is normal. No respiratory distress.     Breath sounds: Normal breath sounds.  Abdominal:     Palpations: Abdomen is soft.     Tenderness: There is no abdominal tenderness. There is no guarding or rebound.     Hernia: No hernia is present.  Musculoskeletal:        General: No swelling.     Cervical back: Full passive range of motion without pain, normal range of motion and neck supple. No pain with movement, spinous process tenderness or muscular tenderness. Normal range of motion.     Right lower leg: Edema present.     Left lower leg: Edema present.  Skin:    General: Skin is warm and dry.     Capillary Refill: Capillary refill takes less than 2 seconds.     Findings: No ecchymosis, erythema, lesion or wound.  Neurological:     Mental Status: He is alert and oriented to person, place, and time.     GCS: GCS eye subscore is 4. GCS verbal subscore is 5. GCS motor subscore is 6.     Cranial Nerves: Cranial nerves 2-12 are intact.     Sensory: Sensation is intact.  Motor: Motor function is intact. No weakness or abnormal muscle tone.     Coordination: Coordination is intact.  Psychiatric:        Mood and Affect: Mood normal.        Speech: Speech normal.        Behavior: Behavior normal.     ED Results / Procedures / Treatments   Labs (all labs ordered are listed, but only abnormal results are displayed) Labs Reviewed  BASIC METABOLIC PANEL - Abnormal; Notable for the following components:      Result Value   Chloride 112 (*)    Anion gap 4 (*)    All other components within normal limits  CBC WITH DIFFERENTIAL/PLATELET  TROPONIN I (HIGH SENSITIVITY)  TROPONIN I (HIGH SENSITIVITY)    EKG EKG Interpretation  Date/Time:  Monday April 27 2022 19:28:05 EDT Ventricular Rate:  80 PR Interval:  124 QRS Duration: 78 QT Interval:  366 QTC Calculation: 422 R Axis:   55 Text Interpretation: Normal sinus rhythm Normal ECG Confirmed by Orpah Greek [phone]) on 04/27/2022 10:57:09 PM  Radiology No results found.  Procedures Procedures    Medications Ordered in ED Medications - No data to display  ED Course/ Medical Decision Making/ A&P                           Medical Decision Making Amount and/or Complexity of Data Reviewed Labs: ordered.   Presents with bilateral lower extremity swelling.  Differential diagnosis includes kidney failure, congestive heart failure, DVT, dependent edema.  Examination reveals trace edema that is nonpitting in nature.  Symptoms are symmetric.  No calf tenderness or increased circumference of calves.  Negative Homans' sign.  Doubt DVT.  Patient has normal strength and sensation of both lower extremities, doubt cauda equina syndrome or neurosurgical emergency.  No overlying skin infection signs.  Renal function is normal.  Symptoms most consistent with dependent edema.  Rest, 2 days of Lasix.  Follow-up with primary care.        Final Clinical Impression(s) / ED Diagnoses Final diagnoses:  Leg edema    Rx / DC Orders ED Discharge Orders          Ordered    furosemide (LASIX) 20 MG tablet  Daily        04/27/22 2314              Orpah Greek, MD 04/27/22 2314

## 2022-04-29 ENCOUNTER — Ambulatory Visit: Payer: 59 | Admitting: Internal Medicine

## 2022-04-29 ENCOUNTER — Encounter: Payer: Self-pay | Admitting: Internal Medicine

## 2022-04-29 VITALS — BP 136/72 | HR 63 | Ht 69.0 in | Wt 220.8 lb

## 2022-04-29 DIAGNOSIS — I10 Essential (primary) hypertension: Secondary | ICD-10-CM

## 2022-04-29 DIAGNOSIS — M7989 Other specified soft tissue disorders: Secondary | ICD-10-CM | POA: Diagnosis not present

## 2022-04-29 DIAGNOSIS — Z0001 Encounter for general adult medical examination with abnormal findings: Secondary | ICD-10-CM | POA: Insufficient documentation

## 2022-04-29 DIAGNOSIS — E782 Mixed hyperlipidemia: Secondary | ICD-10-CM | POA: Diagnosis not present

## 2022-04-29 DIAGNOSIS — Z09 Encounter for follow-up examination after completed treatment for conditions other than malignant neoplasm: Secondary | ICD-10-CM | POA: Diagnosis not present

## 2022-04-29 DIAGNOSIS — S76312A Strain of muscle, fascia and tendon of the posterior muscle group at thigh level, left thigh, initial encounter: Secondary | ICD-10-CM | POA: Insufficient documentation

## 2022-04-29 DIAGNOSIS — R7303 Prediabetes: Secondary | ICD-10-CM

## 2022-04-29 DIAGNOSIS — G4733 Obstructive sleep apnea (adult) (pediatric): Secondary | ICD-10-CM

## 2022-04-29 DIAGNOSIS — E559 Vitamin D deficiency, unspecified: Secondary | ICD-10-CM

## 2022-04-29 MED ORDER — CYCLOBENZAPRINE HCL 5 MG PO TABS: 5.0000 mg | ORAL_TABLET | Freq: Two times a day (BID) | ORAL | 1 refills | Status: DC | PRN

## 2022-04-29 MED ORDER — TRIAMTERENE-HCTZ 37.5-25 MG PO CAPS: 1.0000 | ORAL_CAPSULE | Freq: Every day | ORAL | 1 refills | Status: DC

## 2022-04-29 NOTE — Assessment & Plan Note (Signed)
BP Readings from Last 1 Encounters:  04/29/22 136/72   Well-controlled today, likely due to Lasix Restarted triamterene-HCTZ instead of Lasix Counseled for compliance with the medications Advised DASH diet and moderate exercise/walking, at least 150 mins/week

## 2022-04-29 NOTE — Assessment & Plan Note (Signed)
Left thigh posterior aspect pain likely due to hamstring strain Flexeril as needed for muscle spasms Rest and leg elevation Work note provided

## 2022-04-29 NOTE — Patient Instructions (Addendum)
Please take Flexeril for muscle spasms.  Please take Maxzide for BP.  Please follow low salt diet and ambulate as tolerated.  Please get fasting blood tests done before the next visit.

## 2022-04-29 NOTE — Assessment & Plan Note (Signed)
Improved with Lasix Advised to follow low-salt diet Leg elevation Restarted triamterene-HCTZ for HTN

## 2022-04-29 NOTE — Progress Notes (Signed)
Acute Office Visit  Subjective:    Patient ID: Jose Little, male    DOB: 08-Jun-1971, 51 y.o.   MRN: [id number]  Chief Complaint  Patient presents with   Follow-up    ED follow up from 07/17, c/o swelling in both legs, needs to repeat lab work. Out of his bp medication.     HPI Patient is in today for ER visit follow-up.  He went to ER for left > right leg swelling.  She reported heat exposure at work few days ago, after which he had gradually worsening leg swelling.  He denied any dyspnea or chest pain.  Of note, he was on triamterene-HCTZ for HTN in the past, but has run out of it for many months.  He denies any headache, dizziness, chest pain, dyspnea or palpitations.  He uses CPAP for OSA.  He also complains of pain behind lower thigh and knee for the last 2 days.  Denies any recent injury or fall.  He does report exertional work at his workplace.  He denies any numbness or tingling of the LE.  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   Qualifier: Diagnosis of  By: Aline Brochure MD, Stanley     Rotator cuff tear    Sleep apnea     Past Surgical History:  Procedure Laterality Date   ABDOMINAL SURGERY     BRAIN SURGERY     CHOLECYSTECTOMY     COLONOSCOPY N/A 08/06/2021   Procedure: COLONOSCOPY;  Surgeon: Daneil Dolin, MD;  Location: [facility] ENDO SUITE;  Service: Endoscopy;  Laterality: N/A;  ASA II / 7:30   POLYPECTOMY  08/06/2021   Procedure: POLYPECTOMY;  Surgeon: Daneil Dolin, MD;  Location: [facility] ENDO SUITE;  Service: Endoscopy;;   ROTATOR CUFF REPAIR     SINUS SURGERY WITH INSTATRAK      Family History  Problem Relation Age of Onset   Cancer Father    COPD Father    Heart failure Other    COPD Mother     Social History   Socioeconomic History   Marital status: Married    Spouse name: Not on file   Number of children: Not on file   Years of education: Not on file   Highest education level: Not on  file  Occupational History   Occupation: Associate Professor for [employer], [employer]  Tobacco Use   Smoking status: Former    Packs/day: 2.00    Years: 43.00    Total pack years: 86.00    Types: Cigarettes    Start date: 01/11/1988    Quit date: 12/15/2020    Years since quitting: 1.3   Smokeless tobacco: Former    Types: Nurse, children's Use: Never used  Substance and Sexual Activity   Alcohol use: No   Drug use: No   Sexual activity: Yes  Other Topics Concern   Not on file  Social History Narrative   Not on file   Social Determinants of Health   Financial Resource Strain: Not on file  Food Insecurity: Not on file  Transportation Needs: Not on file  Physical Activity: Not on file  Stress: Not on file  Social Connections: Not on file  Intimate Partner Violence: Not on file    Outpatient Medications Prior to Visit  Medication Sig Dispense Refill   furosemide (LASIX) 20 MG tablet Take 1 tablet (20 mg total)  by mouth daily. 3 tablet 0   triamterene-hydrochlorothiazide (DYAZIDE) 37.5-25 MG capsule Take 1 each (1 capsule total) by mouth daily. (Patient not taking: Reported on 11/27/2021) 30 capsule 2   No facility-administered medications prior to visit.    No Known Allergies  Review of Systems  Constitutional:  Negative for chills and fever.  HENT:  Negative for congestion and sore throat.   Eyes:  Negative for pain and discharge.  Respiratory:  Negative for cough and shortness of breath.   Cardiovascular:  Positive for leg swelling. Negative for chest pain and palpitations.  Gastrointestinal:  Negative for diarrhea, nausea and vomiting.  Endocrine: Negative for polydipsia and polyuria.  Genitourinary:  Negative for dysuria and hematuria.  Musculoskeletal:  Negative for neck pain and neck stiffness.       Left thigh pain  Skin:  Negative for rash.  Neurological:  Negative for dizziness, weakness, numbness and headaches.  Psychiatric/Behavioral:  Negative  for agitation and behavioral problems.        Objective:    Physical Exam Vitals reviewed.  Constitutional:      General: He is not in acute distress.    Appearance: He is obese. He is not diaphoretic.  HENT:     Head: Normocephalic and atraumatic.     Nose: Nose normal.     Mouth/Throat:     Mouth: Mucous membranes are moist.  Eyes:     General: No scleral icterus.    Extraocular Movements: Extraocular movements intact.  Cardiovascular:     Rate and Rhythm: Normal rate and regular rhythm.     Pulses: Normal pulses.     Heart sounds: Normal heart sounds. No murmur heard. Pulmonary:     Breath sounds: Normal breath sounds. No wheezing or rales.  Abdominal:     Palpations: Abdomen is soft.     Tenderness: There is no abdominal tenderness.  Musculoskeletal:     Cervical back: Neck supple. No tenderness.     Right lower leg: No edema.     Left lower leg: Edema (Mild) present.  Skin:    General: Skin is warm.     Findings: No rash.  Neurological:     General: No focal deficit present.     Mental Status: He is alert and oriented to person, place, and time.  Psychiatric:        Mood and Affect: Mood normal.        Behavior: Behavior normal.     BP 136/72 (BP Location: Left Arm)   Pulse 63   Ht 5' 9"  (1.753 m)   Wt 220 lb 12.8 oz (100.2 kg)   SpO2 96%   BMI 32.61 kg/m  Wt Readings from Last 3 Encounters:  04/29/22 220 lb 12.8 oz (100.2 kg)  04/27/22 220 lb (99.8 kg)  10/16/21 220 lb (99.8 kg)        Assessment & Plan:   Problem List Items Addressed This Visit       Cardiovascular and Mediastinum   Essential hypertension    BP Readings from Last 1 Encounters:  04/29/22 136/72  Well-controlled today, likely due to Lasix Restarted triamterene-HCTZ instead of Lasix Counseled for compliance with the medications Advised DASH diet and moderate exercise/walking, at least 150 mins/week        Relevant Medications   triamterene-hydrochlorothiazide (DYAZIDE)  37.5-25 MG capsule   Other Relevant Orders   TSH   CMP14+EGFR   CBC with Differential/Platelet     Respiratory   Moderate  obstructive sleep apnea    Uses CPAP regularly      Relevant Orders   TSH     Musculoskeletal and Integument   Hamstring strain, left, initial encounter    Left thigh posterior aspect pain likely due to hamstring strain Flexeril as needed for muscle spasms Rest and leg elevation Work note provided      Relevant Medications   cyclobenzaprine (FLEXERIL) 5 MG tablet     Other   Mixed hyperlipidemia   Relevant Medications   triamterene-hydrochlorothiazide (DYAZIDE) 37.5-25 MG capsule   Other Relevant Orders   Lipid panel   Encounter for examination following treatment at hospital - Primary    ER chart reviewed, including blood test results Leg swelling improved with Lasix      Relevant Orders   CMP14+EGFR   CBC with Differential/Platelet   Leg swelling    Improved with Lasix Advised to follow low-salt diet Leg elevation Restarted triamterene-HCTZ for HTN      Relevant Medications   triamterene-hydrochlorothiazide (DYAZIDE) 37.5-25 MG capsule   Other Relevant Orders   CMP14+EGFR   CBC with Differential/Platelet   Other Visit Diagnoses     Prediabetes       Relevant Orders   Hemoglobin A1c   Vitamin D deficiency       Relevant Orders   VITAMIN D 25 Hydroxy (Vit-D Deficiency, Fractures)        Meds ordered this encounter  Medications   triamterene-hydrochlorothiazide (DYAZIDE) 37.5-25 MG capsule    Sig: Take 1 each (1 capsule total) by mouth daily.    Dispense:  90 capsule    Refill:  1   cyclobenzaprine (FLEXERIL) 5 MG tablet    Sig: Take 1 tablet (5 mg total) by mouth 2 (two) times daily as needed for muscle spasms.    Dispense:  30 tablet    Refill:  1     Tayari Yankee Keith Rake, MD

## 2022-04-29 NOTE — Assessment & Plan Note (Signed)
ER chart reviewed, including blood test results Leg swelling improved with Lasix

## 2022-04-29 NOTE — Assessment & Plan Note (Signed)
Uses CPAP regularly °

## 2022-05-16 IMAGING — DX DG CHEST 1V PORT
1 series · 2 of 2 positions shown · non-contrast
Comparison: Radiograph 07/29/2019

CLINICAL DATA: Cough, fever, chest pain and congestion

EXAM:
PORTABLE CHEST 1 VIEW

[Series 1: chest ap · 0.14mm/px · 2 of 2 slices shown]
[im 1/2]
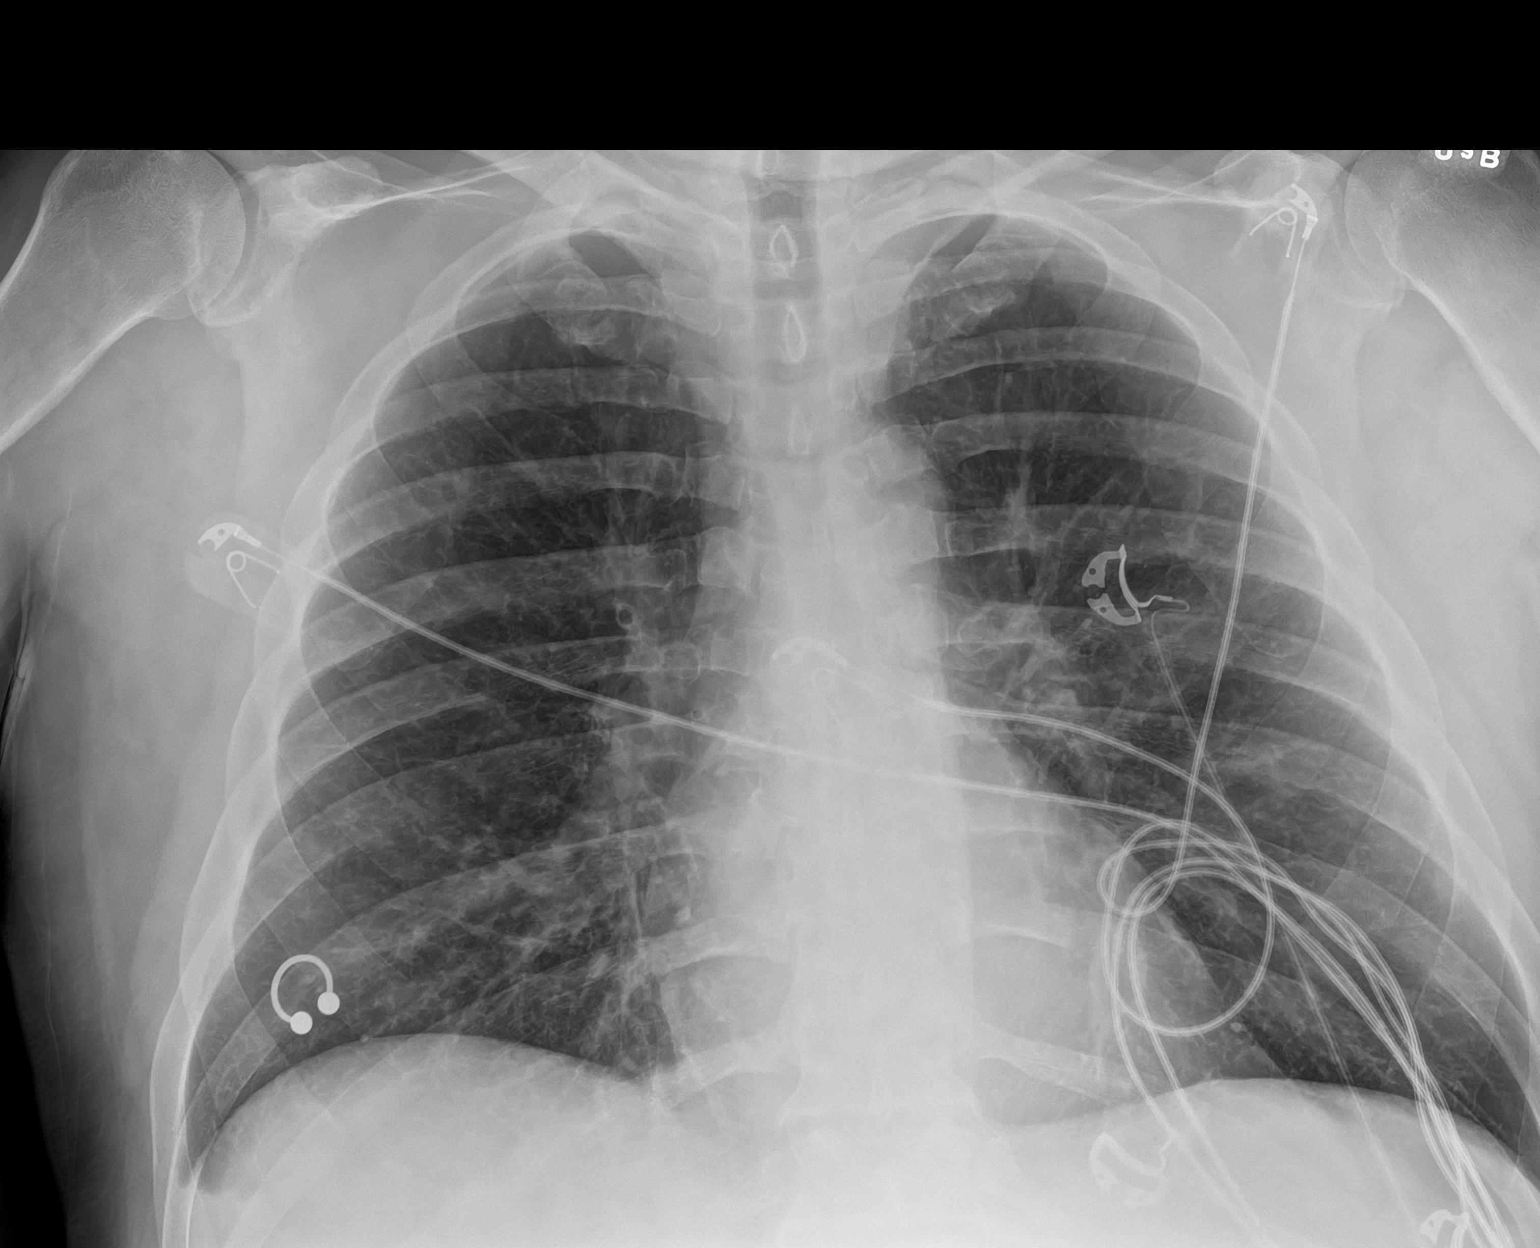
[im 2/2]
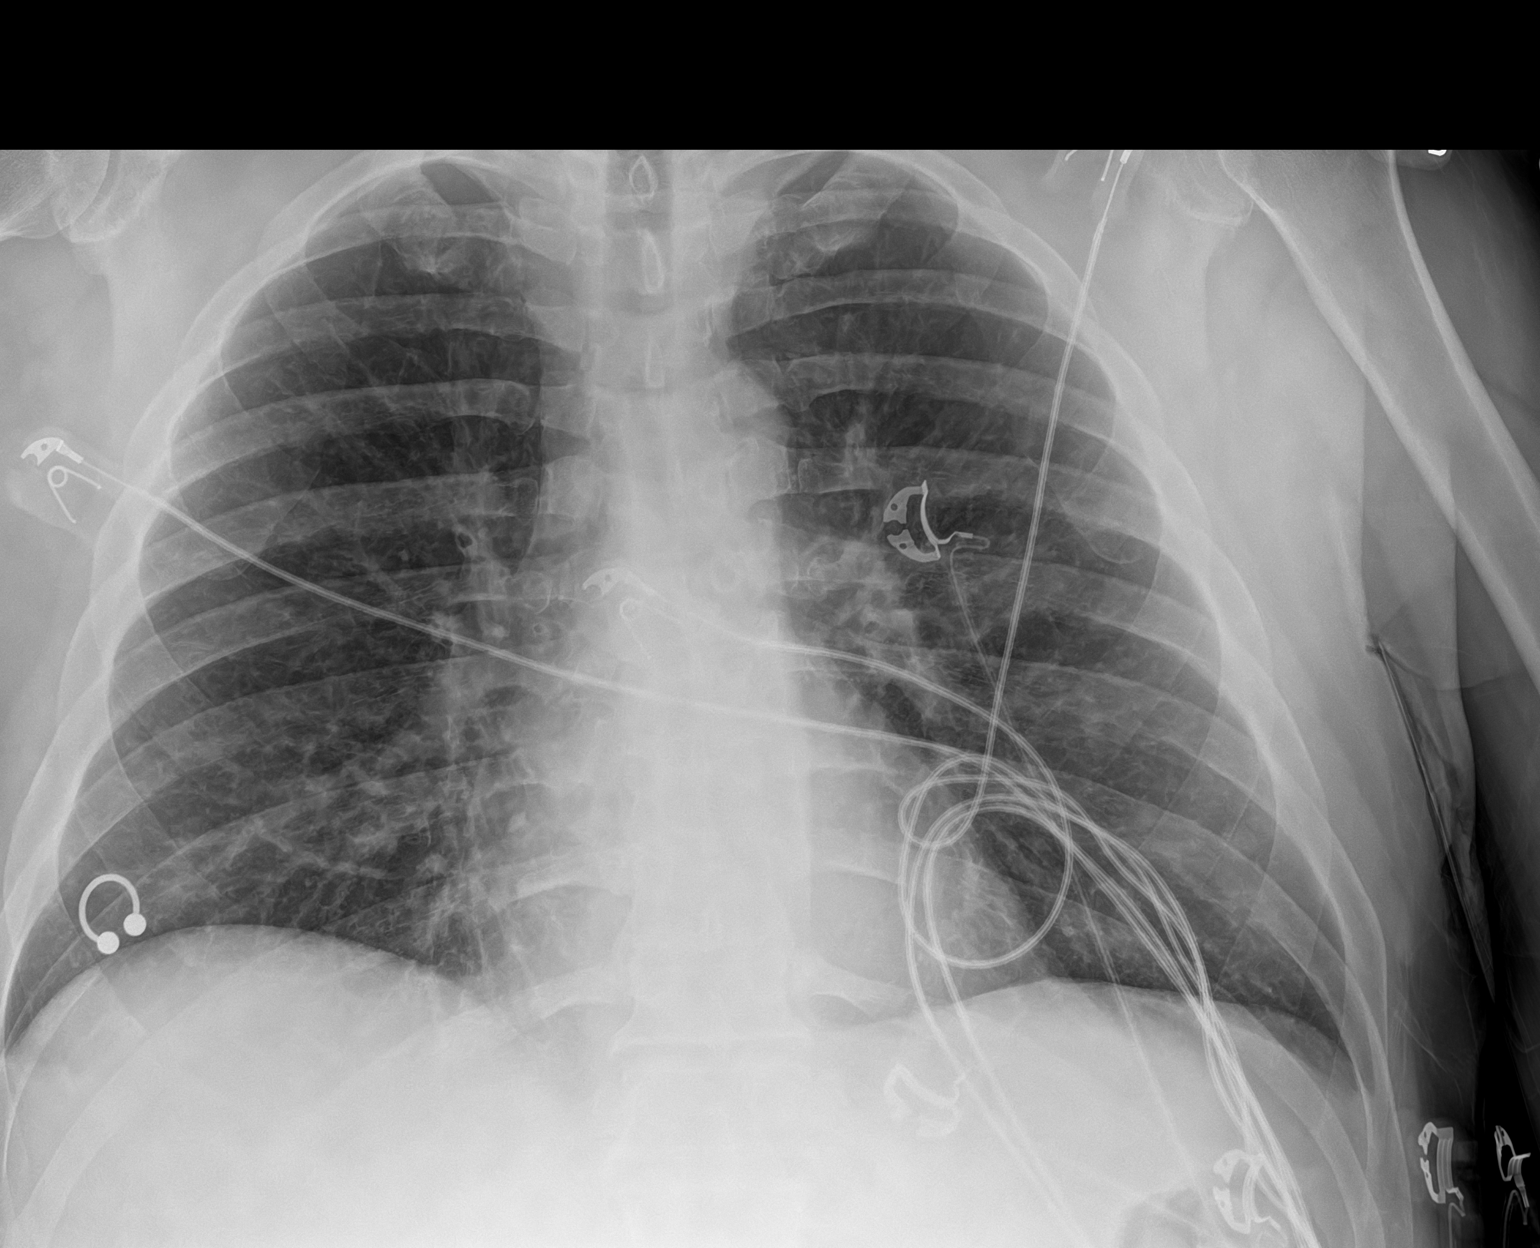

[2 of 2 positions shown; findings below may reference images not displayed]

FINDINGS: No consolidation, features of edema, pneumothorax, or effusion.
Pulmonary vascularity is normally distributed. The cardiomediastinal
contours are unremarkable. No acute osseous or soft tissue
abnormality. Degenerative changes are present in the imaged spine
and shoulders. Telemetry leads overlie the chest. Metallic nipple
ring projects over the right chest wall.
IMPRESSION: No acute cardiopulmonary abnormality.

## 2022-05-26 ENCOUNTER — Other Ambulatory Visit: Payer: Self-pay | Admitting: Internal Medicine

## 2022-05-26 DIAGNOSIS — S76312A Strain of muscle, fascia and tendon of the posterior muscle group at thigh level, left thigh, initial encounter: Secondary | ICD-10-CM

## 2022-08-25 LAB — CBC WITH DIFFERENTIAL/PLATELET
Basophils Absolute: 0.1 10*3/uL (ref 0.0–0.2)
Basos: 1 %
EOS (ABSOLUTE): 0.5 10*3/uL — ABNORMAL HIGH (ref 0.0–0.4)
Eos: 5 %
Hematocrit: 42.3 % (ref 37.5–51.0)
Hemoglobin: 14.8 g/dL (ref 13.0–17.7)
Immature Grans (Abs): 0 10*3/uL (ref 0.0–0.1)
Immature Granulocytes: 1 %
Lymphocytes Absolute: 2.6 10*3/uL (ref 0.7–3.1)
Lymphs: 29 %
MCH: 30.7 pg (ref 26.6–33.0)
MCHC: 35 g/dL (ref 31.5–35.7)
MCV: 88 fL (ref 79–97)
Monocytes Absolute: 0.6 10*3/uL (ref 0.1–0.9)
Monocytes: 7 %
Neutrophils Absolute: 5.1 10*3/uL (ref 1.4–7.0)
Neutrophils: 57 %
Platelets: 261 10*3/uL (ref 150–450)
RBC: 4.82 x10E6/uL (ref 4.14–5.80)
RDW: 13.3 % (ref 11.6–15.4)
WBC: 8.9 10*3/uL (ref 3.4–10.8)

## 2022-08-25 LAB — CMP14+EGFR
ALT: 16 IU/L (ref 0–44)
AST: 15 IU/L (ref 0–40)
Albumin/Globulin Ratio: 2.9 — ABNORMAL HIGH (ref 1.2–2.2)
Albumin: 4.7 g/dL (ref 4.1–5.1)
Alkaline Phosphatase: 82 IU/L (ref 44–121)
BUN/Creatinine Ratio: 21 — ABNORMAL HIGH (ref 9–20)
BUN: 26 mg/dL — ABNORMAL HIGH (ref 6–24)
Bilirubin Total: <0.2 mg/dL (ref 0.0–1.2)
CO2: 27 mmol/L (ref 20–29)
Calcium: 8.9 mg/dL (ref 8.7–10.2)
Chloride: 102 mmol/L (ref 96–106)
Creatinine, Ser: 1.24 mg/dL (ref 0.76–1.27)
Globulin, Total: 1.6 g/dL (ref 1.5–4.5)
Glucose: 85 mg/dL (ref 70–99)
Potassium: 4 mmol/L (ref 3.5–5.2)
Sodium: 141 mmol/L (ref 134–144)
Total Protein: 6.3 g/dL (ref 6.0–8.5)
eGFR: 71 mL/min/{1.73_m2} (ref >59)

## 2022-08-25 LAB — HEMOGLOBIN A1C
Est. average glucose Bld gHb Est-mCnc: 108 mg/dL
Hgb A1c MFr Bld: 5.4 % (ref 4.8–5.6)

## 2022-08-25 LAB — LIPID PANEL
Chol/HDL Ratio: 6.8 ratio — ABNORMAL HIGH (ref 0.0–5.0)
Cholesterol, Total: 176 mg/dL (ref 100–199)
HDL: 26 mg/dL — ABNORMAL LOW (ref >39)
LDL Chol Calc (NIH): 102 mg/dL — ABNORMAL HIGH (ref 0–99)
Triglycerides: 283 mg/dL — ABNORMAL HIGH (ref 0–149)
VLDL Cholesterol Cal: 48 mg/dL — ABNORMAL HIGH (ref 5–40)

## 2022-08-25 LAB — VITAMIN D 25 HYDROXY (VIT D DEFICIENCY, FRACTURES): Vit D, 25-Hydroxy: 32.3 ng/mL (ref 30.0–100.0)

## 2022-08-25 LAB — TSH: TSH: 3.42 u[IU]/mL (ref 0.450–4.500)

## 2022-08-28 ENCOUNTER — Encounter: Payer: 59 | Admitting: Family Medicine

## 2022-09-06 ENCOUNTER — Other Ambulatory Visit: Payer: Self-pay | Admitting: Family Medicine

## 2022-09-06 DIAGNOSIS — E7849 Other hyperlipidemia: Secondary | ICD-10-CM

## 2022-09-06 MED ORDER — ROSUVASTATIN CALCIUM 10 MG PO TABS: 10.0000 mg | ORAL_TABLET | Freq: Every day | ORAL | 3 refills | Status: DC

## 2022-09-06 NOTE — Progress Notes (Signed)
Thank you :)

## 2022-10-09 ENCOUNTER — Ambulatory Visit (INDEPENDENT_AMBULATORY_CARE_PROVIDER_SITE_OTHER): Payer: 59 | Admitting: Family Medicine

## 2022-10-09 ENCOUNTER — Encounter: Payer: Self-pay | Admitting: Family Medicine

## 2022-10-09 VITALS — BP 133/78 | HR 70 | Ht 69.0 in | Wt 216.0 lb

## 2022-10-09 DIAGNOSIS — Z122 Encounter for screening for malignant neoplasm of respiratory organs: Secondary | ICD-10-CM | POA: Diagnosis not present

## 2022-10-09 DIAGNOSIS — M5441 Lumbago with sciatica, right side: Secondary | ICD-10-CM | POA: Diagnosis not present

## 2022-10-09 DIAGNOSIS — Z0001 Encounter for general adult medical examination with abnormal findings: Secondary | ICD-10-CM

## 2022-10-09 DIAGNOSIS — G8929 Other chronic pain: Secondary | ICD-10-CM | POA: Diagnosis not present

## 2022-10-09 MED ORDER — CYCLOBENZAPRINE HCL 5 MG PO TABS: 5.0000 mg | ORAL_TABLET | Freq: Three times a day (TID) | ORAL | 0 refills | Status: DC

## 2022-10-09 NOTE — Assessment & Plan Note (Signed)
Physical exam as documented Counseling is done on healthy lifestyle involving commitment to 150 minutes of exercise per week,  Discussed heart-healthy diet and attaining a healthy weight Patient reports over 20 pack year smoking history He he reports quitting smoking a year ago We will screen the patient for lung cancer given his smoking history

## 2022-10-09 NOTE — Patient Instructions (Addendum)
I appreciate the opportunity to provide care to you today!    Follow up:  3 months   Physical activity helps: Lower your blood glucose, improve your heart health, lower your blood pressure and cholesterol, burn calories to help manage her weight, gave you energy, lower stress, and improve his sleep.  The American diabetes Association (ADA) recommends being active for 2-1/2 hours (150 minutes) or more week.  Exercise for 30 minutes, 5 days a week (150 minutes total)   I recommend reducing your portion size, reducing your sugar, sodium, and  carbohydrates intake and limiting trans and saturated fats in your diet.  I recommend increasing your fiber intake and finding healthy to go snacks that is low in carbs, sugar and fat.  I recommend increasing servings of fruit and vegetables, reducing soda and limiting processed foods from your diet      Please continue to a heart-healthy diet and increase your physical activities. Try to exercise for 44mns at least three times a week.      It was a pleasure to see you and I look forward to continuing to work together on your health and well-being. Please do not hesitate to call the office if you need care or have questions about your care.   Have a wonderful day and week. With Gratitude, GAlvira MondayMSN, FNP-BC

## 2022-10-09 NOTE — Assessment & Plan Note (Signed)
Chronic condition He complains of right-sided low back pain with sciatica Pain is aggravated with prolonged sitting and is relieved with ambulation He rates pain 8 out of 10 He reports taking NSAIDs and Flexeril 5 mg 3 times daily, however he has been out of his Flexeril He denies low back pain red flags Will reinstate Flexeril 5 mg 3 times daily and encourage patient to take

## 2022-10-09 NOTE — Progress Notes (Signed)
Complete physical exam  Patient: Jose Little   DOB: 05-21-1971   51 y.o. Male  MRN: [id number]  Subjective:    Chief Complaint  Patient presents with   Annual Exam    Cpe today, reports back pain would like an xray to be done this is ongoing since 04/09/2022 or more.    Insomnia    Pt reports trouble sleeping, goes to sleep only for like 2 hours and then wakes up and can't go back to sleep. Onset of this began 07/10/2022.    Jose Little is a 51 y.o. male who presents today for a complete physical exam. He reports consuming a general diet.  He plays soft ball once a weeks. His job is very active noting to walk 25,000-30,000 steps daily  He generally feels well. He reports sleeping poorly. He does not have additional problems to discuss today.    Most recent fall risk assessment:    10/09/2022    3:09 PM  Bath in the past year? 0  Number falls in past yr: 0  Injury with Fall? 0  Risk for fall due to : No Fall Risks  Follow up Falls evaluation completed     Most recent depression screenings:    10/09/2022    3:09 PM 04/29/2022    3:57 PM  PHQ 2/9 Scores  PHQ - 2 Score 0 0  PHQ- 9 Score 0     Dental: No current dental problems and No regular dental care   Patient Active Problem List   Diagnosis Date Noted   Chronic right-sided low back pain with right-sided sciatica 10/09/2022   Essential hypertension 04/29/2022   Mixed hyperlipidemia 04/29/2022   Encounter for general adult medical examination with abnormal findings 04/29/2022   Leg swelling 04/29/2022   Hamstring strain, left, initial encounter 04/29/2022   Former smoker 04/25/2021   Moderate obstructive sleep apnea 02/21/2021   Abdominal mass 02/14/2021   Muscle strain of upper back 02/14/2021   Past Medical History:  Diagnosis Date   Brain tumor [facility])    KNEE, ARTHRITIS, 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   Qualifier: Diagnosis of  By: Aline Brochure MD, Stanley     Rotator cuff tear    Sleep apnea     Past Surgical History:  Procedure Laterality Date   ABDOMINAL SURGERY     BRAIN SURGERY     CHOLECYSTECTOMY     COLONOSCOPY N/A 08/06/2021   Procedure: COLONOSCOPY;  Surgeon: Daneil Dolin, MD;  Location: [facility];  Service: Endoscopy;  Laterality: N/A;  ASA II / 7:30   POLYPECTOMY  08/06/2021   Procedure: POLYPECTOMY;  Surgeon: Daneil Dolin, MD;  Location: [facility];  Service: Endoscopy;;   ROTATOR CUFF REPAIR     SINUS SURGERY WITH INSTATRAK     Social History   Tobacco Use   Smoking status: Former    Years: 43.00    Types: Cigarettes    Start date: 01/11/1988    Quit date: 12/15/2020    Years since quitting: 1.8   Smokeless tobacco: Former    Types: Nurse, children's Use: Never used  Substance Use Topics   Alcohol use: No   Drug use: No   Social History   Socioeconomic History   Marital status: Married    Spouse name: Not on file   Number of children: Not on file   Years of education: Not on file   Highest education  level: Not on file  Occupational History   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, broadcasting  Tobacco Use   Smoking status: Former    Years: 43.00    Types: Cigarettes    Start date: 01/11/1988    Quit date: 12/15/2020    Years since quitting: 1.8   Smokeless tobacco: Former    Types: Nurse, children's Use: Never used  Substance and Sexual Activity   Alcohol use: No   Drug use: No   Sexual activity: Yes  Other Topics Concern   Not on file  Social History Narrative   Not on file   Social Determinants of Health   Financial Resource Strain: Not on file  Food Insecurity: Not on file  Transportation Needs: Not on file  Physical Activity: Not on file  Stress: Not on file  Social Connections: Not on file  Intimate Partner Violence: Not on file   Family Status  Relation Name Status   Father  (Not Specified)   Other  (Not Specified)   Mother  (Not Specified)   Family History  Problem Relation Age of  Onset   Cancer Father    COPD Father    Heart failure Other    COPD Mother    No Known Allergies    Patient Care Team: Alvira Monday, FNP as PCP - General (Family Medicine) Gala Romney, Cristopher Estimable, MD as Consulting Physician (Gastroenterology)   Outpatient Medications Prior to Visit  Medication Sig   furosemide (LASIX) 20 MG tablet Take 1 tablet (20 mg total) by mouth daily.   rosuvastatin (CRESTOR) 10 MG tablet Take 1 tablet (10 mg total) by mouth daily.   triamterene-hydrochlorothiazide (DYAZIDE) 37.5-25 MG capsule Take 1 each (1 capsule total) by mouth daily.   [DISCONTINUED] cyclobenzaprine (FLEXERIL) 5 MG tablet TAKE (1) TABLET BY MOUTH TWICE A DAY AS NEEDED.   No facility-administered medications prior to visit.    Review of Systems  Constitutional:  Negative for chills, fever and malaise/fatigue.  HENT:  Negative for congestion and sinus pain.   Eyes:  Negative for pain, discharge and redness.  Respiratory:  Negative for cough, sputum production and shortness of breath.   Cardiovascular:  Negative for chest pain, palpitations, claudication and leg swelling.  Gastrointestinal:  Negative for diarrhea, heartburn and nausea.  Genitourinary:  Negative for flank pain and frequency.  Musculoskeletal:  Positive for back pain. Negative for joint pain.  Skin:  Negative for itching.  Neurological:  Negative for dizziness, seizures and headaches.  Endo/Heme/Allergies:  Negative for environmental allergies.  Psychiatric/Behavioral:  Negative for memory loss. The patient does not have insomnia.        Objective:    BP 133/78   Pulse 70   Ht _0  (1.753 m)   Wt 216 lb 0.6 oz (98 kg)   SpO2 93%   BMI 31.90 kg/m  BP Readings from Last 3 Encounters:  10/09/22 133/78  04/29/22 136/72  04/27/22 133/77   Wt Readings from Last 3 Encounters:  10/09/22 216 lb 0.6 oz (98 kg)  04/29/22 220 lb 12.8 oz (100.2 kg)  04/27/22 220 lb (99.8 kg)      Physical Exam HENT:     Head:  Normocephalic.     Right Ear: External ear normal.     Left Ear: External ear normal.     Nose: No congestion.     Mouth/Throat:     Mouth: Mucous membranes are moist.     Dentition:  Abnormal dentition.  Eyes:     Extraocular Movements: Extraocular movements intact.     Pupils: Pupils are equal, round, and reactive to light.  Cardiovascular:     Rate and Rhythm: Regular rhythm. Bradycardia present.     Heart sounds: No murmur heard. Pulmonary:     Effort: No respiratory distress.     Breath sounds: Normal breath sounds.  Abdominal:     Tenderness: There is no right CVA tenderness or left CVA tenderness.  Musculoskeletal:     Right lower leg: No edema.     Left lower leg: No edema.  Neurological:     Mental Status: He is alert and oriented to person, place, and time.     GCS: GCS eye subscore is 4. GCS verbal subscore is 5. GCS motor subscore is 6.     Cranial Nerves: No facial asymmetry.     Motor: No atrophy.     Coordination: Coordination normal. Finger-Nose-Finger Test normal.     Gait: Gait normal.  Psychiatric:        Judgment: Judgment normal.     No results found for any visits on 10/09/22. Last CBC Lab Results  Component Value Date   WBC 8.9 08/24/2022   HGB 14.8 08/24/2022   HCT 42.3 08/24/2022   MCV 88 08/24/2022   MCH 30.7 08/24/2022   RDW 13.3 08/24/2022   PLT 261 95/18/8416   Last metabolic panel Lab Results  Component Value Date   GLUCOSE 85 08/24/2022   NA 141 08/24/2022   K 4.0 08/24/2022   CL 102 08/24/2022   CO2 27 08/24/2022   BUN 26 (H) 08/24/2022   CREATININE 1.24 08/24/2022   EGFR 71 08/24/2022   CALCIUM 8.9 08/24/2022   PROT 6.3 08/24/2022   ALBUMIN 4.7 08/24/2022   LABGLOB 1.6 08/24/2022   AGRATIO 2.9 (H) 08/24/2022   BILITOT <0.2 08/24/2022   ALKPHOS 82 08/24/2022   AST 15 08/24/2022   ALT 16 08/24/2022   ANIONGAP 4 (L) 04/27/2022   Last lipids Lab Results  Component Value Date   CHOL 176 08/24/2022   HDL 26 (L)  08/24/2022   LDLCALC 102 (H) 08/24/2022   TRIG 283 (H) 08/24/2022   CHOLHDL 6.8 (H) 08/24/2022   Last hemoglobin A1c Lab Results  Component Value Date   HGBA1C 5.4 08/24/2022   Last thyroid functions Lab Results  Component Value Date   TSH 3.420 08/24/2022   Last vitamin D Lab Results  Component Value Date   VD25OH 32.3 08/24/2022   Last vitamin B12 and Folate No results found for: "VITAMINB12", "FOLATE"      Assessment & Plan:    Routine Health Maintenance and Physical Exam  Immunization History  Administered Date(s) Administered   Tdap 07/17/2014    Health Maintenance  Topic Date Due   COVID-19 Vaccine (1) Never done   Zoster Vaccines- Shingrix (1 of 2) 01/08/2023 (Originally 06/19/2022)   INFLUENZA VACCINE  01/10/2023 (Originally 05/12/2022)   DTaP/Tdap/Td (2 - Td or Tdap) 07/17/2024   COLONOSCOPY (Pts 45-46yr Insurance coverage will need to be confirmed)  08/07/2031   Hepatitis C Screening  Completed   HIV Screening  Completed   HPV VACCINES  Aged Out   Lung Cancer Screening  Discontinued    Discussed health benefits of physical activity, and encouraged him to engage in regular exercise appropriate for his age and condition.  Encounter for general adult medical examination with abnormal findings Assessment & Plan: Physical exam as documented Counseling  is done on healthy lifestyle involving commitment to 150 minutes of exercise per week,  Discussed heart-healthy diet and attaining a healthy weight Patient reports over 20 pack year smoking history He he reports quitting smoking a year ago We will screen the patient for lung cancer given his smoking history        Chronic right-sided low back pain with right-sided sciatica Assessment & Plan: Chronic condition He complains of right-sided low back pain with sciatica Pain is aggravated with prolonged sitting and is relieved with ambulation He rates pain 8 out of 10 He reports taking NSAIDs and Flexeril  5 mg 3 times daily, however he has been out of his Flexeril He denies low back pain red flags Will reinstate Flexeril 5 mg 3 times daily and encourage patient to take  Orders: -     Cyclobenzaprine HCl; Take 1 tablet (5 mg total) by mouth 3 (three) times daily.  Dispense: 90 tablet; Refill: 0  Screening for lung cancer -     CT CHEST LUNG CANCER SCREENING LOW DOSE WO CONTRAST    Return in about 3 months (around 01/08/2023).     Alvira Monday, FNP

## 2022-11-02 ENCOUNTER — Ambulatory Visit (HOSPITAL_COMMUNITY)
Admission: RE | Admit: 2022-11-02 | Discharge: 2022-11-02 | Disposition: A | Discharge status: Home / Self Care | Payer: 59 | Source: Ambulatory Visit | Attending: Family Medicine | Admitting: Family Medicine

## 2022-11-02 DIAGNOSIS — Z122 Encounter for screening for malignant neoplasm of respiratory organs: Secondary | ICD-10-CM | POA: Diagnosis not present

## 2022-11-03 NOTE — Progress Notes (Signed)
Please inform the patient that his training for lung cancer was negative.  Recommend annual screening

## 2022-11-17 ENCOUNTER — Other Ambulatory Visit: Payer: Self-pay | Admitting: Internal Medicine

## 2022-11-17 DIAGNOSIS — I10 Essential (primary) hypertension: Secondary | ICD-10-CM

## 2022-11-17 DIAGNOSIS — M7989 Other specified soft tissue disorders: Secondary | ICD-10-CM

## 2022-11-25 ENCOUNTER — Ambulatory Visit: Payer: 59 | Admitting: Internal Medicine

## 2022-11-27 ENCOUNTER — Encounter: Payer: Self-pay | Admitting: Family Medicine

## 2022-11-27 ENCOUNTER — Ambulatory Visit: Payer: 59 | Admitting: Family Medicine

## 2022-11-30 ENCOUNTER — Other Ambulatory Visit: Payer: Self-pay | Admitting: Family Medicine

## 2022-11-30 DIAGNOSIS — G8929 Other chronic pain: Secondary | ICD-10-CM

## 2022-12-10 ENCOUNTER — Encounter: Payer: Self-pay | Admitting: Radiology

## 2022-12-31 ENCOUNTER — Other Ambulatory Visit: Payer: Self-pay | Admitting: Internal Medicine

## 2022-12-31 DIAGNOSIS — I10 Essential (primary) hypertension: Secondary | ICD-10-CM

## 2022-12-31 DIAGNOSIS — M7989 Other specified soft tissue disorders: Secondary | ICD-10-CM

## 2023-01-08 ENCOUNTER — Ambulatory Visit: Payer: 59 | Admitting: Family Medicine

## 2023-01-09 ENCOUNTER — Other Ambulatory Visit: Payer: Self-pay | Admitting: Family Medicine

## 2023-01-09 DIAGNOSIS — M7989 Other specified soft tissue disorders: Secondary | ICD-10-CM

## 2023-01-09 DIAGNOSIS — I10 Essential (primary) hypertension: Secondary | ICD-10-CM

## 2023-01-22 ENCOUNTER — Ambulatory Visit: Payer: 59 | Admitting: Family Medicine

## 2023-01-22 ENCOUNTER — Encounter: Payer: Self-pay | Admitting: Family Medicine

## 2023-01-22 VITALS — BP 162/94 | HR 74 | Ht 69.0 in | Wt 226.0 lb

## 2023-01-22 DIAGNOSIS — E559 Vitamin D deficiency, unspecified: Secondary | ICD-10-CM

## 2023-01-22 DIAGNOSIS — G8929 Other chronic pain: Secondary | ICD-10-CM | POA: Diagnosis not present

## 2023-01-22 DIAGNOSIS — E7849 Other hyperlipidemia: Secondary | ICD-10-CM

## 2023-01-22 DIAGNOSIS — E0789 Other specified disorders of thyroid: Secondary | ICD-10-CM

## 2023-01-22 DIAGNOSIS — I10 Essential (primary) hypertension: Secondary | ICD-10-CM

## 2023-01-22 DIAGNOSIS — M5441 Lumbago with sciatica, right side: Secondary | ICD-10-CM | POA: Diagnosis not present

## 2023-01-22 DIAGNOSIS — R7301 Impaired fasting glucose: Secondary | ICD-10-CM

## 2023-01-22 MED ORDER — OXYCODONE-ACETAMINOPHEN 5-325 MG PO TABS: 1.0000 | ORAL_TABLET | ORAL | 0 refills | Status: DC | PRN

## 2023-01-22 MED ORDER — METHYLPREDNISOLONE ACETATE 80 MG/ML IJ SUSP
80.0000 mg | Freq: Once | INTRAMUSCULAR | Status: AC
  Administered 2023-01-22: 80 mg via INTRAMUSCULAR

## 2023-01-22 NOTE — Patient Instructions (Addendum)
I appreciate the opportunity to provide care to you today!    Follow up: 2 week to BP,   A refill of your narcotics is sent to your pharmacy Please do not take any NSAID products for the next 6 hours   Referral: Neurosurgery       Please continue to a heart-healthy diet and increase your physical activities. Try to exercise for at least five days a week.      It was a pleasure to see you and I look forward to continuing to work together on your health and well-being. Please do not hesitate to call the office if you need care or have questions about your care.   Have a wonderful day and week. With Gratitude, Gilmore Laroche MSN, FNP-BC

## 2023-01-22 NOTE — Progress Notes (Unsigned)
Established Patient Office Visit  Subjective:  Patient ID: Jose Little, male    DOB: 09/10/1971  Age: 52 y.o. MRN: [id number]  CC:  Chief Complaint  Patient presents with   Follow-up    3 month f/u reports ED visit at [facility] on 01/02/23 due to low back pain, was told its possibly sciatica, pt requesting imaging to be ordered.    Hypertension    Pt reports hot flashes and feeling slightly dizzy at times, bp is elevated.     HPI Jose Little is a 52 y.o. male with past medical history of chronic right-sided low back pain with right-sided sciatica and  essential hypertension presents for f/u of  chronic medical conditions.  10/10  Chronic right-sided low back pain with right-sided sciatica: Denies any recent injury or trauma.denies loss of bowel or bladder control, numbness to groin or lower extremity, weakness to the lower extremity.  Rates pain today 10 out of 10.  He was seen in the ED on 01/02/2023 for worsening of his low back pain.  He was treated with steroid taper, Flexeril, with a short supply of Percocet.  He reports flareup of his symptoms since completing treatment.     Past Medical History:  Diagnosis Date   Brain tumor    KNEE, ARTHRITIS, 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   Qualifier: Diagnosis of  By: Romeo Apple MD, Stanley     Rotator cuff tear    Sleep apnea     Past Surgical History:  Procedure Laterality Date   ABDOMINAL SURGERY     BRAIN SURGERY     CHOLECYSTECTOMY     COLONOSCOPY N/A 08/06/2021   Procedure: COLONOSCOPY;  Surgeon: Corbin Ade, MD;  [facility];  Service: Endoscopy;  Laterality: N/A;  ASA II / 7:30   POLYPECTOMY  08/06/2021   Procedure: POLYPECTOMY;  Surgeon: Corbin Ade, MD;  Location: [facility];  Service: Endoscopy;;   ROTATOR CUFF REPAIR     SINUS SURGERY WITH INSTATRAK      Family History  Problem Relation Age of Onset   Cancer Father    COPD Father    Heart failure Other    COPD Mother     Social History    Socioeconomic History   Marital status: Married    Spouse name: Not on file   Number of children: Not on file   Years of education: Not on file   Highest education level: Not on file  Occupational History   Occupation: Surveyor, 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  Tobacco Use   Smoking status: Former    Years: 43    Types: Cigarettes    Start date: 01/11/1988    Quit date: 12/15/2020    Years since quitting: 2.1   Smokeless tobacco: Former    Types: Associate Professor Use: Never used  Substance and Sexual Activity   Alcohol use: No   Drug use: No   Sexual activity: Yes  Other Topics Concern   Not on file  Social History Narrative   Not on file   Social Determinants of Health   Financial Resource Strain: Not on file  Food Insecurity: Not on file  Transportation Needs: Not on file  Physical Activity: Not on file  Stress: Not on file  Social Connections: Not on file  Intimate Partner Violence: Not on file    Outpatient Medications Prior to Visit  Medication Sig Dispense Refill   furosemide (LASIX) 20  MG tablet Take 1 tablet (20 mg total) by mouth daily. 3 tablet 0   rosuvastatin (CRESTOR) 10 MG tablet Take 1 tablet (10 mg total) by mouth daily. 90 tablet 3   triamterene-hydrochlorothiazide (MAXZIDE-25) 37.5-25 MG tablet TAKE 1 TABLET BY MOUTH ONCE A DAY. 30 tablet 0   cyclobenzaprine (FLEXERIL) 5 MG tablet TAKE (1) TABLET BY MOUTH (3) TIMES DAILY. 90 tablet 0   No facility-administered medications prior to visit.    No Known Allergies  ROS Review of Systems  Constitutional:  Negative for fatigue and fever.  Eyes:  Negative for visual disturbance.  Respiratory:  Negative for chest tightness and shortness of breath.   Cardiovascular:  Negative for chest pain and palpitations.  Musculoskeletal:  Positive for back pain.  Neurological:  Negative for dizziness and headaches.      Objective:    Physical Exam HENT:     Head: Normocephalic.     Right Ear:  External ear normal.     Left Ear: External ear normal.     Nose: No congestion or rhinorrhea.     Mouth/Throat:     Mouth: Mucous membranes are moist.  Cardiovascular:     Rate and Rhythm: Regular rhythm.     Heart sounds: No murmur heard. Pulmonary:     Effort: No respiratory distress.     Breath sounds: Normal breath sounds.  Musculoskeletal:     Lumbar back: Tenderness present. No swelling or deformity. Positive right straight leg raise test.  Neurological:     Mental Status: He is alert.     BP (!) 162/94 (BP Location: Left Arm)   Pulse 74   Ht 5\' 9"  (1.753 m)   Wt 226 lb 0.6 oz (102.5 kg)   SpO2 94%   BMI 33.38 kg/m  Wt Readings from Last 3 Encounters:  01/22/23 226 lb 0.6 oz (102.5 kg)  10/09/22 216 lb 0.6 oz (98 kg)  04/29/22 220 lb 12.8 oz (100.2 kg)    Lab Results  Component Value Date   TSH 3.420 08/24/2022   Lab Results  Component Value Date   WBC 8.9 08/24/2022   HGB 14.8 08/24/2022   HCT 42.3 08/24/2022   MCV 88 08/24/2022   PLT 261 08/24/2022   Lab Results  Component Value Date   NA 141 08/24/2022   K 4.0 08/24/2022   CO2 27 08/24/2022   GLUCOSE 85 08/24/2022   BUN 26 (H) 08/24/2022   CREATININE 1.24 08/24/2022   BILITOT <0.2 08/24/2022   ALKPHOS 82 08/24/2022   AST 15 08/24/2022   ALT 16 08/24/2022   PROT 6.3 08/24/2022   ALBUMIN 4.7 08/24/2022   CALCIUM 8.9 08/24/2022   ANIONGAP 4 (L) 04/27/2022   EGFR 71 08/24/2022   Lab Results  Component Value Date   CHOL 176 08/24/2022   Lab Results  Component Value Date   HDL 26 (L) 08/24/2022   Lab Results  Component Value Date   LDLCALC 102 (H) 08/24/2022   Lab Results  Component Value Date   TRIG 283 (H) 08/24/2022   Lab Results  Component Value Date   CHOLHDL 6.8 (H) 08/24/2022   Lab Results  Component Value Date   HGBA1C 5.4 08/24/2022      Assessment & Plan:  Chronic right-sided low back pain with right-sided sciatica Assessment & Plan: Will treat today in the clinic  with Depo Medrol 80 mg IM in clinic Refill Percocet Referral placed to neurosurgery  Orders: -  CBC with Differential/Platelet -     CMP14+EGFR -     Lipid panel -     VITAMIN D 25 Hydroxy (Vit-D Deficiency, Fractures) -     Hemoglobin A1c -     TSH + free T4 -     Ambulatory referral to Neurosurgery -     methylPREDNISolone Acetate -     oxyCODONE-Acetaminophen; Take 1 tablet by mouth every 4 (four) hours as needed for severe pain.  Dispense: 20 tablet; Refill: 0  Essential hypertension Assessment & Plan: Uncontrolled Likely due to patient's pain level We will follow-up on BP in 2 weeks Encouraged to continue taking Triameterene-HCTZ   Orders: -     CBC with Differential/Platelet -     CMP14+EGFR  Other hyperlipidemia -     Lipid panel  Impaired fasting blood sugar -     Hemoglobin A1c  Other specified disorders of thyroid -     TSH + free T4  Vitamin D deficiency -     VITAMIN D 25 Hydroxy (Vit-D Deficiency, Fractures)   Note: This chart has been completed using Engineer, civil (consulting) software, and while attempts have been made to ensure accuracy, certain words and phrases may not be transcribed as intended.    Follow-up: Return in about 2 weeks (around 02/05/2023).   Gilmore Laroche, FNP

## 2023-01-23 NOTE — Assessment & Plan Note (Signed)
Uncontrolled Likely due to patient's pain level We will follow-up on BP in 2 weeks Encouraged to continue taking Triameterene-HCTZ

## 2023-01-23 NOTE — Assessment & Plan Note (Signed)
Will treat today in the clinic with Depo Medrol 80 mg IM in clinic Refill Percocet Referral placed to neurosurgery

## 2023-01-30 LAB — CMP14+EGFR
ALT: 30 IU/L (ref 0–44)
AST: 27 IU/L (ref 0–40)
Albumin/Globulin Ratio: 2.4 — ABNORMAL HIGH (ref 1.2–2.2)
Albumin: 4.5 g/dL (ref 4.1–5.1)
Alkaline Phosphatase: 86 IU/L (ref 44–121)
BUN/Creatinine Ratio: 18 (ref 9–20)
BUN: 18 mg/dL (ref 6–24)
Bilirubin Total: <0.2 mg/dL (ref 0.0–1.2)
CO2: 24 mmol/L (ref 20–29)
Calcium: 9.1 mg/dL (ref 8.7–10.2)
Chloride: 103 mmol/L (ref 96–106)
Creatinine, Ser: 1 mg/dL (ref 0.76–1.27)
Globulin, Total: 1.9 g/dL (ref 1.5–4.5)
Glucose: 95 mg/dL (ref 70–99)
Potassium: 4.3 mmol/L (ref 3.5–5.2)
Sodium: 141 mmol/L (ref 134–144)
Total Protein: 6.4 g/dL (ref 6.0–8.5)
eGFR: 92 mL/min/{1.73_m2} (ref >59)

## 2023-01-30 LAB — CBC WITH DIFFERENTIAL/PLATELET
Basophils Absolute: 0 10*3/uL (ref 0.0–0.2)
Basos: 1 %
EOS (ABSOLUTE): 0.4 10*3/uL (ref 0.0–0.4)
Eos: 7 %
Hematocrit: 47.3 % (ref 37.5–51.0)
Hemoglobin: 15.8 g/dL (ref 13.0–17.7)
Immature Grans (Abs): 0 10*3/uL (ref 0.0–0.1)
Immature Granulocytes: 0 %
Lymphocytes Absolute: 1.1 10*3/uL (ref 0.7–3.1)
Lymphs: 19 %
MCH: 29.2 pg (ref 26.6–33.0)
MCHC: 33.4 g/dL (ref 31.5–35.7)
MCV: 87 fL (ref 79–97)
Monocytes Absolute: 0.5 10*3/uL (ref 0.1–0.9)
Monocytes: 9 %
Neutrophils Absolute: 3.7 10*3/uL (ref 1.4–7.0)
Neutrophils: 64 %
Platelets: 203 10*3/uL (ref 150–450)
RBC: 5.42 x10E6/uL (ref 4.14–5.80)
RDW: 13.2 % (ref 11.6–15.4)
WBC: 5.7 10*3/uL (ref 3.4–10.8)

## 2023-01-30 LAB — LIPID PANEL
Chol/HDL Ratio: 3.6 ratio (ref 0.0–5.0)
Cholesterol, Total: 144 mg/dL (ref 100–199)
HDL: 40 mg/dL (ref >39)
LDL Chol Calc (NIH): 86 mg/dL (ref 0–99)
Triglycerides: 94 mg/dL (ref 0–149)
VLDL Cholesterol Cal: 18 mg/dL (ref 5–40)

## 2023-01-30 LAB — HEMOGLOBIN A1C
Est. average glucose Bld gHb Est-mCnc: 126 mg/dL
Hgb A1c MFr Bld: 6 % — ABNORMAL HIGH (ref 4.8–5.6)

## 2023-01-30 LAB — TSH+FREE T4
Free T4: 0.78 ng/dL — ABNORMAL LOW (ref 0.82–1.77)
TSH: 1.75 u[IU]/mL (ref 0.450–4.500)

## 2023-01-30 LAB — VITAMIN D 25 HYDROXY (VIT D DEFICIENCY, FRACTURES): Vit D, 25-Hydroxy: 28.5 ng/mL — ABNORMAL LOW (ref 30.0–100.0)

## 2023-02-02 ENCOUNTER — Other Ambulatory Visit: Payer: Self-pay | Admitting: Family Medicine

## 2023-02-02 DIAGNOSIS — E038 Other specified hypothyroidism: Secondary | ICD-10-CM

## 2023-02-05 ENCOUNTER — Ambulatory Visit: Payer: 59 | Admitting: Orthopedic Surgery

## 2023-02-05 ENCOUNTER — Ambulatory Visit: Payer: 59 | Admitting: Family Medicine

## 2023-02-05 ENCOUNTER — Encounter: Payer: Self-pay | Admitting: Family Medicine

## 2023-02-05 VITALS — BP 138/86 | HR 72 | Ht 69.0 in | Wt 220.6 lb

## 2023-02-05 DIAGNOSIS — I10 Essential (primary) hypertension: Secondary | ICD-10-CM | POA: Diagnosis not present

## 2023-02-05 DIAGNOSIS — M7989 Other specified soft tissue disorders: Secondary | ICD-10-CM | POA: Diagnosis not present

## 2023-02-05 MED ORDER — TRIAMTERENE-HCTZ 37.5-25 MG PO TABS: 1.0000 | ORAL_TABLET | Freq: Every day | ORAL | 1 refills | Status: DC

## 2023-02-05 NOTE — Patient Instructions (Signed)
I appreciate the opportunity to provide care to you today!    Follow up:  3 months  Please pick up your refill at the pharmacy and keep up the good work!   Please continue to a heart-healthy diet and increase your physical activities. Try to exercise for at least five days a week.      It was a pleasure to see you and I look forward to continuing to work together on your health and well-being. Please do not hesitate to call the office if you need care or have questions about your care.   Have a wonderful day and week. With Gratitude, Gilmore Laroche MSN, FNP-BC

## 2023-02-05 NOTE — Assessment & Plan Note (Signed)
Controlled Asymptomatic in the clinic Encouraged to continue taking Triameterene-HCTZ 37.5-25 mg daily Low-sodium diet with increased physical activity encouraged BP Readings from Last 3 Encounters:  02/05/23 138/86  01/22/23 (!) 162/94  10/09/22 133/78

## 2023-02-05 NOTE — Progress Notes (Signed)
Established Patient Office Visit  Subjective:  Patient ID: Jose Little, male    DOB: 08-29-1971  Age: 52 y.o. MRN: [id number]  CC:  Chief Complaint  Patient presents with   Hypertension    2 week f/u for bp.    HPI Jose Little is a 52 y.o. male for hypertension f/u. For the details of today's visit, please refer to the assessment and plan.    Past Medical History:  Diagnosis Date   Brain tumor [facility])    KNEE, ARTHRITIS, 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   Qualifier: Diagnosis of  By: Romeo Apple MD, Stanley     Rotator cuff tear    Sleep apnea     Past Surgical History:  Procedure Laterality Date   ABDOMINAL SURGERY     BRAIN SURGERY     CHOLECYSTECTOMY     COLONOSCOPY N/A 08/06/2021   Procedure: COLONOSCOPY;  Surgeon: Corbin Ade, MD;  [facility];  Service: Endoscopy;  Laterality: N/A;  ASA II / 7:30   POLYPECTOMY  08/06/2021   Procedure: POLYPECTOMY;  Surgeon: Corbin Ade, MD;  [facility];  Service: Endoscopy;;   ROTATOR CUFF REPAIR     SINUS SURGERY WITH INSTATRAK      Family History  Problem Relation Age of Onset   Cancer Father    COPD Father    Heart failure Other    COPD Mother     Social History   Socioeconomic History   Marital status: Married    Spouse name: Not on file   Number of children: Not on file   Years of education: Not on file   Highest education level: Not on file  Occupational History   Occupation: Surveyor, 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  Tobacco Use   Smoking status: Former    Years: 43    Types: Cigarettes    Start date: 01/11/1988    Quit date: 12/15/2020    Years since quitting: 2.1   Smokeless tobacco: Former    Types: Associate Professor Use: Never used  Substance and Sexual Activity   Alcohol use: No   Drug use: No   Sexual activity: Yes  Other Topics Concern   Not on file  Social History Narrative   Not on file   Social Determinants of Health   Financial Resource Strain: Not  on file  Food Insecurity: Not on file  Transportation Needs: Not on file  Physical Activity: Not on file  Stress: Not on file  Social Connections: Not on file  Intimate Partner Violence: Not on file    Outpatient Medications Prior to Visit  Medication Sig Dispense Refill   furosemide (LASIX) 20 MG tablet Take 1 tablet (20 mg total) by mouth daily. 3 tablet 0   oxyCODONE-acetaminophen (PERCOCET/ROXICET) 5-325 MG tablet Take 1 tablet by mouth every 4 (four) hours as needed for severe pain. 20 tablet 0   rosuvastatin (CRESTOR) 10 MG tablet Take 1 tablet (10 mg total) by mouth daily. 90 tablet 3   triamterene-hydrochlorothiazide (MAXZIDE-25) 37.5-25 MG tablet TAKE 1 TABLET BY MOUTH ONCE A DAY. 30 tablet 0   No facility-administered medications prior to visit.    No Known Allergies  ROS Review of Systems  Constitutional:  Negative for fatigue and fever.  Eyes:  Negative for visual disturbance.  Respiratory:  Negative for chest tightness and shortness of breath.   Cardiovascular:  Negative for chest pain and palpitations.  Neurological:  Negative  for dizziness and headaches.      Objective:    Physical Exam HENT:     Head: Normocephalic.     Right Ear: External ear normal.     Left Ear: External ear normal.     Nose: No congestion or rhinorrhea.     Mouth/Throat:     Mouth: Mucous membranes are moist.  Cardiovascular:     Rate and Rhythm: Regular rhythm.     Heart sounds: No murmur heard. Pulmonary:     Effort: No respiratory distress.     Breath sounds: Normal breath sounds.  Neurological:     Mental Status: He is alert.     BP 138/86   Pulse 72   Ht 5\' 9"  (1.753 m)   Wt 220 lb 9.6 oz (100.1 kg)   SpO2 98%   BMI 32.58 kg/m  Wt Readings from Last 3 Encounters:  02/05/23 220 lb 9.6 oz (100.1 kg)  01/22/23 226 lb 0.6 oz (102.5 kg)  10/09/22 216 lb 0.6 oz (98 kg)    Lab Results  Component Value Date   TSH 1.750 01/29/2023   Lab Results  Component Value  Date   WBC 5.7 01/29/2023   HGB 15.8 01/29/2023   HCT 47.3 01/29/2023   MCV 87 01/29/2023   PLT 203 01/29/2023   Lab Results  Component Value Date   NA 141 01/29/2023   K 4.3 01/29/2023   CO2 24 01/29/2023   GLUCOSE 95 01/29/2023   BUN 18 01/29/2023   CREATININE 1.00 01/29/2023   BILITOT <0.2 01/29/2023   ALKPHOS 86 01/29/2023   AST 27 01/29/2023   ALT 30 01/29/2023   PROT 6.4 01/29/2023   ALBUMIN 4.5 01/29/2023   CALCIUM 9.1 01/29/2023   ANIONGAP 4 (L) 04/27/2022   EGFR 92 01/29/2023   Lab Results  Component Value Date   CHOL 144 01/29/2023   Lab Results  Component Value Date   HDL 40 01/29/2023   Lab Results  Component Value Date   LDLCALC 86 01/29/2023   Lab Results  Component Value Date   TRIG 94 01/29/2023   Lab Results  Component Value Date   CHOLHDL 3.6 01/29/2023   Lab Results  Component Value Date   HGBA1C 6.0 (H) 01/29/2023      Assessment & Plan:  Essential hypertension Assessment & Plan: Controlled Asymptomatic in the clinic Encouraged to continue taking Triameterene-HCTZ 37.5-25 mg daily Low-sodium diet with increased physical activity encouraged BP Readings from Last 3 Encounters:  02/05/23 138/86  01/22/23 (!) 162/94  10/09/22 133/78      Orders: -     Triamterene-HCTZ; Take 1 tablet by mouth daily.  Dispense: 90 tablet; Refill: 1  Leg swelling -     Triamterene-HCTZ; Take 1 tablet by mouth daily.  Dispense: 90 tablet; Refill: 1    Follow-up: Return in about 3 months (around 05/07/2023).   Gilmore Laroche, FNP

## 2023-02-12 ENCOUNTER — Ambulatory Visit: Payer: 59 | Admitting: Orthopedic Surgery

## 2023-02-19 ENCOUNTER — Ambulatory Visit: Payer: 59 | Admitting: Orthopedic Surgery

## 2023-03-11 NOTE — Progress Notes (Deleted)
Referring Physician:  Gilmore Laroche, FNP [address] [place],  [place] [address]  Primary Physician:  Gilmore Laroche, FNP  History of Present Illness: 03/11/2023*** Jose Little has a history of HTN, OSA, hyperlipidemia, and history of brain tumor.   Seen in ED on 01/02/23 for back and left leg pain- was given flexeril, percocet, and prednisone.       Duration: *** Location: *** Quality: *** Severity: ***  Precipitating: aggravated by *** Modifying factors: made better by *** Weakness: none Timing: *** Bowel/Bladder Dysfunction: none  Conservative measures:  Physical therapy: ***  Multimodal medical therapy including regular antiinflammatories: percocet, flexeril, prednisone Injections: *** epidural steroid injections  Past Surgery: ***  Jose Little has ***no symptoms of cervical myelopathy.  The symptoms are causing a significant impact on the patient's life.   Review of Systems:  A 10 point review of systems is negative, except for the pertinent positives and negatives detailed in the HPI.  Past Medical History: Past Medical History:  Diagnosis Date   Brain tumor [facility])    KNEE, ARTHRITIS, 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   Qualifier: Diagnosis of  By: Romeo Apple MD, Stanley     Rotator cuff tear    Sleep apnea     Past Surgical History: Past Surgical History:  Procedure Laterality Date   ABDOMINAL SURGERY     BRAIN SURGERY     CHOLECYSTECTOMY     COLONOSCOPY N/A 08/06/2021   Procedure: COLONOSCOPY;  Surgeon: Corbin Ade, MD;  [facility];  Service: Endoscopy;  Laterality: N/A;  ASA II / 7:30   POLYPECTOMY  08/06/2021   Procedure: POLYPECTOMY;  Surgeon: Corbin Ade, MD;  [facility];  Service: Endoscopy;;   ROTATOR CUFF REPAIR     SINUS SURGERY WITH INSTATRAK      Allergies: Allergies as of 03/12/2023   (No Known Allergies)    Medications: Outpatient Encounter Medications as of 03/12/2023  Medication Sig    furosemide (LASIX) 20 MG tablet Take 1 tablet (20 mg total) by mouth daily.   oxyCODONE-acetaminophen (PERCOCET/ROXICET) 5-325 MG tablet Take 1 tablet by mouth every 4 (four) hours as needed for severe pain.   rosuvastatin (CRESTOR) 10 MG tablet Take 1 tablet (10 mg total) by mouth daily.   triamterene-hydrochlorothiazide (MAXZIDE-25) 37.5-25 MG tablet Take 1 tablet by mouth daily.   No facility-administered encounter medications on file as of 03/12/2023.    Social History: Social History   Tobacco Use   Smoking status: Former    Years: 43    Types: Cigarettes    Start date: 01/11/1988    Quit date: 12/15/2020    Years since quitting: 2.2   Smokeless tobacco: Former    Types: Associate Professor Use: Never used  Substance Use Topics   Alcohol use: No   Drug use: No    Family Medical History: Family History  Problem Relation Age of Onset   Cancer Father    COPD Father    Heart failure Other    COPD Mother     Physical Examination: There were no vitals filed for this visit.  General: Patient is well developed, well nourished, calm, collected, and in no apparent distress. Attention to examination is appropriate.  Respiratory: Patient is breathing without any difficulty.   NEUROLOGICAL:     Awake, alert, oriented to person, place, and time.  Speech is clear and fluent. Fund of knowledge is appropriate.   Cranial Nerves:  Pupils equal round and reactive to light.  Facial tone is symmetric.    *** ROM of cervical spine *** pain *** posterior cervical tenderness. *** tenderness in bilateral trapezial region.   *** ROM of lumbar spine *** pain *** posterior lumbar tenderness.   No abnormal lesions on exposed skin.   Strength: Side Biceps Triceps Deltoid Interossei Grip Wrist Ext. Wrist Flex.  R 5 5 5 5 5 5 5   L 5 5 5 5 5 5 5    Side Iliopsoas Quads Hamstring PF DF EHL  R 5 5 5 5 5 5   L 5 5 5 5 5 5    Reflexes are ***2+ and symmetric at the biceps, triceps,  brachioradialis, patella and achilles.   Hoffman's is absent.  Clonus is not present.   Bilateral upper and lower extremity sensation is intact to light touch.     Gait is normal.   ***No difficulty with tandem gait.    Medical Decision Making  Imaging: ***  I have personally reviewed the images and agree with the above interpretation.  Assessment and Plan: [name] is a pleasant 52 y.o. male has ***  Treatment options discussed with patient and following plan made:   - Order for physical therapy for *** spine ***. Patient to call to schedule appointment. *** - Continue current medications including ***. Reviewed dosing and side effects.  - Prescription for ***. Reviewed dosing and side effects. Take with food.  - Prescription for *** to take prn muscle spasms. Reviewed dosing and side effects. Discussed this can cause drowsiness.  - MRI of *** to further evaluate *** radiculopathy. No improvement time or medications (***).  - Referral to PMR at [facility] to discuss possible *** injections.  - Will schedule phone visit to review MRI results once I get them back.   I spent a total of *** minutes in face-to-face and non-face-to-face activities related to this patient's care today including review of outside records, review of imaging, review of symptoms, physical exam, discussion of differential diagnosis, discussion of treatment options, and documentation.   Thank you for involving me in the care of this patient.   Drake Leach PA-C Dept. of Neurosurgery

## 2023-03-12 ENCOUNTER — Ambulatory Visit: Payer: 59 | Admitting: Orthopedic Surgery

## 2023-03-18 ENCOUNTER — Encounter: Payer: Self-pay | Admitting: Family Medicine

## 2023-03-18 ENCOUNTER — Ambulatory Visit: Payer: 59 | Admitting: Family Medicine

## 2023-03-18 VITALS — BP 138/85 | HR 78 | Ht 69.0 in | Wt 204.1 lb

## 2023-03-18 DIAGNOSIS — S29012A Strain of muscle and tendon of back wall of thorax, initial encounter: Secondary | ICD-10-CM | POA: Diagnosis not present

## 2023-03-18 MED ORDER — CYCLOBENZAPRINE HCL 5 MG PO TABS: 5.0000 mg | ORAL_TABLET | Freq: Every day | ORAL | 1 refills | Status: DC

## 2023-03-18 MED ORDER — NAPROXEN 500 MG PO TABS: 500.0000 mg | ORAL_TABLET | Freq: Two times a day (BID) | ORAL | 0 refills | Status: AC

## 2023-03-18 NOTE — Patient Instructions (Addendum)
I appreciate the opportunity to provide care to you today!    Follow up:  2 weeks  A prescription for naproxen 500 mg to take twice daily for 2 weeks and Flexeril 5 mg nightly is sent to your pharmacy to start taking  I recommend rest, ice, and heat therapy to the affected site Avoid activities that aggravates or elicit pain in the upper back  Apply heat to the affected area such as a moist heat pack or a heating pad. Place a towel between your skin and the heat source. Leave the heat on for 20-30 minutes. Remove the heat if your skin turns bright red. This is especially important if you are unable to feel pain, heat, or cold. You may have a greater risk of getting burned. Apply  ice on the painful area. To do this: If you have a removable splint, remove it as told by your health care provider. Put ice in a plastic bag. Place a towel between your skin and the bag or between your splint and the bag. Leave the ice on for 20 minutes, 2-3 times a day.    Please report to the ED if your symptoms are worsening or unrelieved with current treatment regiment  Referral: Physical Therapy   Please continue to a heart-healthy diet and increase your physical activities. Try to exercise for at least five days a week.      It was a pleasure to see you and I look forward to continuing to work together on your health and well-being. Please do not hesitate to call the office if you need care or have questions about your care.   Have a wonderful day and week. With Gratitude, Gilmore Laroche MSN, FNP-BC

## 2023-03-18 NOTE — Assessment & Plan Note (Signed)
Tenderness with palpation of the Teres major muscle No recent injury or trauma reported Range of motion of the right shoulder and fingers is intact Sensation intact Neurovascularly intact no numbness or tingling reported No deformity seen Pain is rated 4-5 out of 10 Described as constant and achy Reports taking BC powders The patient works as an Personnel officer No pain with palpation of the right shoulder Will treat today with naproxen 500 mg twice daily and Flexeril 5 mg nightly Referral placed to physical therapy Encouraged not to take BC powder and naproxen simultaneously Will follow-up in 2 weeks Encouraged to report to the ED for worsening of symptoms

## 2023-03-18 NOTE — Progress Notes (Signed)
Established Patient Office Visit  Subjective:  Patient ID: Jose Little, male    DOB: [birth year]  Age: 52 y.o. MRN: [id number]  CC:  Chief Complaint  Patient presents with   Arm Pain    Reports severe pain on right shoulder, hurts worse with movement. Noticed it 3 weeks ago.    HPI Jose Little is a 52 y.o. 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 For the details of today's visit, please refer to the assessment and plan.     Past Medical History:  Diagnosis Date   Brain tumor [facility])    KNEE, ARTHRITIS, 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   Qualifier: Diagnosis of  By: Romeo Apple MD, Stanley     Rotator cuff tear    Sleep apnea     Past Surgical History:  Procedure Laterality Date   ABDOMINAL SURGERY     BRAIN SURGERY     CHOLECYSTECTOMY     COLONOSCOPY N/A 08/06/2021   Procedure: COLONOSCOPY;  Surgeon: Corbin Ade, MD;  [facility];  Service: Endoscopy;  Laterality: N/A;  ASA II / 7:30   POLYPECTOMY  08/06/2021   Procedure: POLYPECTOMY;  Surgeon: Corbin Ade, MD;  [facility];  Service: Endoscopy;;   ROTATOR CUFF REPAIR     SINUS SURGERY WITH INSTATRAK      Family History  Problem Relation Age of Onset   Cancer Father    COPD Father    Heart failure Other    COPD Mother     Social History   Socioeconomic History   Marital status: Married    Spouse name: Not on file   Number of children: Not on file   Years of education: Not on file   Highest education level: Not on file  Occupational History   Occupation: Surveyor, 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  Tobacco Use   Smoking status: Former    Years: 43    Types: Cigarettes    Start date: 01/11/1988    Quit date: 12/15/2020    Years since quitting: 2.2   Smokeless tobacco: Former    Types: Associate Professor Use: Never used   Substance and Sexual Activity   Alcohol use: No   Drug use: No   Sexual activity: Yes  Other Topics Concern   Not on file  Social History Narrative   Not on file   Social Determinants of Health   Financial Resource Strain: Not on file  Food Insecurity: Not on file  Transportation Needs: Not on file  Physical Activity: Not on file  Stress: Not on file  Social Connections: Not on file  Intimate Partner Violence: Not on file    Outpatient Medications Prior to Visit  Medication Sig Dispense Refill   rosuvastatin (CRESTOR) 10 MG tablet Take 1 tablet (10 mg total) by mouth daily. 90 tablet 3   triamterene-hydrochlorothiazide (MAXZIDE-25) 37.5-25 MG tablet Take 1 tablet by mouth daily. 90 tablet 1   furosemide (LASIX) 20 MG tablet Take 1 tablet (20 mg total) by mouth daily. (Patient not taking: Reported on 03/18/2023) 3 tablet 0   oxyCODONE-acetaminophen (PERCOCET/ROXICET) 5-325 MG tablet Take 1 tablet by mouth every 4 (four) hours as needed for severe pain. (Patient not taking: Reported on 03/18/2023) 20 tablet 0   No facility-administered medications prior to visit.    No Known Allergies  ROS Review of Systems  Constitutional:  Negative for fatigue and fever.  Eyes:  Negative for visual disturbance.  Respiratory:  Negative for chest tightness and shortness of breath.   Cardiovascular:  Negative for chest pain and palpitations.  Musculoskeletal:  Positive for back pain.  Neurological:  Negative for dizziness and headaches.      Objective:    Physical Exam HENT:     Head: Normocephalic.     Right Ear: External ear normal.     Left Ear: External ear normal.     Nose: No congestion or rhinorrhea.     Mouth/Throat:     Mouth: Mucous membranes are moist.  Cardiovascular:     Rate and Rhythm: Regular rhythm.     Heart sounds: No murmur heard. Pulmonary:     Effort: No respiratory distress.     Breath sounds: Normal breath sounds.  Musculoskeletal:        General: Normal  range of motion.       Back:     Comments: Tenderness with palpation of the Teres Major muscle   Neurological:     Mental Status: He is alert.     BP 138/85   Pulse 78   Ht 5\' 9"  (1.753 m)   Wt 204 lb 1.9 oz (92.6 kg)   SpO2 97%   BMI 30.14 kg/m  Wt Readings from Last 3 Encounters:  03/18/23 204 lb 1.9 oz (92.6 kg)  02/05/23 220 lb 9.6 oz (100.1 kg)  01/22/23 226 lb 0.6 oz (102.5 kg)    Lab Results  Component Value Date   TSH 1.750 01/29/2023   Lab Results  Component Value Date   WBC 5.7 01/29/2023   HGB 15.8 01/29/2023   HCT 47.3 01/29/2023   MCV 87 01/29/2023   PLT 203 01/29/2023   Lab Results  Component Value Date   NA 141 01/29/2023   K 4.3 01/29/2023   CO2 24 01/29/2023   GLUCOSE 95 01/29/2023   BUN 18 01/29/2023   CREATININE 1.00 01/29/2023   BILITOT <0.2 01/29/2023   ALKPHOS 86 01/29/2023   AST 27 01/29/2023   ALT 30 01/29/2023   PROT 6.4 01/29/2023   ALBUMIN 4.5 01/29/2023   CALCIUM 9.1 01/29/2023   ANIONGAP 4 (L) 04/27/2022   EGFR 92 01/29/2023   Lab Results  Component Value Date   CHOL 144 01/29/2023   Lab Results  Component Value Date   HDL 40 01/29/2023   Lab Results  Component Value Date   LDLCALC 86 01/29/2023   Lab Results  Component Value Date   TRIG 94 01/29/2023   Lab Results  Component Value Date   CHOLHDL 3.6 01/29/2023   Lab Results  Component Value Date   HGBA1C 6.0 (H) 01/29/2023      Assessment & Plan:  Muscle strain of right upper back, initial encounter Assessment & Plan: Tenderness with palpation of the Teres major muscle No recent injury or trauma reported Range of motion of the right shoulder and fingers is intact Sensation intact Neurovascularly intact no numbness or tingling reported No deformity seen Pain is rated 4-5 out of 10 Described as constant and achy Reports taking BC powders The patient works as an Personnel officer No pain with palpation of the right shoulder Will treat today with naproxen  500 mg twice daily and Flexeril 5 mg nightly Referral placed to physical therapy Encouraged not to take BC powder and naproxen simultaneously Will follow-up in 2 weeks Encouraged to report to  the ED for worsening of symptoms   Orders: -     Naproxen; Take 1 tablet (500 mg total) by mouth 2 (two) times daily with a meal for 14 days.  Dispense: 28 tablet; Refill: 0 -     Cyclobenzaprine HCl; Take 1 tablet (5 mg total) by mouth at bedtime.  Dispense: 30 tablet; Refill: 1 -     Ambulatory referral to Physical Therapy    Follow-up: Return in about 2 weeks (around 04/01/2023).   Gilmore Laroche, FNP

## 2023-03-29 NOTE — Progress Notes (Deleted)
Referring Physician:  Gilmore Laroche, FNP [address] [place],  [place] [address]  Primary Physician:  Jose Laroche, FNP  History of Present Illness: 03/29/2023*** [name] has a history of HTN, OSA, hyperlipidemia.   Seen in ED at [facility] on 01/02/23 for left sided LBP and left leg pain.     Was given percocet, flexeril, and prednisone from ED.       Duration: *** Location: *** Quality: *** Severity: ***  Precipitating: aggravated by *** Modifying factors: made better by *** Weakness: none Timing: *** Bowel/Bladder Dysfunction: none  Conservative measures:  Physical therapy: ***  Multimodal medical therapy including regular antiinflammatories: percocet, flexeril, prednisone  Injections: *** epidural steroid injections  Past Surgery: ***  Jose Little has ***no symptoms of cervical myelopathy.  The symptoms are causing a significant impact on the patient's life.   Review of Systems:  A 10 point review of systems is negative, except for the pertinent positives and negatives detailed in the HPI.  Past Medical History: Past Medical History:  Diagnosis Date   Brain tumor [facility])    KNEE, ARTHRITIS, 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   Qualifier: Diagnosis of  By: Jose Apple MD, Jose Little     Rotator cuff tear    Sleep apnea     Past Surgical History: Past Surgical History:  Procedure Laterality Date   ABDOMINAL SURGERY     BRAIN SURGERY     CHOLECYSTECTOMY     COLONOSCOPY N/A 08/06/2021   Procedure: COLONOSCOPY;  Surgeon: Jose Ade, MD;  Location: [facility];  Service: Endoscopy;  Laterality: N/A;  ASA II / 7:30   POLYPECTOMY  08/06/2021   Procedure: POLYPECTOMY;  Surgeon: Jose Ade, MD;  Location: [facility];  Service: Endoscopy;;   ROTATOR CUFF REPAIR     SINUS SURGERY WITH INSTATRAK      Allergies: Allergies as of 04/02/2023   (No Known Allergies)    Medications: Outpatient Encounter Medications as of 04/02/2023   Medication Sig   cyclobenzaprine (FLEXERIL) 5 MG tablet Take 1 tablet (5 mg total) by mouth at bedtime.   furosemide (LASIX) 20 MG tablet Take 1 tablet (20 mg total) by mouth daily. (Patient not taking: Reported on 03/18/2023)   naproxen (NAPROSYN) 500 MG tablet Take 1 tablet (500 mg total) by mouth 2 (two) times daily with a meal for 14 days.   oxyCODONE-acetaminophen (PERCOCET/ROXICET) 5-325 MG tablet Take 1 tablet by mouth every 4 (four) hours as needed for severe pain. (Patient not taking: Reported on 03/18/2023)   rosuvastatin (CRESTOR) 10 MG tablet Take 1 tablet (10 mg total) by mouth daily.   triamterene-hydrochlorothiazide (MAXZIDE-25) 37.5-25 MG tablet Take 1 tablet by mouth daily.   No facility-administered encounter medications on file as of 04/02/2023.    Social History: Social History   Tobacco Use   Smoking status: Former    Years: 43    Types: Cigarettes    Start date: 01/11/1988    Quit date: 12/15/2020    Years since quitting: 2.2   Smokeless tobacco: Former    Types: Associate Professor Use: Never used  Substance Use Topics   Alcohol use: No   Drug use: No    Family Medical History: Family History  Problem Relation Age of Onset   Cancer Father    COPD Father    Heart failure Other    COPD Mother     Physical Examination: There were no vitals filed for  this visit.  General: Patient is well developed, well nourished, calm, collected, and in no apparent distress. Attention to examination is appropriate.  Respiratory: Patient is breathing without any difficulty.   NEUROLOGICAL:     Awake, alert, oriented to person, place, and time.  Speech is clear and fluent. Fund of knowledge is appropriate.   Cranial Nerves: Pupils equal round and reactive to light.  Facial tone is symmetric.    *** ROM of cervical spine *** pain *** posterior cervical tenderness. *** tenderness in bilateral trapezial region.   *** ROM of lumbar spine *** pain *** posterior  lumbar tenderness.   No abnormal lesions on exposed skin.   Strength: Side Biceps Triceps Deltoid Interossei Grip Wrist Ext. Wrist Flex.  R 5 5 5 5 5 5 5   L 5 5 5 5 5 5 5    Side Iliopsoas Quads Hamstring PF DF EHL  R 5 5 5 5 5 5   L 5 5 5 5 5 5    Reflexes are ***2+ and symmetric at the biceps, triceps, brachioradialis, patella and achilles.   Hoffman's is absent.  Clonus is not present.   Bilateral upper and lower extremity sensation is intact to light touch.     Gait is normal.   ***No difficulty with tandem gait.    Medical Decision Making  Imaging: No lumbar imaging.   Assessment and Plan: [name] is a pleasant 52 y.o. male has ***  Treatment options discussed with patient and following plan made:   - Order for physical therapy for *** spine ***. Patient to call to schedule appointment. *** - Continue current medications including ***. Reviewed dosing and side effects.  - Prescription for ***. Reviewed dosing and side effects. Take with food.  - Prescription for *** to take prn muscle spasms. Reviewed dosing and side effects. Discussed this can cause drowsiness.  - MRI of *** to further evaluate *** radiculopathy. No improvement time or medications (***).  - Referral to PMR at [facility] to discuss possible *** injections.  - Will schedule phone visit to review MRI results once I get them back.   I spent a total of *** minutes in face-to-face and non-face-to-face activities related to this patient's care today including review of outside records, review of imaging, review of symptoms, physical exam, discussion of differential diagnosis, discussion of treatment options, and documentation.   Thank you for involving me in the care of this patient.   Jose Leach PA-C Dept. of Neurosurgery

## 2023-04-02 ENCOUNTER — Ambulatory Visit: Payer: 59 | Admitting: Orthopedic Surgery

## 2023-04-05 ENCOUNTER — Ambulatory Visit: Payer: 59 | Admitting: Family Medicine

## 2023-04-23 ENCOUNTER — Ambulatory Visit (HOSPITAL_COMMUNITY): Payer: 59

## 2023-04-25 ENCOUNTER — Encounter (HOSPITAL_COMMUNITY): Payer: Self-pay | Admitting: Emergency Medicine

## 2023-04-25 ENCOUNTER — Emergency Department (HOSPITAL_COMMUNITY): Payer: 59

## 2023-04-25 ENCOUNTER — Emergency Department (HOSPITAL_COMMUNITY)
Admission: EM | Admit: 2023-04-25 | Discharge: 2023-04-25 | Disposition: A | Discharge status: Home / Self Care | Payer: 59 | Attending: Emergency Medicine | Admitting: Emergency Medicine

## 2023-04-25 ENCOUNTER — Other Ambulatory Visit: Payer: Self-pay

## 2023-04-25 DIAGNOSIS — I10 Essential (primary) hypertension: Secondary | ICD-10-CM | POA: Insufficient documentation

## 2023-04-25 DIAGNOSIS — R609 Edema, unspecified: Secondary | ICD-10-CM

## 2023-04-25 DIAGNOSIS — Z85841 Personal history of malignant neoplasm of brain: Secondary | ICD-10-CM | POA: Diagnosis not present

## 2023-04-25 DIAGNOSIS — R600 Localized edema: Secondary | ICD-10-CM | POA: Diagnosis present

## 2023-04-25 HISTORY — DX: Essential (primary) hypertension: I10

## 2023-04-25 HISTORY — DX: Hyperlipidemia, unspecified: E78.5

## 2023-04-25 LAB — BASIC METABOLIC PANEL
Anion gap: 4 — ABNORMAL LOW (ref 5–15)
BUN: 22 mg/dL — ABNORMAL HIGH (ref 6–20)
CO2: 27 mmol/L (ref 22–32)
Calcium: 8.4 mg/dL — ABNORMAL LOW (ref 8.9–10.3)
Chloride: 108 mmol/L (ref 98–111)
Creatinine, Ser: 1.1 mg/dL (ref 0.61–1.24)
GFR, Estimated: >60 mL/min (ref >60)
Glucose, Bld: 100 mg/dL — ABNORMAL HIGH (ref 70–99)
Potassium: 4.2 mmol/L (ref 3.5–5.1)
Sodium: 139 mmol/L (ref 135–145)

## 2023-04-25 LAB — HEPATIC FUNCTION PANEL
ALT: 21 U/L (ref 0–44)
AST: 21 U/L (ref 15–41)
Albumin: 3.4 g/dL — ABNORMAL LOW (ref 3.5–5.0)
Alkaline Phosphatase: 73 U/L (ref 38–126)
Bilirubin, Direct: <0.1 mg/dL (ref 0.0–0.2)
Total Bilirubin: 0.3 mg/dL (ref 0.3–1.2)
Total Protein: 5.9 g/dL — ABNORMAL LOW (ref 6.5–8.1)

## 2023-04-25 LAB — CBC
HCT: 42.8 % (ref 39.0–52.0)
Hemoglobin: 14.1 g/dL (ref 13.0–17.0)
MCH: 29 pg (ref 26.0–34.0)
MCHC: 32.9 g/dL (ref 30.0–36.0)
MCV: 87.9 fL (ref 80.0–100.0)
Platelets: 202 10*3/uL (ref 150–400)
RBC: 4.87 MIL/uL (ref 4.22–5.81)
RDW: 14.4 % (ref 11.5–15.5)
WBC: 6.3 10*3/uL (ref 4.0–10.5)
nRBC: 0 % (ref 0.0–0.2)

## 2023-04-25 LAB — BRAIN NATRIURETIC PEPTIDE: B Natriuretic Peptide: 51 pg/mL (ref 0.0–100.0)

## 2023-04-25 NOTE — ED Provider Notes (Signed)
[place] EMERGENCY DEPARTMENT AT [facility] Provider Note   CSN: [id number] Arrival date & time: 04/25/23  1107     History  Chief Complaint  Patient presents with   Edema   HPI Jose Little is a 52 y.o. male with history of brain tumor, hypertension hyperlipidemia presenting for edema.  States he started to have swelling in both of his arms to the right side of his face and his legs on Wednesday after a long day out in the sun.  States the swelling can come and go.  States he has had intermittent trouble breathing but not short of breath.  Denies chest pain.  Denies throat closing sensation.  HPI     Home Medications Prior to Admission medications   Medication Sig Start Date End Date Taking? Authorizing Provider  cyclobenzaprine (FLEXERIL) 5 MG tablet Take 1 tablet (5 mg total) by mouth at bedtime. 03/18/23   Gilmore Laroche, FNP  furosemide (LASIX) 20 MG tablet Take 1 tablet (20 mg total) by mouth daily. Patient not taking: Reported on 03/18/2023 04/27/22   Gilda Crease, MD  oxyCODONE-acetaminophen (PERCOCET/ROXICET) 5-325 MG tablet Take 1 tablet by mouth every 4 (four) hours as needed for severe pain. Patient not taking: Reported on 03/18/2023 01/22/23   Gilmore Laroche, FNP  rosuvastatin (CRESTOR) 10 MG tablet Take 1 tablet (10 mg total) by mouth daily. 09/06/22   Gilmore Laroche, FNP  triamterene-hydrochlorothiazide (MAXZIDE-25) 37.5-25 MG tablet Take 1 tablet by mouth daily. 02/05/23   Gilmore Laroche, FNP      Allergies    Patient has no known allergies.    Review of Systems   See HPI for pertinent positives   Physical Exam   Vitals:   04/25/23 1130  BP: (!) 142/83  Pulse: 62  Resp: 18  Temp: 98.2 F (36.8 C)  SpO2: 96%    CONSTITUTIONAL:  well-appearing, NAD NEURO:  Alert and oriented x 3, CN 3-12 grossly intact EYES:  eyes equal and reactive ENT/NECK:  Supple, no stridor  CARDIO:  Regular rate and rhythm, appears well-perfused  PULM:   No respiratory distress, CTAB GI/GU:  non-distended, soft MSK/SPINE:  No gross deformities, no edema, moves all extremities  SKIN:  no rash, atraumatic  *Additional and/or pertinent findings included in MDM below    ED Results / Procedures / Treatments   Labs (all labs ordered are listed, but only abnormal results are displayed) Labs Reviewed  BASIC METABOLIC PANEL - Abnormal; Notable for the following components:      Result Value   Glucose, Bld 100 (*)    BUN 22 (*)    Calcium 8.4 (*)    Anion gap 4 (*)    All other components within normal limits  HEPATIC FUNCTION PANEL - Abnormal; Notable for the following components:   Total Protein 5.9 (*)    Albumin 3.4 (*)    All other components within normal limits  CBC  BRAIN NATRIURETIC PEPTIDE    EKG None  Radiology DG Chest 2 View  Result Date: 04/25/2023 CLINICAL DATA:  Right arm pain, edema in both upper extremities EXAM: CHEST - 2 VIEW COMPARISON:  Previous chest radiographs done on 12/15/2020 and CT chest done on 11/02/2022 FINDINGS: Transverse diameter of heart is slightly increased. There are no signs of alveolar pulmonary edema. Linear densities are seen in the lower lung fields. There is poor inspiration. There is no pleural effusion or pneumothorax. IMPRESSION: Subtle increase in markings in the lower lung  fields may suggest a crowding of bronchovascular structures due to poor inspiration or subsegmental atelectasis/pneumonia. There is no focal pulmonary consolidation. There is no pleural effusion or pneumothorax. Electronically Signed   By: Ernie Avena M.D.   On: 04/25/2023 11:51    Procedures Procedures    Medications Ordered in ED Medications - No data to display  ED Course/ Medical Decision Making/ A&P                             Medical Decision Making Amount and/or Complexity of Data Reviewed Labs: ordered. Radiology: ordered.   52 year old well-appearing male presenting for edema of his hands  legs and face.  Exam was unremarkable and did not appear edematous.  DDx includes CHF exacerbation, renal failure, anaphylaxis, heat edema, hypoalbuminemia, other.  Overall workup is reassuring.  X-ray was negative for acute process.  Labs not reveal acute process.  Suspect heat edema.  Advised conservative treatment at home.  Advised to follow with PCP.  Discussed return precautions.  Vital stable discharge.        Final Clinical Impression(s) / ED Diagnoses Final diagnoses:  Edema, unspecified type    Rx / DC Orders ED Discharge Orders     None         Gareth Eagle, PA-C 04/25/23 1348    Bethann Berkshire, MD 04/26/23 1731

## 2023-04-25 NOTE — Discharge Instructions (Addendum)
Evaluation today was overall reassuring.  Recommend you follow-up with your PCP.  If you have new chest pain, worsening shortness of breath, throat closing sensation or any other concerning symptom please return emerged part for evaluation.

## 2023-04-25 NOTE — ED Triage Notes (Signed)
Pt via POV c/o generalized swelling, worse to bilateral arms and right side of face, since Wednesday. Denies changes to medications. Pt states the right arm is sore/tight due to the swelling. Denies hx CHF. 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

## 2023-04-30 ENCOUNTER — Ambulatory Visit: Payer: 59 | Admitting: Family Medicine

## 2023-05-11 ENCOUNTER — Ambulatory Visit (HOSPITAL_COMMUNITY): Payer: Self-pay | Admitting: Physical Therapy

## 2023-06-01 ENCOUNTER — Ambulatory Visit: Payer: 59 | Admitting: Family Medicine

## 2023-06-02 ENCOUNTER — Encounter: Payer: Self-pay | Admitting: Family Medicine

## 2023-06-11 ENCOUNTER — Ambulatory Visit (HOSPITAL_COMMUNITY): Payer: Self-pay

## 2023-09-16 ENCOUNTER — Other Ambulatory Visit: Payer: Self-pay

## 2023-09-16 DIAGNOSIS — E7849 Other hyperlipidemia: Secondary | ICD-10-CM

## 2023-09-17 ENCOUNTER — Encounter: Payer: Self-pay | Admitting: Family Medicine

## 2023-09-17 ENCOUNTER — Telehealth (INDEPENDENT_AMBULATORY_CARE_PROVIDER_SITE_OTHER): Payer: 59 | Admitting: Family Medicine

## 2023-09-17 VITALS — Ht 69.0 in | Wt 225.0 lb

## 2023-09-17 DIAGNOSIS — E559 Vitamin D deficiency, unspecified: Secondary | ICD-10-CM

## 2023-09-17 DIAGNOSIS — S29012A Strain of muscle and tendon of back wall of thorax, initial encounter: Secondary | ICD-10-CM

## 2023-09-17 DIAGNOSIS — M7989 Other specified soft tissue disorders: Secondary | ICD-10-CM

## 2023-09-17 DIAGNOSIS — I10 Essential (primary) hypertension: Secondary | ICD-10-CM

## 2023-09-17 DIAGNOSIS — S29012D Strain of muscle and tendon of back wall of thorax, subsequent encounter: Secondary | ICD-10-CM | POA: Diagnosis not present

## 2023-09-17 DIAGNOSIS — E782 Mixed hyperlipidemia: Secondary | ICD-10-CM

## 2023-09-17 DIAGNOSIS — E038 Other specified hypothyroidism: Secondary | ICD-10-CM

## 2023-09-17 DIAGNOSIS — R7301 Impaired fasting glucose: Secondary | ICD-10-CM

## 2023-09-17 MED ORDER — TRIAMTERENE-HCTZ 37.5-25 MG PO TABS
1.0000 | ORAL_TABLET | Freq: Every day | ORAL | 1 refills | Status: AC
Start: 2023-09-17 — End: ?

## 2023-09-17 MED ORDER — ROSUVASTATIN CALCIUM 10 MG PO TABS
10.0000 mg | ORAL_TABLET | Freq: Every day | ORAL | 3 refills | Status: AC
Start: 2023-09-17 — End: ?

## 2023-09-17 MED ORDER — CYCLOBENZAPRINE HCL 5 MG PO TABS
5.0000 mg | ORAL_TABLET | Freq: Every day | ORAL | 1 refills | Status: AC
Start: 2023-09-17 — End: ?

## 2023-09-17 NOTE — Assessment & Plan Note (Signed)
Asymptomatic Low-sodium diet with increased physical activity encouraged Refill sent to the pharmacy

## 2023-09-17 NOTE — Progress Notes (Signed)
Virtual Visit via Video Note  I connected with Jose Little on 09/17/23 at  3:40 PM EST by a video enabled telemedicine application and verified that I am speaking with the correct person using two identifiers.  Patient Location: Home Provider Location: Home Office  I discussed the limitations, risks, security, and privacy concerns of performing an evaluation and management service by video and the availability of in person appointments. I also discussed with the patient that there may be a patient responsible charge related to this service. The patient expressed understanding and agreed to proceed.  Subjective: PCP: Gilmore Laroche, FNP  Chief Complaint  Patient presents with   Emesis    Pt reports vomitting since yesterday.    Care Management    Follow up , needs meds refilled.    HPI The patient reports having experienced vomiting yesterday, which has resolved today. He states that he is feeling well and is requesting medication refills  ROS: Per HPI  Current Outpatient Medications:    furosemide (LASIX) 20 MG tablet, Take 1 tablet (20 mg total) by mouth daily., Disp: 3 tablet, Rfl: 0   cyclobenzaprine (FLEXERIL) 5 MG tablet, Take 1 tablet (5 mg total) by mouth at bedtime., Disp: 90 tablet, Rfl: 1   rosuvastatin (CRESTOR) 10 MG tablet, Take 1 tablet (10 mg total) by mouth daily., Disp: 90 tablet, Rfl: 3   triamterene-hydrochlorothiazide (MAXZIDE-25) 37.5-25 MG tablet, Take 1 tablet by mouth daily., Disp: 90 tablet, Rfl: 1  Observations/Objective: Today's Vitals   09/17/23 1504  Weight: 225 lb (102.1 kg)  Height: 5\' 9"  (1.753 m)  PainSc: 0-No pain   Physical Exam Patient is well-developed, well-nourished in no acute distress.  Resting comfortably at home.  Head is normocephalic, atraumatic.  No labored breathing.  Speech is clear and coherent with logical content.  Patient is alert and oriented at baseline.   Assessment and Plan: Mixed hyperlipidemia Assessment  & Plan: The patient takes rosuvastatin 10 mg daily and reports no adverse effects from the medication. I recommended decreasing his intake of greasy, fatty, and starchy foods, along with increasing physical activity. A refill has been sent to his pharmacy   Orders: -     Rosuvastatin Calcium; Take 1 tablet (10 mg total) by mouth daily.  Dispense: 90 tablet; Refill: 3 -     Lipid panel -     CMP14+EGFR -     CBC with Differential/Platelet  Leg swelling Assessment & Plan: Refill sent I recommend daily elevation of the legs for 20-30 minutes above the level of the heart. Additionally, daily use of compression stockings is advised, along with regular exercise and weight reduction. It's also important to refrain from prolonged sitting or standing to alleviate symptoms.    Orders: -     Triamterene-HCTZ; Take 1 tablet by mouth daily.  Dispense: 90 tablet; Refill: 1  Essential hypertension Assessment & Plan: Asymptomatic Low-sodium diet with increased physical activity encouraged Refill sent to the pharmacy  Orders: -     Triamterene-HCTZ; Take 1 tablet by mouth daily.  Dispense: 90 tablet; Refill: 1  Muscle strain of right upper back, initial encounter Assessment & Plan: Refill sent to the pharmacy  Orders: -     Cyclobenzaprine HCl; Take 1 tablet (5 mg total) by mouth at bedtime.  Dispense: 90 tablet; Refill: 1  IFG (impaired fasting glucose) -     Hemoglobin A1c  Vitamin D deficiency -     VITAMIN D 25 Hydroxy (Vit-D Deficiency,  Fractures)  TSH (thyroid-stimulating hormone deficiency) -     TSH + free T4  Note: This chart has been completed using Engineer, civil (consulting) software, and while attempts have been made to ensure accuracy, certain words and phrases may not be transcribed as intended.    Follow Up Instructions: Return in about 3 months (around 12/16/2023).   I discussed the assessment and treatment plan with the patient. The patient was provided an opportunity to  ask questions, and all were answered. The patient agreed with the plan and demonstrated an understanding of the instructions.   The patient was advised to call back or seek an in-person evaluation if the symptoms worsen or if the condition fails to improve as anticipated.  The above assessment and management plan was discussed with the patient. The patient verbalized understanding of and has agreed to the management plan.   Gilmore Laroche, FNP

## 2023-09-17 NOTE — Assessment & Plan Note (Signed)
Refill sent to the pharmacy 

## 2023-09-17 NOTE — Assessment & Plan Note (Signed)
The patient takes rosuvastatin 10 mg daily and reports no adverse effects from the medication. I recommended decreasing his intake of greasy, fatty, and starchy foods, along with increasing physical activity. A refill has been sent to his pharmacy

## 2023-09-17 NOTE — Assessment & Plan Note (Addendum)
Refill sent I recommend daily elevation of the legs for 20-30 minutes above the level of the heart. Additionally, daily use of compression stockings is advised, along with regular exercise and weight reduction. It's also important to refrain from prolonged sitting or standing to alleviate symptoms.

## 2023-10-13 ENCOUNTER — Other Ambulatory Visit: Payer: Self-pay | Admitting: Family Medicine

## 2023-10-13 DIAGNOSIS — E559 Vitamin D deficiency, unspecified: Secondary | ICD-10-CM

## 2023-10-13 LAB — TSH+FREE T4
Free T4: 0.83 ng/dL (ref 0.82–1.77)
TSH: 3.07 u[IU]/mL (ref 0.450–4.500)

## 2023-10-13 LAB — HEMOGLOBIN A1C
Est. average glucose Bld gHb Est-mCnc: 117 mg/dL
Hgb A1c MFr Bld: 5.7 % — ABNORMAL HIGH (ref 4.8–5.6)

## 2023-10-13 LAB — LIPID PANEL
Chol/HDL Ratio: 3.5 {ratio} (ref 0.0–5.0)
Cholesterol, Total: 106 mg/dL (ref 100–199)
HDL: 30 mg/dL — ABNORMAL LOW (ref >39)
LDL Chol Calc (NIH): 55 mg/dL (ref 0–99)
Triglycerides: 112 mg/dL (ref 0–149)
VLDL Cholesterol Cal: 21 mg/dL (ref 5–40)

## 2023-10-13 LAB — CBC WITH DIFFERENTIAL/PLATELET
Basophils Absolute: 0 10*3/uL (ref 0.0–0.2)
Basos: 1 %
EOS (ABSOLUTE): 0.2 10*3/uL (ref 0.0–0.4)
Eos: 4 %
Hematocrit: 49.2 % (ref 37.5–51.0)
Hemoglobin: 16 g/dL (ref 13.0–17.7)
Immature Grans (Abs): 0 10*3/uL (ref 0.0–0.1)
Immature Granulocytes: 0 %
Lymphocytes Absolute: 3.2 10*3/uL — ABNORMAL HIGH (ref 0.7–3.1)
Lymphs: 49 %
MCH: 28.2 pg (ref 26.6–33.0)
MCHC: 32.5 g/dL (ref 31.5–35.7)
MCV: 87 fL (ref 79–97)
Monocytes Absolute: 0.5 10*3/uL (ref 0.1–0.9)
Monocytes: 7 %
Neutrophils Absolute: 2.6 10*3/uL (ref 1.4–7.0)
Neutrophils: 39 %
Platelets: 244 10*3/uL (ref 150–450)
RBC: 5.68 x10E6/uL (ref 4.14–5.80)
RDW: 13.2 % (ref 11.6–15.4)
WBC: 6.5 10*3/uL (ref 3.4–10.8)

## 2023-10-13 LAB — CMP14+EGFR
ALT: 22 [IU]/L (ref 0–44)
AST: 19 [IU]/L (ref 0–40)
Albumin: 4.2 g/dL (ref 3.8–4.9)
Alkaline Phosphatase: 83 [IU]/L (ref 44–121)
BUN/Creatinine Ratio: 18 (ref 9–20)
BUN: 19 mg/dL (ref 6–24)
Bilirubin Total: 0.2 mg/dL (ref 0.0–1.2)
CO2: 25 mmol/L (ref 20–29)
Calcium: 9.1 mg/dL (ref 8.7–10.2)
Chloride: 102 mmol/L (ref 96–106)
Creatinine, Ser: 1.08 mg/dL (ref 0.76–1.27)
Globulin, Total: 1.9 g/dL (ref 1.5–4.5)
Glucose: 95 mg/dL (ref 70–99)
Potassium: 4.2 mmol/L (ref 3.5–5.2)
Sodium: 145 mmol/L — ABNORMAL HIGH (ref 134–144)
Total Protein: 6.1 g/dL (ref 6.0–8.5)
eGFR: 83 mL/min/{1.73_m2} (ref >59)

## 2023-10-13 LAB — VITAMIN D 25 HYDROXY (VIT D DEFICIENCY, FRACTURES): Vit D, 25-Hydroxy: 28.4 ng/mL — ABNORMAL LOW (ref 30.0–100.0)

## 2023-10-13 MED ORDER — VITAMIN D (ERGOCALCIFEROL) 1.25 MG (50000 UNIT) PO CAPS
50000.0000 [IU] | ORAL_CAPSULE | ORAL | 1 refills | Status: DC
Start: 2023-10-13 — End: 2023-11-15

## 2023-10-25 ENCOUNTER — Ambulatory Visit: Payer: 59 | Admitting: Orthopedic Surgery

## 2023-10-25 ENCOUNTER — Ambulatory Visit (INDEPENDENT_AMBULATORY_CARE_PROVIDER_SITE_OTHER): Payer: 59

## 2023-10-25 VITALS — BP 137/88 | HR 88 | Ht 69.0 in | Wt 235.0 lb

## 2023-10-25 DIAGNOSIS — M25562 Pain in left knee: Secondary | ICD-10-CM

## 2023-10-25 DIAGNOSIS — S83282A Other tear of lateral meniscus, current injury, left knee, initial encounter: Secondary | ICD-10-CM

## 2023-10-25 DIAGNOSIS — M25462 Effusion, left knee: Secondary | ICD-10-CM

## 2023-10-25 NOTE — Progress Notes (Signed)
  Subjective:     Patient ID: Jose Little, male   DOB: August 01, 1971, 53 y.o.   MRN: [id number]  53 year old male was using a jackhammer fell landed on his left knee on his feet jarred his knee couple days later had severe swelling which is progressively worsening to the point now he can only bend his knee about 30 degrees he complains of pain despite taking Tylenol  and Advil      Review of Systems  Constitutional:  Negative for fever.  Respiratory:  Negative for shortness of breath.   Cardiovascular:  Negative for chest pain.  Skin: Negative.        Objective:   Physical Exam Vitals and nursing note reviewed.  Constitutional:      Appearance: Normal appearance.  HENT:     Head: Normocephalic and atraumatic.  Eyes:     General: No scleral icterus.       Right eye: No discharge.        Left eye: No discharge.     Extraocular Movements: Extraocular movements intact.     Conjunctiva/sclera: Conjunctivae normal.     Pupils: Pupils are equal, round, and reactive to light.  Cardiovascular:     Rate and Rhythm: Normal rate.     Pulses: Normal pulses.  Skin:    General: Skin is warm and dry.     Capillary Refill: Capillary refill takes less than 2 seconds.  Neurological:     General: No focal deficit present.     Mental Status: He is alert and oriented to person, place, and time.  Psychiatric:        Mood and Affect: Mood normal.        Behavior: Behavior normal.        Thought Content: Thought content normal.        Judgment: Judgment normal.   Left knee large effusion aspirated 55 cc of clear yellow fluid knee range of motion 0 to 30 degrees feel stable in the AP and coronal planes lateral compartment is tender with what can be described as positive McMurray's sign     Assessment:     DG Knee AP/LAT W/Sunrise Left Result Date: 10/25/2023 Left knee images acute left knee pain Left knee no fracture no dislocation large effusion noted.  Alignment normal.  Patella aligned  appropriately. Impression large knee effusion no fracture   Effusion  Lateral meniscus tear    Plan:     Aspiration procedure  MRI left knee  Return after MRI for treatment plan  Ace wrap applied left knee   Procedure note aspiration left knee joint  Verbal consent was obtained to aspirate and inject the left knee joint   Timeout was completed to confirm the site of aspiration and injection  An 18-gauge needle was used to aspirate the left knee joint from a suprapatellar lateral approach.  Vision was made not to inject with any steroid Anesthesia was provided by ethyl chloride and the skin was prepped with alcohol.  After cleaning the skin with alcohol an 18-gauge needle was used to aspirate the right knee joint.  We obtained 55 cc of fluid clear and yellow  There were no complications. A sterile bandage was applied.  With Ace bandage

## 2023-10-25 NOTE — Patient Instructions (Signed)
 Call [phone] to schedule MRI

## 2023-10-25 NOTE — Progress Notes (Signed)
  Intake history:  BP 137/88 Comment: no bp med in 2 days going to pick up today  Pulse 88   Ht 5' 9 (1.753 m)   Wt 235 lb (106.6 kg)   BMI 34.70 kg/m  Body mass index is 34.7 kg/m.    WHAT ARE WE SEEING YOU FOR TODAY?   left knee(s)  How Elzora Cullins has this bothered you? (DOI?DOS?WS?)  7 week(s) ago  Anticoag.  No  Diabetes No  Heart disease No  Hypertension Yes  SMOKING HX Yes  Kidney disease No  Any ALLERGIES ______________________________________________   Treatment:  Have you taken:  Tylenol  Yes  Advil  Yes  Had PT No  Had injection No  Other  _________________________

## 2023-10-25 NOTE — Addendum Note (Signed)
 Addended by: Debby Bud R on: 10/25/2023 03:14 PM   Modules accepted: Orders

## 2023-10-26 ENCOUNTER — Telehealth: Payer: Self-pay | Admitting: Orthopedic Surgery

## 2023-10-26 NOTE — Telephone Encounter (Signed)
 Dr. Mort Sawyers pt - spoke w/the pt, he is requesting pain medication be sent to [facility]

## 2023-10-27 ENCOUNTER — Other Ambulatory Visit: Payer: Self-pay | Admitting: Orthopedic Surgery

## 2023-10-27 MED ORDER — IBUPROFEN 800 MG PO TABS: 800.0000 mg | ORAL_TABLET | Freq: Three times a day (TID) | ORAL | 1 refills | Status: DC | PRN

## 2023-10-30 ENCOUNTER — Ambulatory Visit (HOSPITAL_COMMUNITY)
Admission: RE | Admit: 2023-10-30 | Discharge: 2023-10-30 | Disposition: A | Discharge status: Home / Self Care | Payer: 59 | Source: Ambulatory Visit | Attending: Orthopedic Surgery | Admitting: Orthopedic Surgery

## 2023-10-30 DIAGNOSIS — M25462 Effusion, left knee: Secondary | ICD-10-CM | POA: Insufficient documentation

## 2023-10-30 DIAGNOSIS — S83282A Other tear of lateral meniscus, current injury, left knee, initial encounter: Secondary | ICD-10-CM | POA: Diagnosis present

## 2023-10-30 DIAGNOSIS — M25562 Pain in left knee: Secondary | ICD-10-CM | POA: Diagnosis present

## 2023-11-09 ENCOUNTER — Telehealth: Payer: Self-pay | Admitting: Orthopedic Surgery

## 2023-11-09 NOTE — Telephone Encounter (Signed)
Dr. Mort Sawyers pt - spoke w/the pt, he stated that he got his MRI results in mychart and it says he shouldn't bear weight.  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  He had an appointment on Friday, 1/31.  Does he need an OOW note until he's seen?  Please advise. [phone]

## 2023-11-09 NOTE — Telephone Encounter (Signed)
I do not see that. But if he needs to be out of work that's not a problem, we discussed at office visit and he declined, I called him told him ok to walk on knee but avoid ladders, he states he does need to be out of work provided him a note until he comes back

## 2023-11-10 NOTE — Progress Notes (Signed)
   BP (!) 148/102   Pulse 77   Ht 5' 9.5" (1.765 m)   Wt 235 lb (106.6 kg)   BMI 34.21 kg/m   Body mass index is 34.21 kg/m.  Chief Complaint  Patient presents with   Results    Review MRI left knee    Knee Pain    left    No diagnosis found.  DOI/DOS/ Date:    Unchanged

## 2023-11-12 ENCOUNTER — Ambulatory Visit (INDEPENDENT_AMBULATORY_CARE_PROVIDER_SITE_OTHER): Payer: 59 | Admitting: Orthopedic Surgery

## 2023-11-12 ENCOUNTER — Encounter: Payer: Self-pay | Admitting: Orthopedic Surgery

## 2023-11-12 VITALS — BP 148/102 | HR 77 | Ht 69.5 in | Wt 235.0 lb

## 2023-11-12 DIAGNOSIS — Z01818 Encounter for other preprocedural examination: Secondary | ICD-10-CM | POA: Diagnosis not present

## 2023-11-12 DIAGNOSIS — S83242D Other tear of medial meniscus, current injury, left knee, subsequent encounter: Secondary | ICD-10-CM

## 2023-11-12 NOTE — Progress Notes (Addendum)
FOLLOW-UP OFFICE VISIT   Patient: Jose Little           Date of Birth: [birth year]           MRN: [id number] Visit Date: 11/12/2023 Requested by: Gilmore Laroche, FNP [address] [place],  [place] [address] PCP: Gilmore Laroche, FNP    Encounter Diagnoses  Name Primary?   Pre-op exam Yes   Other tear of medial meniscus, current injury, left knee, subsequent encounter     Chief Complaint  Patient presents with   Results    Review MRI left knee    Knee Pain    left   52 YO MALE  Medial knee pain unresponsive to nonoperative care  Positive MRI  IMPRESSION: 1. Horizontal tear posterior horn of the medial meniscus near the junction of the posterior horn and body is new since the prior MRI. 2. 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 3. Spurring at the patellar tendon attachment to the inferior pole of the patella is new since the prior MRI.     Electronically Signed   By: Drusilla Kanner M.D.   On: 11/08/2023 12:26     ASSESSMENT AND PLAN  MRI was reviewed with the patient after I gave the formal reading of: Has an obvious meniscal tear medially has some chondromalacia of the patella and mild arthritis of the medial compartment   53 year old male with torn medial meniscus that is symptomatic he has some mild arthritis.  He works as an Personnel officer.  He is amenable to arthroscopic surgery to relieve his symptoms and improve his function and activity.  Arthroscopy left knee partial medial meniscectomy  Out of work for 1 week  Postop appointment 3 to 5 days

## 2023-11-12 NOTE — Patient Instructions (Signed)
 Your surgery will be at [facility] by Dr Romeo Apple  The hospital will contact you with a preoperative appointment to discuss Anesthesia.  Please arrive on time or 15 minutes early for the preoperative appointment, they have a very tight schedule if you are late or do not come in your surgery will be cancelled.  The phone number is [phone]. Please bring your medications with you for the appointment. They will tell you the arrival time and medication instructions when you have your preoperative evaluation. Do not wear nail polish the day of your surgery and if you take Phentermine you need to stop this medication ONE WEEK prior to your surgery. If you take Docia Barrier, Jardiance, or Steglatro) - Hold 72 hours before the procedure.  If you take Ozempic,  Mounjaro, Bydureon or Trulicity do not take for 8 days before your surgery. If you take Victoza, Rybelsis, Saxenda or Adlyxi stop 24 hours before the procedure.  Please arrive at the hospital 2 hours before procedure if scheduled at 9:30 or later in the day or at the time the nurse tells you at your preoperative visit.   If you have my chart do not use the time given in my chart use the time given to you by the nurse during your preoperative visit.   Your surgery  time may change. Please be available for phone calls the day of your surgery and the day before. The Short Stay department may need to discuss changes about your surgery time. Not reaching the you could lead to procedure delays and possible cancellation.  You must have a ride home and someone to stay with you for 24 to 48 hours. The person taking you home will receive and sign for the your discharge instructions.  Please be prepared to give your support person's name and telephone number to Central Registration. Dr Romeo Apple will need that name and phone number post procedure.

## 2023-11-18 NOTE — Patient Instructions (Addendum)
 Your procedure is scheduled on: 11/23/2023  Report to [facility] Main Entrance at   8:30  AM.  Call this number if you have problems the morning of surgery: [phone]   Remember:   Do not Eat after midnight, You may have CLEAR liquids until 6:30 am water, tea, Black coffee NO CREAM         No Smoking the morning of surgery  :  Take these medicines the morning of surgery with A SIP OF WATER: Fexeril snd/or Advil  if needed   Do not wear jewelry, make-up or nail polish.  Do not wear lotions, powders, or perfumes. You may wear deodorant.  Do not shave 48 hours prior to surgery. Men may shave face and neck.  Do not bring valuables to the hospital.  Contacts, dentures or bridgework may not be worn into surgery.  Leave suitcase in the car. After surgery it may be brought to your room.  For patients admitted to the hospital, checkout time is 11:00 AM the day of discharge.   Patients discharged the day of surgery will not be allowed to drive home.    Special Instructions: Shower using CHG night before surgery and shower the day of surgery use CHG.  Use special wash - you have one bottle of CHG for all showers.  You should use approximately 1/2 of the bottle for each shower. How to Use Chlorhexidine  at Home in the Shower Chlorhexidine  gluconate (CHG) is a germ-killing (antiseptic) wash that's used to clean the skin. It can get rid of the germs that normally live on the skin and can keep them away for about 24 hours. If you're having surgery, you may be told to shower with CHG at home the night before surgery. This can help lower your risk for infection. To use CHG wash in the shower, follow the steps below. Supplies needed: CHG body wash. Clean washcloth. Clean towel. How to use CHG in the shower Follow these steps unless you're told to use CHG in a different way: Start the shower. Use your normal soap and shampoo to wash your face and hair. Turn off the shower or move out of the shower  stream. Pour CHG onto a clean washcloth. Do not use any type of brush or rough sponge. Start at your neck, washing your body down to your toes. Make sure you: Wash the part of your body where the surgery will be done for at least 1 minute. Do not scrub. Do not use CHG on your head or face unless your health care provider tells you to. If it gets into your ears or eyes, rinse them well with water. Do not wash your genitals with CHG. Wash your back and under your arms. Make sure to wash skin folds. Let the CHG sit on your skin for 1-2 minutes or as long as told. Rinse your entire body in the shower, including all body creases and folds. Turn off the shower. Dry off with a clean towel. Do not put anything on your skin afterward, such as powder, lotion, or perfume. Put on clean clothes or pajamas. If it's the night before surgery, sleep in clean sheets. General tips Use CHG only as told, and follow the instructions on the label. Use the full amount of CHG as told. This is often one bottle. Do not smoke and stay away from flames after using CHG. Your skin may feel sticky after using CHG. This is normal. The sticky feeling will go away as  the CHG dries. Do not use CHG: If you have a chlorhexidine  allergy or have reacted to chlorhexidine  in the past. On open wounds or areas of skin that have broken skin, cuts, or scrapes. On babies younger than 91 months of age. Contact a health care provider if: You have questions about using CHG. Your skin gets irritated or itchy. You have a rash after using CHG. You swallow any CHG. Call your local poison control center [phone] in the [place] Your eyes itch badly, or they become very red or swollen. Your hearing changes. You have trouble seeing. If you can't reach your provider, go to an urgent care or emergency room. Do not drive yourself. Get help right away if: You have swelling or tingling in your mouth or throat. You make high-pitched whistling  sounds when you breathe, most often when you breathe out (wheeze). You have trouble breathing. These symptoms may be an emergency. Call 911 right away. Do not wait to see if the symptoms will go away. Do not drive yourself to the hospital. This information is not intended to replace advice given to you by your health care provider. Make sure you discuss any questions you have with your health care provider. Document Revised: 04/13/2023 Document Reviewed: 04/09/2022 Elsevier Patient Education  2024 [employer]. Surgery to See or Treat a Knee Problem (Knee Arthroscopy): What to Know After After knee arthroscopy, it is common to have swelling and pain. You may also have a small amount of fluid coming from the cuts that were made on your knee. Follow these instructions at home: Medicines Take your medicines only as told. You may need to take steps to help treat or prevent trouble pooping (constipation), such as: Taking medicines to help you poop. Eating foods high in fiber, like beans, whole grains, and fresh fruits and vegetables. Drinking more fluids as told. Ask your health care provider if it's safe to drive or use machines while taking your medicine. If you have a brace: Wear it as told. Take it off only if your provider says you can. Check the skin around it every day. Tell your provider if you see problems. Loosen it if your toes tingle, are numb, or turn cold and blue. Keep the brace clean and dry. Bathing Do not take baths, swim, or use a hot tub until you're told it's OK. If your bandage isn't waterproof: Do not let it get wet. Cover it when you take a bath or shower. Use a cover that doesn't let any water in. Incision care  Take care of your cuts from surgery as told. Make sure you: Wash your hands with soap and water for at least 20 seconds before and after you change your bandage. If you can't use soap and water, use hand sanitizer. Change your bandage. Leave stitches or  skin glue alone. Leave tape strips alone unless you're told to take them off. You may trim the edges of the tape strips if they curl up. Check the area around your cuts every day for signs of infection. Check for: Redness. More swelling or pain More fluid or blood. Warmth. Pus or a bad smell. Managing pain, stiffness, and swelling  Use ice or an ice pack as told. If you have a brace that you can take off, remove it only as told. Put ice in a plastic bag or use the cold flow pad or cold therapy unit that you were given. Place a towel between your skin and the bag or  device. Leave the ice on for 20 minutes, 2-3 times a day. If your skin turns red, take off the ice right away to prevent skin damage. The risk of damage is higher if you can't feel pain, heat, or cold. Move your toes often to reduce stiffness and swelling. Raise your leg above the level of your heart while you are sitting or lying down. Use several pillows to keep your leg straight. Do not put a pillow just under the knee. If the knee is bent for a long time, this may lead to stiffness. Activity Rest as told. Get up to take short walks at least every 2 hours during the day. This helps you breathe better and keeps your blood flowing. Ask for help if you feel weak or unsteady. Do not stand or walk on your injured knee until you're told it's OK. Use crutches or a walker. Exercise as told. Avoid high-impact activities, such as running, jumping rope, and doing jumping jacks. Ask what things are safe for you to do at home. Ask when you can go back to work or school. General instructions Ask your provider when it is safe to drive. Do not smoke, vape, or use nicotine  or tobacco. Wear compression stockings to reduce swelling and prevent blood clots in your legs. Keep all follow-up visits. Your provider will need to check how your knee is healing. Contact a health care provider if: You have any signs of infection. You have very bad  pain, and medicine does not help. Your cut from surgery breaks open. You have redness, swelling, pain, or warmth in your calf or at the back of your knee. You have numbness or tingling in your lower leg or your foot. Get help right away if: You have trouble breathing or shortness of breath. You have a fast and irregular heartbeat. You have chest pain. These symptoms may be an emergency. Call 911 right away. Do not wait to see if the symptoms will go away. Do not drive yourself to the hospital. This information is not intended to replace advice given to you by your health care provider. Make sure you discuss any questions you have with your health care provider. Document Revised: 08/27/2023 Document Reviewed: 03/29/2023 Elsevier Patient Education  2024 Elsevier Inc. General Anesthesia, Adult, Care After The following information offers guidance on how to care for yourself after your procedure. Your health care provider may also give you more specific instructions. If you have problems or questions, contact your health care provider. What can I expect after the procedure? After the procedure, it is common for people to: Have pain or discomfort at the IV site. Have nausea or vomiting. Have a sore throat or hoarseness. Have trouble concentrating. Feel cold or chills. Feel weak, sleepy, or tired (fatigue). Have soreness and body aches. These can affect parts of the body that were not involved in surgery. Follow these instructions at home: For the time period you were told by your health care provider:  Rest. Do not participate in activities where you could fall or become injured. Do not drive or use machinery. Do not drink alcohol. Do not take sleeping pills or medicines that cause drowsiness. Do not make important decisions or sign legal documents. Do not take care of children on your own. General instructions Drink enough fluid to keep your urine pale yellow. If you have sleep  apnea, surgery and certain medicines can increase your risk for breathing problems. Follow instructions from your health care provider about  wearing your sleep device: Anytime you are sleeping, including during daytime naps. While taking prescription pain medicines, sleeping medicines, or medicines that make you drowsy. Return to your normal activities as told by your health care provider. Ask your health care provider what activities are safe for you. Take over-the-counter and prescription medicines only as told by your health care provider. Do not use any products that contain nicotine  or tobacco. These products include cigarettes, chewing tobacco, and vaping devices, such as e-cigarettes. These can delay incision healing after surgery. If you need help quitting, ask your health care provider. Contact a health care provider if: You have nausea or vomiting that does not get better with medicine. You vomit every time you eat or drink. You have pain that does not get better with medicine. You cannot urinate or have bloody urine. You develop a skin rash. You have a fever. Get help right away if: You have trouble breathing. You have chest pain. You vomit blood. These symptoms may be an emergency. Get help right away. Call 911. Do not wait to see if the symptoms will go away. Do not drive yourself to the hospital. Summary After the procedure, it is common to have a sore throat, hoarseness, nausea, vomiting, or to feel weak, sleepy, or fatigue. For the time period you were told by your health care provider, do not drive or use machinery. Get help right away if you have difficulty breathing, have chest pain, or vomit blood. These symptoms may be an emergency. This information is not intended to replace advice given to you by your health care provider. Make sure you discuss any questions you have with your health care provider. Document Revised: 12/26/2021 Document Reviewed: 12/26/2021 Elsevier  Patient Education  2024 [employer].

## 2023-11-19 ENCOUNTER — Encounter (HOSPITAL_COMMUNITY)
Admission: RE | Admit: 2023-11-19 | Discharge: 2023-11-19 | Disposition: A | Discharge status: Home / Self Care | Payer: 59 | Source: Ambulatory Visit | Attending: Orthopedic Surgery | Admitting: Orthopedic Surgery

## 2023-11-19 ENCOUNTER — Encounter (HOSPITAL_COMMUNITY): Payer: Self-pay

## 2023-11-19 VITALS — BP 148/88 | HR 77 | Temp 97.8°F | Resp 18 | Ht 69.5 in | Wt 235.0 lb

## 2023-11-19 DIAGNOSIS — S83242D Other tear of medial meniscus, current injury, left knee, subsequent encounter: Secondary | ICD-10-CM | POA: Diagnosis not present

## 2023-11-19 DIAGNOSIS — I10 Essential (primary) hypertension: Secondary | ICD-10-CM | POA: Insufficient documentation

## 2023-11-19 DIAGNOSIS — R9431 Abnormal electrocardiogram [ECG] [EKG]: Secondary | ICD-10-CM | POA: Insufficient documentation

## 2023-11-19 DIAGNOSIS — Z01818 Encounter for other preprocedural examination: Secondary | ICD-10-CM | POA: Insufficient documentation

## 2023-11-19 DIAGNOSIS — X58XXXD Exposure to other specified factors, subsequent encounter: Secondary | ICD-10-CM | POA: Diagnosis not present

## 2023-11-19 LAB — CBC WITH DIFFERENTIAL/PLATELET
Abs Immature Granulocytes: 0.02 10*3/uL (ref 0.00–0.07)
Basophils Absolute: 0 10*3/uL (ref 0.0–0.1)
Basophils Relative: 1 %
Eosinophils Absolute: 0.2 10*3/uL (ref 0.0–0.5)
Eosinophils Relative: 3 %
HCT: 46 % (ref 39.0–52.0)
Hemoglobin: 15.7 g/dL (ref 13.0–17.0)
Immature Granulocytes: 0 %
Lymphocytes Relative: 38 %
Lymphs Abs: 2.1 10*3/uL (ref 0.7–4.0)
MCH: 29.1 pg (ref 26.0–34.0)
MCHC: 34.1 g/dL (ref 30.0–36.0)
MCV: 85.2 fL (ref 80.0–100.0)
Monocytes Absolute: 0.4 10*3/uL (ref 0.1–1.0)
Monocytes Relative: 7 %
Neutro Abs: 2.9 10*3/uL (ref 1.7–7.7)
Neutrophils Relative %: 51 %
Platelets: 190 10*3/uL (ref 150–400)
RBC: 5.4 MIL/uL (ref 4.22–5.81)
RDW: 13.1 % (ref 11.5–15.5)
WBC: 5.7 10*3/uL (ref 4.0–10.5)
nRBC: 0 % (ref 0.0–0.2)

## 2023-11-19 LAB — BASIC METABOLIC PANEL
Anion gap: 10 (ref 5–15)
BUN: 25 mg/dL — ABNORMAL HIGH (ref 6–20)
CO2: 26 mmol/L (ref 22–32)
Calcium: 8.9 mg/dL (ref 8.9–10.3)
Chloride: 104 mmol/L (ref 98–111)
Creatinine, Ser: 1.07 mg/dL (ref 0.61–1.24)
GFR, Estimated: >60 mL/min (ref >60)
Glucose, Bld: 91 mg/dL (ref 70–99)
Potassium: 4 mmol/L (ref 3.5–5.1)
Sodium: 140 mmol/L (ref 135–145)

## 2023-11-19 NOTE — H&P (Addendum)
 FOLLOW-UP OFFICE VISIT    Patient: Jose Little                                      Date of Birth: [birth year]                                                     MRN: [id number] Visit Date: 11/12/2023 Requested by: Zarwolo, Gloria, FNP [address] [place],  [place] [address] PCP: Zarwolo, Gloria, FNP           Encounter Diagnoses  Name Primary?   Pre-op exam Yes   Other tear of medial meniscus, current injury, left knee, subsequent encounter            Chief Complaint  Patient presents with   Results      Review MRI left knee    Knee Pain      left    53 YO MALE  Medial knee pain unresponsive to nonoperative care   Positive MRI   IMPRESSION: 1. Horizontal tear posterior horn of the medial meniscus near the junction of the posterior horn and body is new since the prior MRI. 2. 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 3. Spurring at the patellar tendon attachment to the inferior pole of the patella is new since the prior MRI.     Electronically Signed   By: Debby Prader M.D.   On: 11/08/2023 12:26       ASSESSMENT AND PLAN   MRI was reviewed with the patient after I gave the formal reading of: Has an obvious meniscal tear medially has some chondromalacia of the patella and mild arthritis of the medial compartment     53 year old male with torn medial meniscus that is symptomatic he has some mild arthritis.  He works as an personnel officer.  He is amenable to arthroscopic surgery to relieve his symptoms and improve his function and activity.   Arthroscopy left knee partial medial meniscectomy   Out of work for 1 week   Postop appointment 3 to 5 days  IMPRESSION: 1. Horizontal tear posterior horn of the medial meniscus near the junction of the posterior horn and body is new since the prior MRI. 2. 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 3. Spurring at the patellar tendon attachment to the  inferior pole of the patella is new since the prior MRI.     Electronically Signed   By: Debby Prader M.D.   On: 11/08/2023 12:26   Physical Exam Vitals and nursing note reviewed.  Constitutional:      Appearance: Normal appearance.  HENT:     Head: Normocephalic and atraumatic.  Eyes:     General: No scleral icterus.       Right eye: No discharge.        Left eye: No discharge.     Extraocular Movements: Extraocular movements intact.     Conjunctiva/sclera: Conjunctivae normal.     Pupils: Pupils are equal, round, and reactive to light.  Cardiovascular:     Rate and Rhythm: Normal rate.     Pulses: Normal pulses.     Comments: NO SWELLING OR VARICOSITIES  Musculoskeletal:  Right knee: Normal.     Left knee: Crepitus present. No swelling, deformity, effusion, erythema, ecchymosis or lacerations. Decreased range of motion. Tenderness present over the medial joint line.     Instability Tests: Anterior drawer test negative. Posterior drawer test negative.     Comments: GAIT abnormal   Skin:    General: Skin is warm and dry.     Capillary Refill: Capillary refill takes less than 2 seconds.     Findings: No bruising, erythema or rash.  Neurological:     General: No focal deficit present.     Mental Status: He is alert and oriented to person, place, and time.     Gait: Gait abnormal.     Comments: NORMAL SENSATION IN BOTH LOWER EXTREMITIES   Psychiatric:        Mood and Affect: Mood normal.        Behavior: Behavior normal.        Thought Content: Thought content normal.        Judgment: Judgment normal.      Past Medical History:  Diagnosis Date   Brain tumor (HCC)    Brain tumor (HCC)    Hyperlipidemia    Hypertension    KNEE, ARTHRITIS, 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   Qualifier: Diagnosis of  By: Margrette MD, Bonetta Mostek     Rotator cuff tear    Sleep apnea     Past Surgical History:  Procedure Laterality Date   ABDOMINAL SURGERY     BRAIN SURGERY      CHOLECYSTECTOMY     COLONOSCOPY N/A 08/06/2021   Procedure: COLONOSCOPY;  Surgeon: Shaaron Lamar HERO, MD;  Location: [facility];  Service: Endoscopy;  Laterality: N/A;  ASA II / 7:30   POLYPECTOMY  08/06/2021   Procedure: POLYPECTOMY;  Surgeon: Shaaron Lamar HERO, MD;  Location: [facility];  Service: Endoscopy;;   ROTATOR CUFF REPAIR     SINUS SURGERY WITH INSTATRAK      Social History   Tobacco Use   Smoking status: Former    Current packs/day: 0.00    Types: Cigarettes    Start date: 01/11/1988    Quit date: 12/15/2020    Years since quitting: 2.9   Smokeless tobacco: Former    Types: Associate Professor status: Never Used  Substance Use Topics   Alcohol use: No   Drug use: No   Family History  Problem Relation Age of Onset   Cancer Father    COPD Father    Heart failure Other    COPD Mother    Current Outpatient Medications  Medication Instructions   cyclobenzaprine  (FLEXERIL ) 5 mg, Oral, Daily at bedtime   ibuprofen  (ADVIL ) 800 mg, Every 8 hours PRN   rosuvastatin  (CRESTOR ) 10 mg, Oral, Daily   triamterene -hydrochlorothiazide (MAXZIDE-25) 37.5-25 MG tablet 1 tablet, Oral, Daily    Review of Systems  Constitutional:  Negative for fever.  Respiratory:  Negative for shortness of breath.   Cardiovascular:  Negative for chest pain.  Skin: Negative.   Neurological:  Negative for tingling and sensory change.

## 2023-11-23 ENCOUNTER — Ambulatory Visit (HOSPITAL_COMMUNITY): Payer: 59 | Admitting: Certified Registered"

## 2023-11-23 ENCOUNTER — Encounter (HOSPITAL_COMMUNITY)
Admission: RE | Disposition: A | Discharge status: Home / Self Care | Payer: Self-pay | Source: Home / Self Care | Attending: Orthopedic Surgery

## 2023-11-23 ENCOUNTER — Encounter (HOSPITAL_COMMUNITY): Payer: Self-pay | Admitting: Orthopedic Surgery

## 2023-11-23 ENCOUNTER — Other Ambulatory Visit: Payer: Self-pay

## 2023-11-23 ENCOUNTER — Ambulatory Visit (HOSPITAL_COMMUNITY)
Admission: RE | Admit: 2023-11-23 | Discharge: 2023-11-23 | Disposition: A | Discharge status: Home / Self Care | Payer: 59 | Attending: Orthopedic Surgery | Admitting: Orthopedic Surgery

## 2023-11-23 ENCOUNTER — Ambulatory Visit (HOSPITAL_BASED_OUTPATIENT_CLINIC_OR_DEPARTMENT_OTHER): Payer: 59 | Admitting: Certified Registered"

## 2023-11-23 DIAGNOSIS — M2242 Chondromalacia patellae, left knee: Secondary | ICD-10-CM | POA: Diagnosis not present

## 2023-11-23 DIAGNOSIS — X58XXXA Exposure to other specified factors, initial encounter: Secondary | ICD-10-CM | POA: Insufficient documentation

## 2023-11-23 DIAGNOSIS — G4733 Obstructive sleep apnea (adult) (pediatric): Secondary | ICD-10-CM | POA: Diagnosis not present

## 2023-11-23 DIAGNOSIS — I10 Essential (primary) hypertension: Secondary | ICD-10-CM | POA: Insufficient documentation

## 2023-11-23 DIAGNOSIS — G4730 Sleep apnea, unspecified: Secondary | ICD-10-CM | POA: Insufficient documentation

## 2023-11-23 DIAGNOSIS — S83282A Other tear of lateral meniscus, current injury, left knee, initial encounter: Secondary | ICD-10-CM | POA: Diagnosis not present

## 2023-11-23 DIAGNOSIS — S83242A Other tear of medial meniscus, current injury, left knee, initial encounter: Secondary | ICD-10-CM | POA: Diagnosis present

## 2023-11-23 DIAGNOSIS — Z87891 Personal history of nicotine dependence: Secondary | ICD-10-CM | POA: Insufficient documentation

## 2023-11-23 DIAGNOSIS — M23301 Other meniscus derangements, unspecified lateral meniscus, left knee: Secondary | ICD-10-CM

## 2023-11-23 HISTORY — PX: KNEE ARTHROSCOPY WITH MEDIAL MENISECTOMY: SHX5651

## 2023-11-23 SURGERY — ARTHROSCOPY, KNEE, WITH MEDIAL MENISCECTOMY
Anesthesia: General | Site: Knee | Laterality: Left

## 2023-11-23 MED ORDER — FENTANYL CITRATE PF 50 MCG/ML IJ SOSY
PREFILLED_SYRINGE | INTRAMUSCULAR | Status: AC
Start: 2023-11-23 — End: ?
  Filled 2023-11-23: qty 1

## 2023-11-23 MED ORDER — EPINEPHRINE PF 1 MG/ML IJ SOLN
INTRAMUSCULAR | Status: AC
  Filled 2023-11-23: qty 6

## 2023-11-23 MED ORDER — MIDAZOLAM HCL 2 MG/2ML IJ SOLN
INTRAMUSCULAR | Status: DC | PRN
  Administered 2023-11-23: 2 mg via INTRAVENOUS

## 2023-11-23 MED ORDER — SODIUM CHLORIDE 0.9 % IR SOLN: Status: DC | PRN

## 2023-11-23 MED ORDER — MIDAZOLAM HCL 2 MG/2ML IJ SOLN: 1.0000 mg | Freq: Once | INTRAMUSCULAR | Status: DC | PRN

## 2023-11-23 MED ORDER — MIDAZOLAM HCL 2 MG/2ML IJ SOLN
INTRAMUSCULAR | Status: AC
Start: 2023-11-23 — End: ?
  Filled 2023-11-23: qty 2

## 2023-11-23 MED ORDER — DEXAMETHASONE SODIUM PHOSPHATE 10 MG/ML IJ SOLN
10.0000 mg | Freq: Once | INTRAMUSCULAR | Status: AC
  Administered 2023-11-23: 5 mg via INTRAVENOUS

## 2023-11-23 MED ORDER — HYDROCODONE-ACETAMINOPHEN 10-325 MG PO TABS: 1.0000 | ORAL_TABLET | ORAL | 0 refills | Status: AC | PRN

## 2023-11-23 MED ORDER — PROPOFOL 10 MG/ML IV BOLUS
INTRAVENOUS | Status: AC
  Filled 2023-11-23: qty 20

## 2023-11-23 MED ORDER — FENTANYL CITRATE PF 50 MCG/ML IJ SOSY
25.0000 ug | PREFILLED_SYRINGE | INTRAMUSCULAR | Status: DC | PRN
  Administered 2023-11-23 (×2): 50 ug via INTRAVENOUS
  Filled 2023-11-23: qty 1

## 2023-11-23 MED ORDER — LACTATED RINGERS IV SOLN: INTRAVENOUS | Status: DC | PRN

## 2023-11-23 MED ORDER — ONDANSETRON HCL 4 MG/2ML IJ SOLN: 4.0000 mg | Freq: Four times a day (QID) | INTRAMUSCULAR | Status: DC

## 2023-11-23 MED ORDER — LACTATED RINGERS IV SOLN: INTRAVENOUS | Status: DC

## 2023-11-23 MED ORDER — FENTANYL CITRATE (PF) 100 MCG/2ML IJ SOLN
INTRAMUSCULAR | Status: DC | PRN
  Administered 2023-11-23 (×3): 25 ug via INTRAVENOUS

## 2023-11-23 MED ORDER — CHLORHEXIDINE GLUCONATE 0.12 % MT SOLN
15.0000 mL | Freq: Once | OROMUCOSAL | Status: AC
  Administered 2023-11-23: 15 mL via OROMUCOSAL

## 2023-11-23 MED ORDER — OXYCODONE HCL 5 MG PO TABS
ORAL_TABLET | ORAL | Status: AC
  Filled 2023-11-23: qty 1

## 2023-11-23 MED ORDER — DEXAMETHASONE SODIUM PHOSPHATE 10 MG/ML IJ SOLN
INTRAMUSCULAR | Status: DC | PRN
  Administered 2023-11-23: 5 mg via INTRAVENOUS

## 2023-11-23 MED ORDER — OXYCODONE HCL 5 MG PO TABS
5.0000 mg | ORAL_TABLET | ORAL | Status: DC
  Administered 2023-11-23: 5 mg via ORAL

## 2023-11-23 MED ORDER — DEXAMETHASONE SODIUM PHOSPHATE 4 MG/ML IJ SOLN
INTRAMUSCULAR | Status: AC
Start: 2023-11-23 — End: ?
  Filled 2023-11-23: qty 2

## 2023-11-23 MED ORDER — SODIUM CHLORIDE 0.9 % IR SOLN
Status: DC | PRN
  Administered 2023-11-23 (×2): 3000 mL

## 2023-11-23 MED ORDER — PROPOFOL 10 MG/ML IV BOLUS
INTRAVENOUS | Status: DC | PRN
  Administered 2023-11-23: 300 mg via INTRAVENOUS

## 2023-11-23 MED ORDER — CEFAZOLIN SODIUM-DEXTROSE 2-4 GM/100ML-% IV SOLN
2.0000 g | INTRAVENOUS | Status: AC
  Administered 2023-11-23: 2 g via INTRAVENOUS
  Filled 2023-11-23: qty 100

## 2023-11-23 MED ORDER — CHLORHEXIDINE GLUCONATE 0.12 % MT SOLN: 15.0000 mL | Freq: Once | OROMUCOSAL | Status: DC

## 2023-11-23 MED ORDER — MEPERIDINE HCL 50 MG/ML IJ SOLN: 6.2500 mg | INTRAMUSCULAR | Status: DC | PRN

## 2023-11-23 MED ORDER — KETOROLAC TROMETHAMINE 30 MG/ML IJ SOLN
INTRAMUSCULAR | Status: DC | PRN
  Administered 2023-11-23: 30 mg via INTRAVENOUS

## 2023-11-23 MED ORDER — ONDANSETRON HCL 4 MG/2ML IJ SOLN
INTRAMUSCULAR | Status: DC | PRN
Start: 2023-11-23 — End: 2023-11-23
  Administered 2023-11-23: 4 mg via INTRAVENOUS

## 2023-11-23 MED ORDER — BUPIVACAINE-EPINEPHRINE (PF) 0.5% -1:200000 IJ SOLN
INTRAMUSCULAR | Status: DC | PRN
  Administered 2023-11-23: 30 mL via PERINEURAL

## 2023-11-23 MED ORDER — LIDOCAINE HCL (CARDIAC) PF 100 MG/5ML IV SOSY
PREFILLED_SYRINGE | INTRAVENOUS | Status: DC | PRN
  Administered 2023-11-23: 100 mg via INTRATRACHEAL

## 2023-11-23 MED ORDER — BUPIVACAINE-EPINEPHRINE (PF) 0.5% -1:200000 IJ SOLN
INTRAMUSCULAR | Status: AC
  Filled 2023-11-23: qty 30

## 2023-11-23 MED ORDER — HYDROMORPHONE HCL 1 MG/ML IJ SOLN
INTRAMUSCULAR | Status: AC
  Filled 2023-11-23: qty 1

## 2023-11-23 MED ORDER — FENTANYL CITRATE (PF) 100 MCG/2ML IJ SOLN
INTRAMUSCULAR | Status: AC
  Filled 2023-11-23: qty 2

## 2023-11-23 MED ORDER — ORAL CARE MOUTH RINSE: 15.0000 mL | Freq: Once | OROMUCOSAL | Status: DC

## 2023-11-23 MED ORDER — METOCLOPRAMIDE HCL 5 MG/ML IJ SOLN: 10.0000 mg | Freq: Once | INTRAMUSCULAR | Status: DC | PRN

## 2023-11-23 SURGICAL SUPPLY — 39 items
BAG HAMPER (MISCELLANEOUS) ×2 IMPLANT
BLADE SHAVER TORPEDO 4X13 (MISCELLANEOUS) IMPLANT
BNDG ELASTIC 6X5.8 VLCR NS LF (GAUZE/BANDAGES/DRESSINGS) ×2 IMPLANT
CHLORAPREP W/TINT 26 (MISCELLANEOUS) ×2 IMPLANT
COOLER ICEMAN CLASSIC (MISCELLANEOUS) ×2 IMPLANT
COUNTER NDL MAGNETIC 40 RED (SET/KITS/TRAYS/PACK) ×2 IMPLANT
COUNTER NEEDLE MAGNETIC 40 RED (SET/KITS/TRAYS/PACK) ×1
CUFF TRNQT CYL 34X4.125X (TOURNIQUET CUFF) IMPLANT
DRAPE HALF SHEET 40X57 (DRAPES) ×2 IMPLANT
GAUZE SPONGE 4X4 12PLY STRL (GAUZE/BANDAGES/DRESSINGS) ×2 IMPLANT
GAUZE XEROFORM 5X9 LF (GAUZE/BANDAGES/DRESSINGS) ×2 IMPLANT
GLOVE BIOGEL PI IND STRL 7.0 (GLOVE) ×4 IMPLANT
GLOVE SS N UNI LF 8.5 STRL (GLOVE) ×2 IMPLANT
GLOVE SURG POLYISO LF SZ8 (GLOVE) ×2 IMPLANT
GOWN STRL REUS W/TWL LRG LVL3 (GOWN DISPOSABLE) ×2 IMPLANT
GOWN STRL REUS W/TWL XL LVL3 (GOWN DISPOSABLE) ×2 IMPLANT
IV NS IRRIG 3000ML ARTHROMATIC (IV SOLUTION) ×4 IMPLANT
KIT TURNOVER CYSTO (KITS) ×2 IMPLANT
MANIFOLD NEPTUNE II (INSTRUMENTS) ×2 IMPLANT
MARKER SKIN DUAL TIP RULER LAB (MISCELLANEOUS) ×2 IMPLANT
NDL HYPO 18GX1.5 BLUNT FILL (NEEDLE) ×2 IMPLANT
NDL HYPO 21X1.5 SAFETY (NEEDLE) ×2 IMPLANT
NDL SPNL 18GX3.5 QUINCKE PK (NEEDLE) ×2 IMPLANT
NEEDLE HYPO 18GX1.5 BLUNT FILL (NEEDLE) ×1
NEEDLE HYPO 21X1.5 SAFETY (NEEDLE) ×1
NEEDLE SPNL 18GX3.5 QUINCKE PK (NEEDLE) ×1
NS IRRIG 1000ML POUR BTL (IV SOLUTION) ×2 IMPLANT
PACK ARTHROSCOPY WL (CUSTOM PROCEDURE TRAY) ×2 IMPLANT
PAD ABD 5X9 TENDERSORB (GAUZE/BANDAGES/DRESSINGS) ×2 IMPLANT
PAD ARMBOARD 7.5X6 YLW CONV (MISCELLANEOUS) ×2 IMPLANT
PAD COLD SHLDR SM WRAP-ON (PAD) IMPLANT
PADDING CAST COTTON 6X4 STRL (CAST SUPPLIES) ×2 IMPLANT
POSITIONER HEAD 8X9X4 ADT (SOFTGOODS) ×2 IMPLANT
SET ARTHROSCOPY INST (INSTRUMENTS) ×2 IMPLANT
SET BASIN LINEN APH (SET/KITS/TRAYS/PACK) ×2 IMPLANT
SUT 3-0 BLK 1X30 PSL (SUTURE) ×2 IMPLANT
SYR 10ML LL (SYRINGE) ×2 IMPLANT
TUBE CONNECTING 12X1/4 (SUCTIONS) ×2 IMPLANT
TUBING IN/OUT FLOW W/MAIN PUMP (TUBING) ×2 IMPLANT

## 2023-11-23 NOTE — Brief Op Note (Signed)
11/23/2023  11:51 AM  PATIENT:  Jeanmarie Hubert  53 y.o. male  PRE-OPERATIVE DIAGNOSIS:  torn medial meniscus left knee  POST-OPERATIVE DIAGNOSIS:  torn medial meniscus left knee  PROCEDURE:  Procedure(s): KNEE ARTHROSCOPY WITH MEDIAL MENISECTOMY (Left)  SURGEON:  Surgeons and Role:    Vickki Hearing, MD - Primary  PHYSICIAN ASSISTANT:   ASSISTANTS: none   ANESTHESIA:   general  EBL:  none  BLOOD ADMINISTERED:none  DRAINS: none   LOCAL MEDICATIONS USED:  MARCAINE     SPECIMEN:  No Specimen  DISPOSITION OF SPECIMEN:  N/A  COUNTS:  YES  TOURNIQUET:  * Missing tourniquet times found for documented tourniquets in log: [id number] *  DICTATION: .Dragon Dictation  PLAN OF CARE: Discharge to home after PACU  PATIENT DISPOSITION:  PACU - hemodynamically stable.   Delay start of Pharmacological VTE agent (>24hrs) due to surgical blood loss or risk of bleeding: not applicable

## 2023-11-23 NOTE — Anesthesia Procedure Notes (Signed)
Procedure Name: LMA Insertion Date/Time: 11/23/2023 11:04 AM  Performed by: Oletha Cruel, CRNAPre-anesthesia Checklist: Patient identified, Emergency Drugs available, Suction available and Patient being monitored Patient Re-evaluated:Patient Re-evaluated prior to induction Oxygen Delivery Method: Circle system utilized Preoxygenation: Pre-oxygenation with 100% oxygen Induction Type: IV induction Ventilation: Mask ventilation without difficulty LMA: LMA inserted LMA Size: 4.0 Number of attempts: 1 Placement Confirmation: positive ETCO2, CO2 detector and breath sounds checked- equal and bilateral Tube secured with: Tape Dental Injury: Teeth and Oropharynx as per pre-operative assessment  Comments: Atraumatic insertion of LMA # 4. Lips and teeth intact.

## 2023-11-23 NOTE — Interval H&P Note (Signed)
History and Physical Interval Note:  11/23/2023 10:38 AM  Jose Little  has presented today for surgery, with the diagnosis of torn medial meniscus left knee.  The various methods of treatment have been discussed with the patient and family. After consideration of risks, benefits and other options for treatment, the patient has consented to  Procedure(s): KNEE ARTHROSCOPY WITH MEDIAL MENISECTOMY (Left) as a surgical intervention.  The patient's history has been reviewed, patient examined, no change in status, stable for surgery.  I have reviewed the patient's chart and labs.  Questions were answered to the patient's satisfaction.     Fuller Canada

## 2023-11-23 NOTE — Anesthesia Postprocedure Evaluation (Signed)
Anesthesia Post Note  Patient: Jose Little  Procedure(s) Performed: KNEE ARTHROSCOPY WITH PARTIAL LATER MENISECTOMY CHONDROPLASTY OF THE PATELLA (Left: Knee)  Patient location during evaluation: PACU Anesthesia Type: General Level of consciousness: awake and alert Pain management: pain level controlled Vital Signs Assessment: post-procedure vital signs reviewed and stable Respiratory status: spontaneous breathing, nonlabored ventilation and respiratory function stable Cardiovascular status: blood pressure returned to baseline and stable Postop Assessment: no apparent nausea or vomiting Anesthetic complications: no   No notable events documented.   Last Vitals:  Vitals:   11/23/23 1301 11/23/23 1303  BP:  125/84  Pulse:  63  Resp:  17  Temp: 36.6 C 36.6 C  SpO2:  97%    Last Pain:  Vitals:   11/23/23 1303  TempSrc: Oral  PainSc: 2                  Roslynn Amble

## 2023-11-23 NOTE — Op Note (Signed)
11/23/2023  11:51 AM  PATIENT:  Jose Little  53 y.o. male  PRE-OPERATIVE DIAGNOSIS:  torn medial meniscus left knee  POST-OPERATIVE DIAGNOSIS:  torn medial meniscus left knee  PROCEDURE:  Procedure(s): KNEE ARTHROSCOPY WITH MEDIAL MENISECTOMY (Left)  SURGEON:  Surgeons and Role:    Vickki Hearing, MD - Primary  PHYSICIAN ASSISTANT:   ASSISTANTS: none   ANESTHESIA:   general  EBL:  none  BLOOD ADMINISTERED:none  DRAINS: none   LOCAL MEDICATIONS USED:  MARCAINE     SPECIMEN:  No Specimen  DISPOSITION OF SPECIMEN:  N/A  COUNTS:  YES  TOURNIQUET:  * Missing tourniquet times found for documented tourniquets in log: [id number] *  DICTATION: .Dragon Dictation  PLAN OF CARE: Discharge to home after PACU  PATIENT DISPOSITION:  PACU - hemodynamically stable.   Delay start of Pharmacological VTE agent (>24hrs) due to surgical blood loss or risk of bleeding: not applicable    Knee arthroscopy dictation  Orthopaedic Surgery Operative Note (CSN: [id number])  Jose Little  1971-03-28 Date of Surgery: 11/23/2023     Operative findings  MEDIAL - meniscus no tear  -articular surface mild chodral fraying   LATERAL - meniscus posterior horn and anterior horn tear -articular surface normal  PTF   -articular surface chondral fissuring and fragmentation articular surface of the patella  NOTCH  -acl normal -pcl normal     The patient was identified in the preoperative holding area using 2 approved identification mechanisms. The chart was reviewed and updated. The surgical site was confirmed as left knee and marked with an indelible marker.  The patient was taken to the operating room for anesthesia. After successful general anesthesia, hands was used as IV antibiotics.  The patient was placed in the supine position with the (left) the operative extremity in an arthroscopic leg holder and the opposite extremity in a padded leg holder.  The timeout was  executed.  A lateral portal was established with an 11 blade and the scope was introduced into the joint. A diagnostic arthroscopy was performed in circumferential manner examining the entire knee joint. A medial portal was established and the diagnostic arthroscopy was repeated using a probe to palpate intra-articular structures as they were encountered.    The lateral meniscus was resected a motorized shaver. The meniscus was balanced with a combination of a motorized shaver   A chondroplasty was performed on the patella to remove any cartilage that appeared to be ready to separate  The arthroscopic pump was placed on the wash mode and any excess debris was removed from the joint using suction.  30 cc of Marcaine with epinephrine was injected through the arthroscope.  The portals were closed with 3-0 nylon suture.  A sterile bandage, Ace wrap and Cryo/Cuff was placed and the Cryo/Cuff was activated. The patient was taken to the recovery room in stable condition.    POST OP PLAN  WB as tolerated SUTURES OUT IN A WEEK  AROM  BRACE none required

## 2023-11-23 NOTE — Discharge Instructions (Signed)

## 2023-11-23 NOTE — Anesthesia Preprocedure Evaluation (Addendum)
Anesthesia Evaluation  Patient identified by MRN, date of birth, ID band Patient awake    Reviewed: Allergy & Precautions, H&P , NPO status , Patient's Chart, lab work & pertinent test results  Airway Mallampati: II  TM Distance: >3 FB Neck ROM: Full    Dental no notable dental hx. (+) Poor Dentition, Chipped, Missing, Dental Advisory Given   Pulmonary sleep apnea , Current Smoker and Patient abstained from smoking., former smoker   Pulmonary exam normal breath sounds clear to auscultation       Cardiovascular Exercise Tolerance: Good hypertension, Normal cardiovascular exam Rhythm:Regular Rate:Normal     Neuro/Psych  Neuromuscular disease  negative psych ROS   GI/Hepatic negative GI ROS, Neg liver ROS,,,  Endo/Other  negative endocrine ROS    Renal/GU negative Renal ROS  negative genitourinary   Musculoskeletal  (+) Arthritis ,    Abdominal  (+) + obese  Peds negative pediatric ROS (+)  Hematology negative hematology ROS (+)   Anesthesia Other Findings hoarseness  Reproductive/Obstetrics negative OB ROS                             Anesthesia Physical Anesthesia Plan  ASA: 2  Anesthesia Plan: General   Post-op Pain Management:    Induction: Intravenous  PONV Risk Score and Plan: 1  Airway Management Planned: LMA  Additional Equipment:   Intra-op Plan:   Post-operative Plan: Extubation in OR  Informed Consent: I have reviewed the patients History and Physical, chart, labs and discussed the procedure including the risks, benefits and alternatives for the proposed anesthesia with the patient or authorized representative who has indicated his/her understanding and acceptance.       Plan Discussed with: CRNA  Anesthesia Plan Comments:        Anesthesia Quick Evaluation

## 2023-11-23 NOTE — Transfer of Care (Addendum)
Immediate Anesthesia Transfer of Care Note  Patient: Jose Little  Procedure(s) Performed: KNEE ARTHROSCOPY WITH MEDIAL MENISECTOMY (Left: Knee)  Patient Location: PACU  Anesthesia Type:General  Level of Consciousness: drowsy and patient cooperative  Airway & Oxygen Therapy: Patient Spontanous Breathing and Patient connected to nasal cannula oxygen  Post-op Assessment: Report given to RN and Post -op Vital signs reviewed and stable  Post vital signs: Reviewed and stable  Last Vitals:  Vitals Value Taken Time  BP 113/71 11/23/23 1201  Temp 36.5 11/23/23   1200  Pulse 60 11/23/23 1205  Resp 11 11/23/23 1205  SpO2 97 % 11/23/23 1205  Vitals shown include unfiled device data.  Last Pain:  Vitals:   11/23/23 0856  TempSrc: Oral  PainSc: 0-No pain         Complications: No notable events documented.

## 2023-11-24 ENCOUNTER — Encounter (HOSPITAL_COMMUNITY): Payer: Self-pay | Admitting: Orthopedic Surgery

## 2023-11-24 DIAGNOSIS — Z9889 Other specified postprocedural states: Secondary | ICD-10-CM | POA: Insufficient documentation

## 2023-11-29 ENCOUNTER — Encounter: Payer: Self-pay | Admitting: Orthopedic Surgery

## 2023-11-29 ENCOUNTER — Ambulatory Visit (INDEPENDENT_AMBULATORY_CARE_PROVIDER_SITE_OTHER): Payer: 59 | Admitting: Orthopedic Surgery

## 2023-11-29 DIAGNOSIS — Z9889 Other specified postprocedural states: Secondary | ICD-10-CM

## 2023-11-29 NOTE — Patient Instructions (Signed)
Needs note return to work on Monday 12/06/23 full duty

## 2023-11-29 NOTE — Progress Notes (Signed)
   There were no vitals taken for this visit.  There is no height or weight on file to calculate BMI.  Chief Complaint  Patient presents with   Post-op Follow-up    Left knee 11/23/23 improving     Encounter Diagnosis  Name Primary?   S/P left knee arthroscopy Lateral meniscetomy and chondroplasty of patella 11/23/23 Yes    DOI/DOS/ Date: 11/23/23  Improved

## 2023-11-29 NOTE — Progress Notes (Signed)
Post op #1 office Visit Note   Patient: Jose Little           Date of Birth: Mar 11, 1971           MRN: [id number] Visit Date: 11/29/2023 Requested by: Gilmore Laroche, FNP [address] [place],  [place] [address] PCP: Gilmore Laroche, FNP   Encounter Diagnosis  Name Primary?   S/P left knee arthroscopy Lateral meniscetomy and chondroplasty of patella 11/23/23 Yes   Chief Complaint  Patient presents with   Post-op Follow-up    Left knee 11/23/23 improving    No Known Allergies PAIN MED: None [id number] none  Left knee arthroscopy partial lateral meniscectomy chondroplasty doing well  0 to 90 degree range of motion sutures removed without incident    ASSESSMENT AND PLAN:  Advance range of motion exercises as tolerated start home exercise program  May return to work in a week  Return 3 weeks for postop visit #2

## 2023-12-06 ENCOUNTER — Encounter: Payer: 59 | Admitting: Orthopedic Surgery

## 2023-12-27 ENCOUNTER — Encounter: Payer: 59 | Admitting: Orthopedic Surgery

## 2024-01-06 ENCOUNTER — Ambulatory Visit (INDEPENDENT_AMBULATORY_CARE_PROVIDER_SITE_OTHER): Admitting: Orthopedic Surgery

## 2024-01-06 DIAGNOSIS — S83242D Other tear of medial meniscus, current injury, left knee, subsequent encounter: Secondary | ICD-10-CM

## 2024-01-06 DIAGNOSIS — Z9889 Other specified postprocedural states: Secondary | ICD-10-CM

## 2024-01-06 NOTE — Progress Notes (Signed)
   There were no vitals taken for this visit.  There is no height or weight on file to calculate BMI.  Chief Complaint  Patient presents with   Post-op Follow-up    LEFT KNEE 11/23/23    Encounter Diagnoses  Name Primary?   S/P left knee arthroscopy Lateral meniscetomy and chondroplasty of patella 11/23/23 Yes   Other tear of medial meniscus, current injury, left knee, subsequent encounter     DOI/DOS/ Date: 11/23/23  Improved  Nathanel says he is doing well he returned to work no problems his knee looks good he regained his range of motion he does have a little fluid and I recommended he use ice at the end of the day  He is released to follow-up as needed

## 2024-01-06 NOTE — Progress Notes (Signed)
   There were no vitals taken for this visit.  There is no height or weight on file to calculate BMI.  Chief Complaint  Patient presents with   Post-op Follow-up    LEFT KNEE 11/23/23    No diagnosis found.  DOI/DOS/ Date: 11/23/23  Improved
